# Patient Record
Sex: Male | Born: 1950 | Race: Black or African American | Hispanic: No | Smoking: Former smoker
Health system: Southern US, Community
[De-identification: ages and names within clinical notes are randomized; demographics above are authoritative.]

## PROBLEM LIST (undated history)

## (undated) DIAGNOSIS — N400 Enlarged prostate without lower urinary tract symptoms: Secondary | ICD-10-CM

## (undated) DIAGNOSIS — F32A Depression, unspecified: Secondary | ICD-10-CM

## (undated) DIAGNOSIS — I10 Essential (primary) hypertension: Secondary | ICD-10-CM

## (undated) DIAGNOSIS — R0602 Shortness of breath: Secondary | ICD-10-CM

## (undated) DIAGNOSIS — F99 Mental disorder, not otherwise specified: Secondary | ICD-10-CM

## (undated) DIAGNOSIS — F1410 Cocaine abuse, uncomplicated: Secondary | ICD-10-CM

## (undated) DIAGNOSIS — R32 Unspecified urinary incontinence: Secondary | ICD-10-CM

## (undated) DIAGNOSIS — Z5189 Encounter for other specified aftercare: Secondary | ICD-10-CM

## (undated) DIAGNOSIS — Z87448 Personal history of other diseases of urinary system: Secondary | ICD-10-CM

## (undated) DIAGNOSIS — F319 Bipolar disorder, unspecified: Secondary | ICD-10-CM

## (undated) DIAGNOSIS — F329 Major depressive disorder, single episode, unspecified: Secondary | ICD-10-CM

## (undated) DIAGNOSIS — I639 Cerebral infarction, unspecified: Secondary | ICD-10-CM

## (undated) DIAGNOSIS — F419 Anxiety disorder, unspecified: Secondary | ICD-10-CM

## (undated) HISTORY — PX: INCISION AND DRAINAGE OF WOUND: SHX1803

## (undated) HISTORY — PX: INGUINAL HERNIA REPAIR: SUR1180

## (undated) HISTORY — PX: TONSILLECTOMY: SUR1361

---

## 1969-03-30 DIAGNOSIS — Z5189 Encounter for other specified aftercare: Secondary | ICD-10-CM

## 1969-03-30 DIAGNOSIS — IMO0001 Reserved for inherently not codable concepts without codable children: Secondary | ICD-10-CM

## 1969-03-30 HISTORY — DX: Reserved for inherently not codable concepts without codable children: IMO0001

## 1969-03-30 HISTORY — DX: Encounter for other specified aftercare: Z51.89

## 1998-02-01 ENCOUNTER — Emergency Department (HOSPITAL_COMMUNITY): Admission: EM | Admit: 1998-02-01 | Discharge: 1998-02-01 | Payer: Self-pay | Admitting: Emergency Medicine

## 1998-03-30 ENCOUNTER — Emergency Department (HOSPITAL_COMMUNITY): Admission: EM | Admit: 1998-03-30 | Discharge: 1998-03-31 | Payer: Self-pay

## 2001-02-09 ENCOUNTER — Emergency Department (HOSPITAL_COMMUNITY): Admission: EM | Admit: 2001-02-09 | Discharge: 2001-02-09 | Payer: Self-pay

## 2004-04-01 ENCOUNTER — Inpatient Hospital Stay (HOSPITAL_COMMUNITY): Admission: AD | Admit: 2004-04-01 | Discharge: 2004-04-05 | Payer: Self-pay

## 2004-04-01 ENCOUNTER — Encounter: Payer: Self-pay | Admitting: Emergency Medicine

## 2004-04-05 ENCOUNTER — Ambulatory Visit: Payer: Self-pay | Admitting: Physical Medicine & Rehabilitation

## 2004-04-05 ENCOUNTER — Inpatient Hospital Stay (HOSPITAL_COMMUNITY)
Admission: RE | Admit: 2004-04-05 | Discharge: 2004-04-12 | Payer: Self-pay | Admitting: Physical Medicine & Rehabilitation

## 2006-02-09 ENCOUNTER — Emergency Department (HOSPITAL_COMMUNITY): Admission: EM | Admit: 2006-02-09 | Discharge: 2006-02-09 | Payer: Self-pay | Admitting: Emergency Medicine

## 2006-02-15 ENCOUNTER — Emergency Department (HOSPITAL_COMMUNITY): Admission: EM | Admit: 2006-02-15 | Discharge: 2006-02-15 | Payer: Self-pay | Admitting: Family Medicine

## 2007-05-02 ENCOUNTER — Encounter (INDEPENDENT_AMBULATORY_CARE_PROVIDER_SITE_OTHER): Payer: Self-pay | Admitting: *Deleted

## 2007-05-02 ENCOUNTER — Inpatient Hospital Stay (HOSPITAL_COMMUNITY): Admission: EM | Admit: 2007-05-02 | Discharge: 2007-05-06 | Payer: Self-pay | Admitting: Emergency Medicine

## 2007-06-29 ENCOUNTER — Observation Stay (HOSPITAL_COMMUNITY): Admission: EM | Admit: 2007-06-29 | Discharge: 2007-07-01 | Payer: Self-pay | Admitting: *Deleted

## 2009-08-22 ENCOUNTER — Inpatient Hospital Stay (HOSPITAL_COMMUNITY): Admission: EM | Admit: 2009-08-22 | Discharge: 2009-08-25 | Payer: Self-pay | Admitting: Emergency Medicine

## 2010-10-15 LAB — CK TOTAL AND CKMB (NOT AT ARMC)
CK, MB: 2.6 ng/mL (ref 0.3–4.0)
CK, MB: 3.1 ng/mL (ref 0.3–4.0)
CK, MB: 3.3 ng/mL (ref 0.3–4.0)
Relative Index: 1.6 (ref 0.0–2.5)
Total CK: 162 U/L (ref 7–232)
Total CK: 166 U/L (ref 7–232)
Total CK: 191 U/L (ref 7–232)
Total CK: 195 U/L (ref 7–232)

## 2010-10-15 LAB — CBC
HCT: 42.6 % (ref 39.0–52.0)
RBC: 4.88 MIL/uL (ref 4.22–5.81)

## 2010-10-15 LAB — TROPONIN I
Troponin I: 0.01 ng/mL (ref 0.00–0.06)
Troponin I: 0.02 ng/mL (ref 0.00–0.06)

## 2010-10-15 LAB — COMPREHENSIVE METABOLIC PANEL
ALT: 14 U/L (ref 0–53)
AST: 17 U/L (ref 0–37)
Alkaline Phosphatase: 73 U/L (ref 39–117)
Alkaline Phosphatase: 75 U/L (ref 39–117)
BUN: 24 mg/dL — ABNORMAL HIGH (ref 6–23)
CO2: 22 mEq/L (ref 19–32)
CO2: 23 mEq/L (ref 19–32)
Calcium: 8.5 mg/dL (ref 8.4–10.5)
Calcium: 8.8 mg/dL (ref 8.4–10.5)
Chloride: 105 mEq/L (ref 96–112)
GFR calc Af Amer: >60 mL/min (ref >60)
GFR calc non Af Amer: >60 mL/min (ref >60)
GFR calc non Af Amer: >60 mL/min (ref >60)
Glucose, Bld: 82 mg/dL (ref 70–99)
Glucose, Bld: 90 mg/dL (ref 70–99)
Potassium: 3.8 mEq/L (ref 3.5–5.1)
Sodium: 137 mEq/L (ref 135–145)
Total Protein: 7.3 g/dL (ref 6.0–8.3)

## 2010-10-15 LAB — DIFFERENTIAL
Basophils Relative: 1 % (ref 0–1)
Eosinophils Absolute: 0.2 10*3/uL (ref 0.0–0.7)
Monocytes Relative: 7 % (ref 3–12)
Neutro Abs: 3.4 10*3/uL (ref 1.7–7.7)
Neutrophils Relative %: 44 % (ref 43–77)

## 2010-10-15 LAB — PROTIME-INR
INR: 1.03 (ref 0.00–1.49)
Prothrombin Time: 13.4 seconds (ref 11.6–15.2)

## 2010-10-15 LAB — URINALYSIS, ROUTINE W REFLEX MICROSCOPIC
Nitrite: NEGATIVE
Specific Gravity, Urine: 1.029 (ref 1.005–1.030)
Urobilinogen, UA: 1 mg/dL (ref 0.0–1.0)
pH: 6 (ref 5.0–8.0)

## 2010-10-15 LAB — BASIC METABOLIC PANEL
BUN: 15 mg/dL (ref 6–23)
CO2: 27 mEq/L (ref 19–32)
Calcium: 8.9 mg/dL (ref 8.4–10.5)
Chloride: 106 mEq/L (ref 96–112)
Creatinine, Ser: 1.08 mg/dL (ref 0.4–1.5)

## 2010-10-15 LAB — RAPID URINE DRUG SCREEN, HOSP PERFORMED
Amphetamines: NOT DETECTED
Opiates: NOT DETECTED
Tetrahydrocannabinol: NOT DETECTED

## 2010-10-15 LAB — LIPID PANEL
Triglycerides: 93 mg/dL (ref <150)
VLDL: 19 mg/dL (ref 0–40)

## 2010-10-15 LAB — APTT: aPTT: 31 seconds (ref 24–37)
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
## 2010-12-12 NOTE — Discharge Summary (Signed)
NAME:  Dominic Pineda, Dominic Pineda               ACCOUNT NO.:  [id number]   MEDICAL RECORD NO.:  [id number]          PATIENT TYPE:  OBV   LOCATION:  5509                         FACILITY:  [facility]   PHYSICIAN:  Beckey Rutter, MD  DATE OF BIRTH:  1951-03-21   DATE OF ADMISSION:  06/29/2007  DATE OF DISCHARGE:  07/01/2007                               DISCHARGE SUMMARY   PRIMARY CARE PHYSICIAN:  The patient is unassigned to [facility].   CHIEF COMPLAINT ON PRESENTATION:  Confusion, altered mental status.   HISTORY OF PRESENT ILLNESS:  Please refer to the H&P dictated on the day  of admission.   DISCHARGE DIAGNOSIS:  Altered mental status/confusion secondary to  cocaine/crack abuse.   SECONDARY DIAGNOSES:  1. 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  2. Hypertension.  3. Dyslipidemia.  4. Positive lupus anticoagulant.  5. Bipolar disease.  6. History of polytrauma.  7. A 5-mm right anterior cerebral artery aneurysm.   HOSPITAL COURSE:  During hospitalization the patient was monitored with  improvement to his mental status.  Today, the patient is alert,  oriented.  He is not suicidal.  He denied any idea of hurting others or  himself.  His condition was discussed with his friend in the room after  his permission, also was discussed with his sister and his mom on the  phone, number [phone], in regards to his complication after using crack  cocaine.  The family is agreeable and aware of the discharge plan.  The  patient will be discharged today to follow up with his psychiatrist in  [facility] system.   DISCHARGE MEDICATIONS:  1. Aspirin 325 mg daily.  2. Benazepril 20 mg daily.  3. Verapamil 240 mg daily.  4. Zocor 40 mg at bedtime.  5. Effexor 75 mg daily.  6. Tramadol 50 mg t.i.d.   HOSPITAL PROCEDURES:  1. The patient had CT chest without contrast, the impression reading      no intracranial hemorrhage, no mass effect or midline shift,      chronic  infarct change noted in the right MCA territory.  2. Chest x-ray done on June 29, 2007, impression reading low      volumes with vascular crowding and atelectasis.  No edema,      infiltrate or effusion.  3. The patient underwent MRI, impression reading is:      a.     Expected interval evaluation of right MCA infarct without       evidence of hemorrhage or mass effect.      b.     No definite acute ischemia.  Stable chronic small-vessel       disease.      c.     Flow in the right MCA maybe improved since the prior exam on       the basis of T2 follow voids.  Dedicated MRI was not performed.   DISCHARGE PLAN:  Again, the patient was discharged to follow up with his  psychiatrist.  He has problems with compliance.  I tried to engage the  family to  further improve his compliance.      Beckey Rutter, MD  Electronically Signed     EME/MEDQ  D:  07/01/2007  T:  07/01/2007  Job:  [id number]
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
## 2010-12-12 NOTE — H&P (Signed)
NAME:  OSMIN, WELZ               ACCOUNT NO.:  [id number]   MEDICAL RECORD NO.:  [id number]          PATIENT TYPE:  INP   LOCATION:  1827                         FACILITY:  [facility]   PHYSICIAN:  Bevelyn Buckles. Champey, M.D.DATE OF BIRTH:  06-18-1951   DATE OF ADMISSION:  05/02/2007  DATE OF DISCHARGE:                              HISTORY & PHYSICAL   NEUROLOGY STROKE ADMIT NOTE   REASON FOR ADMISSION:  Code stroke.   HISTORY OF PRESENT ILLNESS:  Mr. Bearse is a 60 year old African  American male with multiple medical problems initially presented for two  syncopal-like events. The last event was at 3:45 this morning.  He was  also found to be orthostatic.  His neuro examination was normal at that  time per the emergency room doctor.  The patient came less responsive,  went to  CT scan and when he came  there from the CT scan around 4:15  this morning he had right gaze deviation and left hemiparesis.  Code  Stroke was called.  The patient complained of left-sided headache as  well.  Initially hemiparesis was dense on the left and right gaze  deviation.  The patient denies any swallowing problems, chewing  problems, vertigo or dizziness.   PAST MEDICAL HISTORY:  Positive for hypertension, bipolar, high  cholesterol, back pain.   CURRENT MEDICATIONS:  Glyburide, Ultram, Tramadol, potassium chloride,  Zocor and multivitamin.   ALLERGIES:  To CODEINE.   FAMILY HISTORY:  Is positive for heart disease.   SOCIAL HISTORY:  The patient lives with his sister and brother denies  any smoking.  Does use crack cocaine occasionally, last use was  yesterday, does drink 2 alcohol drinks per day.   REVIEW OF SYSTEMS:  Positive as per HPI.  Review of systems negative as  per HPI in greater than eight other organ system.   EXAMINATION:  Vital Signs: Temperature is 96.8, blood pressure 187/99,  pulse 80, respirations 20, O2 sat is 95-99%.  HEENT: Normocephalic, atraumatic.  The patient has a right  gaze  preference however does cross midline.  He has a left visual field cut.  NECK is supple.  No carotid bruits.  HEART:  Regular.  LUNGS: Clear.  ABDOMEN: Soft.  EXTREMITIES:  Show good pulses.  NEUROLOGICAL EXAMINATION:  The patient is awake yet drowsy.  Language is  dysarthric.  The patient is following commands appropriately.  Cranial  Nerves: The patient has a left facial droop, right gaze preference,  however does cross midline.  He has a left visual field cut.  Motor  examination the patient has mild hemiparesis on the left with a mild  drift.  Sensory examination: The patient has neglect and extinction the  left and decreased sensation on the left when compared to the right.  Reflexes are 1+ throughout.  Toes are neutral bilaterally.  Cerebellar  function: No  ataxia is noted.  Gait is not assessed secondary to  safety.  NIH stroke scale was nine.  CT of the head showed no acute  bleed or infarct.   LABORATORY DATA:  WBC is 10.5, hemoglobin  12.9, hematocrit 37.8,  platelets 250.  PT is 14.4, INR is 1.0, PTT is 33.  Sodium is 139,  potassium is 3.3, chloride is 104, CO2 26, BUN 16, creatinine is 1.27,  glucose 154, AST 35, ALT is 17.   IMPRESSION:  This is a 60 year old [place] American male who initially  presented with syncope and normal neurological examination.  He then  developed acute onset of left hemiparesis and symptoms as above,  suggesting a right hemispheric infarct.  The patient did meet criteria  for IV t-PA and full-dose was given.  The patient was admitted to stroke  MD ICU.  We will get an MRI/MRA of the brain, CT scan in 24 hours and 2-  D echocardiogram, carotid Dopplers.  Check fasting homocysteine levels,  will get PT, OT and speech consults.  Will continue his home  medications.  The patient will need to start an antiplatelet 24 hours  after t-PA.  We will monitor his blood pressure and neuro checks  frequently, keep n.p.o. until he clears swallow  evaluation.      Bevelyn Buckles. Nash Shearer, M.D.  Electronically Signed     DRC/MEDQ  D:  05/02/2007  T:  05/02/2007  Job:  [id number]

## 2010-12-12 NOTE — Op Note (Signed)
Dominic Pineda, Dominic Pineda               ACCOUNT NO.:  [id number]   MEDICAL RECORD NO.:  [id number]          PATIENT TYPE:  INP   LOCATION:  3113                         FACILITY:  [facility]   PHYSICIAN:  Dani Gobble, MD       DATE OF BIRTH:  May 10, 1951   DATE OF PROCEDURE:  05/05/2007  DATE OF DISCHARGE:                               OPERATIVE REPORT   PROCEDURE:  Transesophageal echocardiogram.   SURGEON:  Dr. Domingo Sep.   INDICATIONS:  Stroke.   COMPLICATIONS:  Include the possibility of aspiration.  See procedure  readout for details.   PROCEDURE IN DETAIL:  After oral lidocaine (spray and gel), the patient  was sedated without difficulty with 4 mg of IV Versed and 25 mcg of IV  fentanyl with good initial results.  However, on intubation, the patient  awakened and began to cough fairly briskly.  He was suctioned  immediately, and as the oxygen saturation monitor __________ the probe  was removed promptly.  He had a strong cough.  Two additional milligrams  of IV Versed were given; however, I cannot exclude the possibility of  aspiration during this time frame.  Once his __________ cough subsided  and the patient was further sedated, he was intubated with the probe  without further difficulty.  He did have a approximately 75 mL of  secretion suctioned during the procedure.  He was awake and alert at the  end of the procedure.   RESULTS:  1. Left atrium was mild to moderately dilated without clot  or smoke      noted.  2. The left atrial appendage was moderate in size with no clot or      smoke noted.  The __________  were approximately 40 cm per second.  3. The right atrium was mildly dilated without clot or smoke noted.  4. Inner atrial septal appeared to be intact, and contrast study was      negative for PFO or AST.  5. The left ventricle is normal in size and function.  6. The right ventricle was also normal in size and function.  7. The mitral valve was structurally normal  with trace to  mild MR.  8. Tricuspid valve was structurally normal.  9. The aortic valve was trileaflet and pliable without aortic      insufficiency  noted.  10.The pulmonic valve was not well seen.  11.The aorta exhibited mild grade I atheroma  without complex plaque      appreciated.  No obvious masses or clots were noted.           ______________________________  Dani Gobble, MD     AB/MEDQ  D:  05/05/2007  T:  05/05/2007  Job:  [id number]
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
## 2011-05-07 LAB — BASIC METABOLIC PANEL
CO2: 25
Calcium: 8.9
Creatinine, Ser: 1.29
GFR calc Af Amer: >60
GFR calc non Af Amer: 57 — ABNORMAL LOW
Glucose, Bld: 102 — ABNORMAL HIGH

## 2011-05-07 LAB — CBC
MCHC: 34.8
Platelets: 292
RDW: 15.3

## 2011-05-08 LAB — DIFFERENTIAL
Lymphocytes Relative: 37
Lymphs Abs: 3.4
Monocytes Relative: 9
Neutro Abs: 4.8
Neutrophils Relative %: 52

## 2011-05-08 LAB — COMPREHENSIVE METABOLIC PANEL
ALT: 21
BUN: 13
CO2: 19
Calcium: 9.3
Chloride: 108
Creatinine, Ser: 1.29
GFR calc Af Amer: >60
GFR calc non Af Amer: 57 — ABNORMAL LOW

## 2011-05-08 LAB — URINALYSIS, ROUTINE W REFLEX MICROSCOPIC
Bilirubin Urine: NEGATIVE
Glucose, UA: NEGATIVE
Hgb urine dipstick: NEGATIVE
Nitrite: NEGATIVE
Specific Gravity, Urine: 1.02
pH: 6

## 2011-05-08 LAB — ETHANOL: Alcohol, Ethyl (B): <5

## 2011-05-08 LAB — RAPID URINE DRUG SCREEN, HOSP PERFORMED
Cocaine: POSITIVE — AB
Opiates: NOT DETECTED

## 2011-05-08 LAB — CBC
HCT: 41.3
Hemoglobin: 14.1
Platelets: 310
RBC: 4.8
WBC: 9.1

## 2011-05-08 LAB — CK TOTAL AND CKMB (NOT AT ARMC)
CK, MB: 6.7 — ABNORMAL HIGH
Relative Index: 1
Total CK: 652 — ABNORMAL HIGH

## 2011-05-08 LAB — TROPONIN I: Troponin I: 0.03

## 2011-05-10 LAB — RAPID URINE DRUG SCREEN, HOSP PERFORMED
Barbiturates: NOT DETECTED
Benzodiazepines: NOT DETECTED

## 2011-05-10 LAB — CK TOTAL AND CKMB (NOT AT ARMC)
CK, MB: 9.8 — ABNORMAL HIGH
Relative Index: 0.6
Total CK: 1638 — ABNORMAL HIGH

## 2011-05-10 LAB — I-STAT 8, (EC8 V) (CONVERTED LAB)
Acid-Base Excess: 2
BUN: 17
Bicarbonate: 25.3 — ABNORMAL HIGH
Chloride: 105
Glucose, Bld: 139 — ABNORMAL HIGH
HCT: 43
Hemoglobin: 14.6
Operator id: 277751
Potassium: 3.9
Sodium: 137
TCO2: 26
pCO2, Ven: 34.5 — ABNORMAL LOW
pH, Ven: 7.474 — ABNORMAL HIGH

## 2011-05-10 LAB — DIFFERENTIAL
Basophils Absolute: 0
Basophils Absolute: 0
Basophils Relative: 0
Basophils Relative: 0
Eosinophils Absolute: 0
Eosinophils Absolute: 0.1
Eosinophils Relative: 0
Eosinophils Relative: 1
Lymphocytes Relative: 24
Lymphocytes Relative: 25
Lymphs Abs: 1.9
Lymphs Abs: 2.6
Monocytes Absolute: 0.5
Monocytes Absolute: 0.7
Monocytes Relative: 6
Monocytes Relative: 7
Neutro Abs: 5.6
Neutro Abs: 7.1
Neutrophils Relative %: 68
Neutrophils Relative %: 70

## 2011-05-10 LAB — COMPREHENSIVE METABOLIC PANEL
ALT: 11
AST: 35
Albumin: 3.6
BUN: 16
BUN: 16
Calcium: 8.8
Creatinine, Ser: 1.27
GFR calc Af Amer: >60
Glucose, Bld: 149 — ABNORMAL HIGH
Potassium: 3.3 — ABNORMAL LOW
Sodium: 137
Total Protein: 7.1
Total Protein: 7.2

## 2011-05-10 LAB — CBC
HCT: 28 — ABNORMAL LOW
HCT: 37 — ABNORMAL LOW
HCT: 37.8 — ABNORMAL LOW
Hemoglobin: 9.5 — ABNORMAL LOW
MCHC: 33.9
MCV: 83.7
MCV: 85.3
Platelets: 183
Platelets: 250
Platelets: 250
RBC: 3.29 — ABNORMAL LOW
RDW: 14.5 — ABNORMAL HIGH
RDW: 14.7 — ABNORMAL HIGH
WBC: 10.5
WBC: 8
WBC: 9.9

## 2011-05-10 LAB — URINALYSIS, ROUTINE W REFLEX MICROSCOPIC
Glucose, UA: NEGATIVE
Hgb urine dipstick: NEGATIVE
Ketones, ur: 15 — AB
Nitrite: NEGATIVE
Protein, ur: NEGATIVE
Specific Gravity, Urine: 1.023
Urobilinogen, UA: 1
pH: 5

## 2011-05-10 LAB — ANTITHROMBIN III: AntiThromb III Func: 110 (ref 76–126)

## 2011-05-10 LAB — POCT CARDIAC MARKERS
CKMB, poc: 6.4
CKMB, poc: 7.7
Myoglobin, poc: 387
Myoglobin, poc: >500
Operator id: 277751
Operator id: 277751
Troponin i, poc: <0.05
Troponin i, poc: <0.05

## 2011-05-10 LAB — ACETAMINOPHEN LEVEL: Acetaminophen (Tylenol), Serum: <10 — ABNORMAL LOW

## 2011-05-10 LAB — APTT
aPTT: 33
aPTT: 39 — ABNORMAL HIGH

## 2011-05-10 LAB — D-DIMER, QUANTITATIVE: D-Dimer, Quant: 0.24

## 2011-05-10 LAB — LUPUS ANTICOAGULANT PANEL: Lupus Anticoagulant: DETECTED — AB

## 2011-05-10 LAB — LIPID PANEL
Cholesterol: 214 — ABNORMAL HIGH
HDL: 30 — ABNORMAL LOW
LDL Cholesterol: 154 — ABNORMAL HIGH
Triglycerides: 149

## 2011-05-10 LAB — FACTOR 5 LEIDEN

## 2011-05-10 LAB — BETA-2-GLYCOPROTEIN I ABS, IGG/M/A
Beta-2 Glyco I IgG: <4 U/mL (ref <20)
Beta-2-Glycoprotein I IgA: <4 U/mL (ref <10)
Beta-2-Glycoprotein I IgM: <4 U/mL (ref <10)

## 2011-05-10 LAB — TRICYCLICS SCREEN, URINE: TCA Scrn: NOT DETECTED

## 2011-05-10 LAB — POCT I-STAT CREATININE
Creatinine, Ser: 1.3
Operator id: 277751

## 2011-05-10 LAB — PROTIME-INR
INR: 1.1
INR: 1.9 — ABNORMAL HIGH
Prothrombin Time: 14.4
Prothrombin Time: 21.9 — ABNORMAL HIGH

## 2011-05-10 LAB — TROPONIN I: Troponin I: 0.03

## 2011-05-10 LAB — PROTEIN S ACTIVITY: Protein S Activity: 106 % (ref 69–129)

## 2011-05-10 LAB — ETHANOL: Alcohol, Ethyl (B): <5

## 2011-05-10 LAB — PROTEIN S, TOTAL: Protein S Ag, Total: 146 % — ABNORMAL HIGH (ref 70–140)

## 2011-05-10 LAB — SALICYLATE LEVEL: Salicylate Lvl: <4

## 2011-08-31 DIAGNOSIS — Z87448 Personal history of other diseases of urinary system: Secondary | ICD-10-CM

## 2011-08-31 HISTORY — DX: Personal history of other diseases of urinary system: Z87.448

## 2011-09-22 ENCOUNTER — Inpatient Hospital Stay (HOSPITAL_COMMUNITY)
Admission: EM | Admit: 2011-09-22 | Discharge: 2011-09-28 | DRG: 684 | Disposition: A | Discharge status: Skilled Nursing Facility | Payer: Medicare Other | Attending: Internal Medicine | Admitting: Internal Medicine

## 2011-09-22 ENCOUNTER — Other Ambulatory Visit: Payer: Self-pay

## 2011-09-22 ENCOUNTER — Encounter (HOSPITAL_COMMUNITY): Payer: Self-pay | Admitting: Emergency Medicine

## 2011-09-22 DIAGNOSIS — N19 Unspecified kidney failure: Secondary | ICD-10-CM

## 2011-09-22 DIAGNOSIS — R3589 Other polyuria: Secondary | ICD-10-CM | POA: Diagnosis present

## 2011-09-22 DIAGNOSIS — M6281 Muscle weakness (generalized): Secondary | ICD-10-CM | POA: Diagnosis present

## 2011-09-22 DIAGNOSIS — R112 Nausea with vomiting, unspecified: Secondary | ICD-10-CM | POA: Diagnosis present

## 2011-09-22 DIAGNOSIS — F1411 Cocaine abuse, in remission: Secondary | ICD-10-CM | POA: Diagnosis present

## 2011-09-22 DIAGNOSIS — Z886 Allergy status to analgesic agent status: Secondary | ICD-10-CM

## 2011-09-22 DIAGNOSIS — F319 Bipolar disorder, unspecified: Secondary | ICD-10-CM | POA: Diagnosis present

## 2011-09-22 DIAGNOSIS — R358 Other polyuria: Secondary | ICD-10-CM | POA: Diagnosis present

## 2011-09-22 DIAGNOSIS — R319 Hematuria, unspecified: Secondary | ICD-10-CM | POA: Diagnosis present

## 2011-09-22 DIAGNOSIS — I10 Essential (primary) hypertension: Secondary | ICD-10-CM | POA: Diagnosis present

## 2011-09-22 DIAGNOSIS — N179 Acute kidney failure, unspecified: Principal | ICD-10-CM | POA: Diagnosis present

## 2011-09-22 DIAGNOSIS — E875 Hyperkalemia: Secondary | ICD-10-CM | POA: Diagnosis present

## 2011-09-22 DIAGNOSIS — N139 Obstructive and reflux uropathy, unspecified: Secondary | ICD-10-CM | POA: Diagnosis present

## 2011-09-22 DIAGNOSIS — R197 Diarrhea, unspecified: Secondary | ICD-10-CM | POA: Diagnosis present

## 2011-09-22 DIAGNOSIS — R531 Weakness: Secondary | ICD-10-CM | POA: Diagnosis present

## 2011-09-22 DIAGNOSIS — I161 Hypertensive emergency: Secondary | ICD-10-CM | POA: Diagnosis present

## 2011-09-22 DIAGNOSIS — Z79899 Other long term (current) drug therapy: Secondary | ICD-10-CM

## 2011-09-22 DIAGNOSIS — Z8249 Family history of ischemic heart disease and other diseases of the circulatory system: Secondary | ICD-10-CM

## 2011-09-22 DIAGNOSIS — R2689 Other abnormalities of gait and mobility: Secondary | ICD-10-CM | POA: Diagnosis present

## 2011-09-22 HISTORY — DX: Unspecified urinary incontinence: R32

## 2011-09-22 HISTORY — DX: Depression, unspecified: F32.A

## 2011-09-22 HISTORY — DX: Bipolar disorder, unspecified: F31.9

## 2011-09-22 HISTORY — DX: Anxiety disorder, unspecified: F41.9

## 2011-09-22 HISTORY — DX: Shortness of breath: R06.02

## 2011-09-22 HISTORY — DX: Cerebral infarction, unspecified: I63.9

## 2011-09-22 HISTORY — DX: Essential (primary) hypertension: I10

## 2011-09-22 HISTORY — DX: Encounter for other specified aftercare: Z51.89

## 2011-09-22 HISTORY — DX: Major depressive disorder, single episode, unspecified: F32.9

## 2011-09-22 HISTORY — DX: Mental disorder, not otherwise specified: F99

## 2011-09-22 LAB — CBC
HCT: 36.3 % — ABNORMAL LOW (ref 39.0–52.0)
MCV: 85.8 fL (ref 78.0–100.0)
Platelets: 320 10*3/uL (ref 150–400)
RBC: 4.23 MIL/uL (ref 4.22–5.81)
WBC: 11.4 10*3/uL — ABNORMAL HIGH (ref 4.0–10.5)

## 2011-09-22 LAB — DIFFERENTIAL
Basophils Absolute: 0.1 10*3/uL (ref 0.0–0.1)
Eosinophils Relative: 3 % (ref 0–5)
Lymphocytes Relative: 31 % (ref 12–46)
Lymphs Abs: 3.5 10*3/uL (ref 0.7–4.0)
Monocytes Absolute: 0.8 10*3/uL (ref 0.1–1.0)
Neutro Abs: 6.7 10*3/uL (ref 1.7–7.7)

## 2011-09-22 LAB — BASIC METABOLIC PANEL
CO2: 21 mEq/L (ref 19–32)
Calcium: 8.7 mg/dL (ref 8.4–10.5)
Chloride: 99 mEq/L (ref 96–112)
Glucose, Bld: 106 mg/dL — ABNORMAL HIGH (ref 70–99)
Sodium: 135 mEq/L (ref 135–145)

## 2011-09-22 MED ORDER — VERAPAMIL HCL 120 MG PO TABS
120.0000 mg | ORAL_TABLET | Freq: Once | ORAL | Status: AC
  Administered 2011-09-22: 120 mg via ORAL
  Filled 2011-09-22: qty 1

## 2011-09-22 MED ORDER — SODIUM CHLORIDE 0.9 % IV BOLUS (SEPSIS)
1000.0000 mL | Freq: Once | INTRAVENOUS | Status: AC
  Administered 2011-09-22: 1000 mL via INTRAVENOUS

## 2011-09-22 MED ORDER — INSULIN ASPART 100 UNIT/ML ~~LOC~~ SOLN
10.0000 [IU] | Freq: Once | SUBCUTANEOUS | Status: AC
  Administered 2011-09-23: 10 [IU] via SUBCUTANEOUS
  Filled 2011-09-22: qty 1

## 2011-09-22 MED ORDER — DEXTROSE 50 % IV SOLN
1.0000 | Freq: Once | INTRAVENOUS | Status: AC
  Administered 2011-09-23: 50 mL via INTRAVENOUS
  Filled 2011-09-22: qty 50

## 2011-09-22 MED ORDER — HYDRALAZINE HCL 20 MG/ML IJ SOLN
10.0000 mg | Freq: Once | INTRAMUSCULAR | Status: AC
  Administered 2011-09-23: 10 mg via INTRAVENOUS
  Filled 2011-09-22: qty 0.5

## 2011-09-22 NOTE — ED Notes (Signed)
Pt states he has not been able to control his bowel or bladder for 3 days.  States hx of incontinence for 6 months, says the past three days have been different.  But is unable to state why.  Pt is a poor historian.  Does not seem concerned about blood pressure.  When asked how his family knew that his blood pressure was high to call 911, he stated that the paramedics checked it.  States he has not been out of bed for 3 days but then stated that in fact he had been out of bed.

## 2011-09-22 NOTE — ED Notes (Signed)
Per EMS: Pt w/ hx of HTN has not been taking his verapamil since Tuesday.  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  Was seen at [facility] on Tuesday.  Has been having diarrhea for 3 days.  No neuro deficits.  Denies vision changes, no pain.  CBG 104.  188/110 at this time.

## 2011-09-22 NOTE — ED Provider Notes (Signed)
History     CSN: [id number]  Arrival date & time 09/22/11  1949   First MD Initiated Contact with Patient 09/22/11 2008      Chief Complaint  Patient presents with  . Hypertension  . Diarrhea     HPI  History provided by the patient. Patient 61-year-old male with history of hypertension and bipolar disorder who presents with complaints of elevated blood pressure today. Patient reports being out of his normal verapamil medication for blood pressure for the past several days. Patient has not checked his blood pressure since last Tuesday but states it has never been this high. He reports having systolic pressures in the 190s at home. Patient does report having some loose soft "diarrhea"-like stools for the past 3 days. He reports having soft stool he first wakes up and a second stool later in the afternoon. He denies frequent diarrhea. She was on an antibiotic 2 weeks ago for a skin infection. He does not recall the name of the antibiotic that time. He denies any fever, chills, sweats, nausea or vomiting. He denies any chest pain, shortness of breath heart palpitations or near syncope. He denies any dysuria, hematuria or urinary frequency. Patient has no other complaints.    Past Medical History  Diagnosis Date  . Hypertension   . Bipolar 1 disorder   . Incontinence     No past surgical history on file.  No family history on file.  History  Substance Use Topics  . Smoking status: Not on file  . Smokeless tobacco: Not on file  . Alcohol Use:       Review of Systems  Constitutional: Negative for fever, chills and appetite change.  Respiratory: Negative for cough.   Cardiovascular: Negative for chest pain.  Gastrointestinal: Positive for diarrhea. Negative for nausea, vomiting and abdominal pain.  All other systems reviewed and are negative.    Allergies  Codeine  Home Medications   Current Outpatient Rx  Name Route Sig Dispense Refill  . ACETAMINOPHEN 325 MG PO TABS  Oral Take 650 mg by mouth 2 (two) times daily as needed. For pain.    Marland Kitchen MULTI-VITAMIN/MINERALS PO TABS Oral Take 1 tablet by mouth daily.    . EFFEXOR PO Oral Take 1 tablet by mouth daily.    Marland Kitchen VERAPAMIL HCL 120 MG PO TABS Oral Take 120 mg by mouth daily.      BP 188/110  Pulse 89  Temp(Src) 97.4 F (36.3 C) (Oral)  Resp 16  SpO2 100%  Physical Exam  Nursing note and vitals reviewed. Constitutional: He is oriented to person, place, and time. He appears well-developed and well-nourished. No distress.  HENT:  Head: Normocephalic and atraumatic.  Cardiovascular: Normal rate and regular rhythm.   Pulmonary/Chest: Effort normal and breath sounds normal. No respiratory distress. He has no wheezes. He has no rales.  Abdominal: Soft. He exhibits no distension. There is no tenderness. There is no rebound.  Neurological: He is alert and oriented to person, place, and time.  Skin: Skin is warm. No rash noted.  Psychiatric: He has a normal mood and affect. His behavior is normal.    ED Course  Procedures   Results for orders placed during the hospital encounter of 09/22/11  CBC      Component Value Range   WBC 11.4 (*) 4.0 - 10.5 (K/uL)   RBC 4.23  4.22 - 5.81 (MIL/uL)   Hemoglobin 12.2 (*) 13.0 - 17.0 (g/dL)   HCT 14.7 (*) 82.9 -  52.0 (%)   MCV 85.8  78.0 - 100.0 (fL)   MCH 28.8  26.0 - 34.0 (pg)   MCHC 33.6  30.0 - 36.0 (g/dL)   RDW 16.1  09.6 - 04.5 (%)   Platelets 320  150 - 400 (K/uL)  DIFFERENTIAL      Component Value Range   Neutrophils Relative 59  43 - 77 (%)   Neutro Abs 6.7  1.7 - 7.7 (K/uL)   Lymphocytes Relative 31  12 - 46 (%)   Lymphs Abs 3.5  0.7 - 4.0 (K/uL)   Monocytes Relative 7  3 - 12 (%)   Monocytes Absolute 0.8  0.1 - 1.0 (K/uL)   Eosinophils Relative 3  0 - 5 (%)   Eosinophils Absolute 0.3  0.0 - 0.7 (K/uL)   Basophils Relative 1  0 - 1 (%)   Basophils Absolute 0.1  0.0 - 0.1 (K/uL)  BASIC METABOLIC PANEL      Component Value Range   Sodium 135  135  - 145 (mEq/L)   Potassium 5.6 (*) 3.5 - 5.1 (mEq/L)   Chloride 99  96 - 112 (mEq/L)   CO2 21  19 - 32 (mEq/L)   Glucose, Bld 106 (*) 70 - 99 (mg/dL)   BUN 76 (*) 6 - 23 (mg/dL)   Creatinine, Ser 40.98 (*) 0.50 - 1.35 (mg/dL)   Calcium 8.7  8.4 - 11.9 (mg/dL)   GFR calc non Af Amer 4 (*) >90 (mL/min)   GFR calc Af Amer 5 (*) >90 (mL/min)  GLUCOSE, CAPILLARY      Component Value Range   Glucose-Capillary 131 (*) 70 - 99 (mg/dL)   Comment 1 Documented in Chart     Comment 2 Notify RN         1. Renal failure   2. Hypertension   3. Diarrhea       MDM  8:10PM patient seen and evaluated. Patient in no acute distress.  10:00 PM patient seen and discussed with attending physician. Will obtain EKG to evaluate for elevated potassium. At this time we'll wait on any treatments. Patient does have signs for renal failure and we'll plan for admission. Will begin increased fluid boluses.  11:30 PM spoke with [facility]ad hospitalist. They will see patient and admit. They would like treatment for elevated potassium and continue treatment for elevated blood pressure.     Date: 09/22/2011  Rate: 85  Rhythm: normal sinus rhythm  QRS Axis: normal  Intervals: normal  ST/T Wave abnormalities: normal  Conduction Disutrbances:none  Narrative Interpretation: Right atrial abnormality, no T-wave inversions today  Old EKG Reviewed: changes noted from 08/22/2009    Angus Seller, PA 09/23/11 [phone]

## 2011-09-22 NOTE — ED Notes (Signed)
ZOX:[id number]> Expected date:09/22/11<BR> Expected time: 7:39 PM<BR> Means of arrival:Ambulance<BR> Comments:<BR> EMS 50 GC - HTN - ca per norm/no complaints from pt/hx of TIA and CVA/CBG 104/fam concerned about the HTN/ No neuro deficits noted

## 2011-09-23 ENCOUNTER — Inpatient Hospital Stay (HOSPITAL_COMMUNITY): Payer: Medicare Other

## 2011-09-23 ENCOUNTER — Encounter (HOSPITAL_COMMUNITY): Payer: Self-pay | Admitting: Internal Medicine

## 2011-09-23 ENCOUNTER — Other Ambulatory Visit: Payer: Self-pay

## 2011-09-23 DIAGNOSIS — F319 Bipolar disorder, unspecified: Secondary | ICD-10-CM | POA: Diagnosis present

## 2011-09-23 DIAGNOSIS — R531 Weakness: Secondary | ICD-10-CM | POA: Diagnosis present

## 2011-09-23 DIAGNOSIS — F1411 Cocaine abuse, in remission: Secondary | ICD-10-CM | POA: Diagnosis present

## 2011-09-23 DIAGNOSIS — R32 Unspecified urinary incontinence: Secondary | ICD-10-CM | POA: Insufficient documentation

## 2011-09-23 DIAGNOSIS — I10 Essential (primary) hypertension: Secondary | ICD-10-CM | POA: Insufficient documentation

## 2011-09-23 DIAGNOSIS — E875 Hyperkalemia: Secondary | ICD-10-CM | POA: Diagnosis present

## 2011-09-23 DIAGNOSIS — R112 Nausea with vomiting, unspecified: Secondary | ICD-10-CM | POA: Diagnosis present

## 2011-09-23 DIAGNOSIS — R197 Diarrhea, unspecified: Secondary | ICD-10-CM | POA: Diagnosis present

## 2011-09-23 DIAGNOSIS — N179 Acute kidney failure, unspecified: Secondary | ICD-10-CM | POA: Diagnosis present

## 2011-09-23 DIAGNOSIS — R2689 Other abnormalities of gait and mobility: Secondary | ICD-10-CM | POA: Diagnosis present

## 2011-09-23 DIAGNOSIS — R358 Other polyuria: Secondary | ICD-10-CM | POA: Diagnosis present

## 2011-09-23 DIAGNOSIS — I161 Hypertensive emergency: Secondary | ICD-10-CM | POA: Diagnosis present

## 2011-09-23 LAB — GLUCOSE, CAPILLARY

## 2011-09-23 LAB — CBC
HCT: 38.1 % — ABNORMAL LOW (ref 39.0–52.0)
Hemoglobin: 12.1 g/dL — ABNORMAL LOW (ref 13.0–17.0)
RBC: 4.36 MIL/uL (ref 4.22–5.81)
RDW: 14.2 % (ref 11.5–15.5)
WBC: 7.9 10*3/uL (ref 4.0–10.5)

## 2011-09-23 LAB — URINALYSIS, ROUTINE W REFLEX MICROSCOPIC
Glucose, UA: NEGATIVE mg/dL
Protein, ur: NEGATIVE mg/dL

## 2011-09-23 LAB — BASIC METABOLIC PANEL
CO2: 20 mEq/L (ref 19–32)
Calcium: 9.6 mg/dL (ref 8.4–10.5)
Chloride: 108 mEq/L (ref 96–112)
Creatinine, Ser: 3.25 mg/dL — ABNORMAL HIGH (ref 0.50–1.35)
GFR calc Af Amer: 10 mL/min — ABNORMAL LOW (ref >90)
Glucose, Bld: 95 mg/dL (ref 70–99)
Potassium: 4.7 mEq/L (ref 3.5–5.1)
Sodium: 143 mEq/L (ref 135–145)

## 2011-09-23 LAB — URINE MICROSCOPIC-ADD ON

## 2011-09-23 LAB — RAPID URINE DRUG SCREEN, HOSP PERFORMED: Barbiturates: NOT DETECTED

## 2011-09-23 LAB — MRSA PCR SCREENING: MRSA by PCR: POSITIVE — AB

## 2011-09-23 MED ORDER — ONDANSETRON HCL 4 MG/2ML IJ SOLN
4.0000 mg | Freq: Four times a day (QID) | INTRAMUSCULAR | Status: DC | PRN
Start: 2011-09-23 — End: 2011-09-28

## 2011-09-23 MED ORDER — MULTI-VITAMIN/MINERALS PO TABS: 1.0000 | ORAL_TABLET | Freq: Every day | ORAL | Status: DC

## 2011-09-23 MED ORDER — SODIUM CHLORIDE 0.9 % IV SOLN
INTRAVENOUS | Status: DC
  Administered 2011-09-23: 05:00:00 via INTRAVENOUS

## 2011-09-23 MED ORDER — HYDRALAZINE HCL 20 MG/ML IJ SOLN
10.0000 mg | INTRAMUSCULAR | Status: DC | PRN
  Administered 2011-09-23 (×2): 10 mg via INTRAVENOUS
  Filled 2011-09-23 (×2): qty 0.5

## 2011-09-23 MED ORDER — VERAPAMIL HCL 120 MG PO TABS: 120.0000 mg | ORAL_TABLET | Freq: Every day | ORAL | Status: DC

## 2011-09-23 MED ORDER — SODIUM CHLORIDE 0.9 % IV SOLN: INTRAVENOUS | Status: DC

## 2011-09-23 MED ORDER — ACETAMINOPHEN 650 MG RE SUPP: 650.0000 mg | Freq: Four times a day (QID) | RECTAL | Status: DC | PRN

## 2011-09-23 MED ORDER — METOPROLOL TARTRATE 50 MG PO TABS
50.0000 mg | ORAL_TABLET | Freq: Two times a day (BID) | ORAL | Status: DC
  Administered 2011-09-23 – 2011-09-28 (×11): 50 mg via ORAL
  Filled 2011-09-23 (×15): qty 1

## 2011-09-23 MED ORDER — LORAZEPAM 2 MG/ML IJ SOLN
0.5000 mg | Freq: Once | INTRAMUSCULAR | Status: AC
  Administered 2011-09-23: 0.5 mg via INTRAVENOUS
  Filled 2011-09-23: qty 1

## 2011-09-23 MED ORDER — HYDROMORPHONE HCL PF 1 MG/ML IJ SOLN: 0.5000 mg | INTRAMUSCULAR | Status: DC | PRN

## 2011-09-23 MED ORDER — VERAPAMIL HCL ER 120 MG PO TBCR
120.0000 mg | EXTENDED_RELEASE_TABLET | Freq: Every day | ORAL | Status: DC
  Administered 2011-09-23 – 2011-09-28 (×6): 120 mg via ORAL
  Filled 2011-09-23 (×7): qty 1

## 2011-09-23 MED ORDER — ADULT MULTIVITAMIN W/MINERALS CH
1.0000 | ORAL_TABLET | Freq: Every day | ORAL | Status: DC
  Administered 2011-09-23 – 2011-09-28 (×6): 1 via ORAL
  Filled 2011-09-23 (×7): qty 1

## 2011-09-23 MED ORDER — ONDANSETRON HCL 4 MG PO TABS: 4.0000 mg | ORAL_TABLET | Freq: Four times a day (QID) | ORAL | Status: DC | PRN

## 2011-09-23 MED ORDER — CLONIDINE HCL 0.2 MG/24HR TD PTWK
0.2000 mg | MEDICATED_PATCH | TRANSDERMAL | Status: DC
  Administered 2011-09-23: 0.2 mg via TRANSDERMAL
  Filled 2011-09-23: qty 1

## 2011-09-23 MED ORDER — SODIUM BICARBONATE 8.4 % IV SOLN
50.0000 meq | Freq: Once | INTRAVENOUS | Status: AC
  Administered 2011-09-23: 50 meq via INTRAVENOUS
  Filled 2011-09-23: qty 50

## 2011-09-23 MED ORDER — ACETAMINOPHEN 325 MG PO TABS: 650.0000 mg | ORAL_TABLET | Freq: Four times a day (QID) | ORAL | Status: DC | PRN

## 2011-09-23 NOTE — Progress Notes (Signed)
[facility] [place] TEAM 8  Subjective: Dominic Pineda is an 61 y.o. male who just recently was discharged from a drug rehab program for cocaine abuse 4 days ago and was brought to there ED secondary to progressive weakness, profuse nausea, vomiting and diarrhea X 2 days. Patient also has had profuse urination and thirst. Family reports that he was having increased weakness and difficulty getting out of bed. He was also described as having a shuffling gait which was different from before. The patient has a history of Bipolar disorder, but reports not being on lithium in many years.   At the time of my f/u exam the pt is alert and conversant, though somewhat sluggish.  He denies specific complaints at this time.  Objective: Weight change:   Intake/Output Summary (Last 24 hours) at 09/23/11 1330 Last data filed at 09/23/11 1200  Gross per 24 hour  Intake   1830 ml  Output   7925 ml  Net  -6095 ml   Blood pressure 170/85, pulse 85, temperature 97.7 F (36.5 C), temperature source Oral, resp. rate 19, height 5\' 10"  (1.778 m), weight 105.8 kg (233 lb 4 oz), SpO2 98.00%.  Physical Exam: F/u exam completed  Lab Results:  [facility] 09/23/11 0820 09/22/11 2030  NA 143 135  K 4.7 5.6*  CL 108 99  CO2 21 21  GLUCOSE 123* 106*  BUN 53* 76*  CREATININE 6.24* 11.71*  CALCIUM 9.4 8.7  MG -- --  PHOS -- --    Basename 09/23/11 0820 09/22/11 2030  WBC 7.9 11.4*  NEUTROABS -- 6.7  HGB 12.1* 12.2*  HCT 38.1* 36.3*  MCV 87.4 85.8  PLT 300 320   Micro Results: Recent Results (from the past 240 hour(s))  MRSA PCR SCREENING     Status: Abnormal   Collection Time   09/23/11  3:16 AM      Component Value Range Status Comment   MRSA by PCR POSITIVE (*) NEGATIVE  Final     Studies/Results: All recent x-ray/radiology reports have been reviewed in detail.   Medications: I have reviewed the patient's complete medication list.  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

## 2011-09-23 NOTE — ED Provider Notes (Signed)
Medical screening examination/treatment/procedure(s) were conducted as a shared visit with non-physician practitioner(s) and myself.  I personally evaluated the patient during the encounter This gentleman with a history of bipolar disorder and hypertension presents with concerns of diarrhea and hypertension.  On exam the patient is in no distress, though he is hypertensive, with mild tachycardia.  The patient is not a reliable historian.  The patient's labs are notable for new renal failure.  Given this finding, the patient's history of hypertension and the patient was admitted to the step down unit for further evaluation and management. I have seen ECG and agree with the interpretation.    Gerhard Munch, MD 09/23/11 2101

## 2011-09-23 NOTE — H&P (Addendum)
DATE OF ADMISSION:  09/23/2011  PCP:  [facility] in [place]     Chief Complaint:  Weakness, Nausea Vomiting and Diarrhea   HPI: Dominic Pineda is an 61 y.o. male who just recently was discharged from a drug rehab program for cocaine abuse 4 days ago and was brought to there ED secondary to progressive weakness, profuse nausea, vomiting and diarrhea X 2 days.  Patient also has had profuse urination and thirst.  Family reports that he was having increased weakness and difficulty getting out of bed.  He was also described as having a shuffling gait which was different from before.  The patient has a history of Bipolar disorder, but reports not being on lithium in many years.    Past Medical History  Diagnosis Date  . Hypertension   . Bipolar 1 disorder   . Incontinence     No past surgical history on file.  Medications:  HOME MEDS: Prior to Admission medications   Medication Sig Start Date End Date Taking? Authorizing Provider  acetaminophen (TYLENOL) 325 MG tablet Take 650 mg by mouth 2 (two) times daily as needed. For pain.   Yes Historical Provider, MD  Multiple Vitamins-Minerals (MULTIVITAMIN WITH MINERALS) tablet Take 1 tablet by mouth daily.   Yes Historical Provider, MD  Venlafaxine HCl (EFFEXOR PO) Take 1 tablet by mouth daily.   Yes Historical Provider, MD  verapamil (CALAN) 120 MG tablet Take 120 mg by mouth daily.   Yes Historical Provider, MD    Allergies:  Allergies  Allergen Reactions  . Codeine Nausea Only    Social History:   does not have a smoking history on file. He does not have any smokeless tobacco history on file. His alcohol and drug histories not on file.  Family History: Family History  Problem Relation Age of Onset  . Coronary artery disease Father     Review of Systems:  The patient denies anorexia, fever, weight loss, vision loss, decreased hearing, hoarseness, chest pain, syncope, dyspnea on exertion, peripheral edema, balance deficits,  hemoptysis, abdominal pain, melena, hematochezia, severe indigestion/heartburn, hematuria, incontinence, genital sores, muscle weakness, suspicious skin lesions, transient blindness, difficulty walking, depression, unusual weight change, abnormal bleeding, enlarged lymph nodes, angioedema, and breast masses.   Physical Exam:  GEN:  Agitated 62 year old well nourished well developed African American examined  and in no acute distress; cooperative with exam Filed Vitals:   09/23/11 0045 09/23/11 0100 09/23/11 0130 09/23/11 0200  BP: 191/101 166/135 188/95 171/116  Pulse: 102     Temp:      TempSrc:      Resp:      SpO2: 100%      Blood pressure 171/116, pulse 102, temperature 97.4 F (36.3 C), temperature source Oral, resp. rate 29, SpO2 100.00%. PSYCH: SHe is alert and oriented x4; does not appear anxious does not appear depressed; affect is normal HEENT: Normocephalic and Atraumatic, Mucous membranes pink; PERRLA; EOM intact; Fundi:  Benign;  No scleral icterus, Nares: Patent, Oropharynx: Clear, Neck:  FROM, no cervical lymphadenopathy nor thyromegaly or carotid bruit; no JVD; Breasts:: Not examined CHEST WALL: No tenderness CHEST: Normal respiration, clear to auscultation bilaterally HEART: Regular rate and rhythm; no murmurs rubs or gallops BACK: No kyphosis or scoliosis; no CVA tenderness ABDOMEN: Positive Bowel Sounds, Obese, soft non-tender; no masses, no organomegaly. Rectal Exam: Not done EXTREMITIES: No cyanosis, clubbing or edema; no ulcerations. Genitalia: not examined PULSES: 2+ and symmetric SKIN: Normal hydration no rash or  ulceration CNS: Cranial nerves 2-12 grossly intact no focal neurologic deficit   Labs & Imaging Results for orders placed during the hospital encounter of 09/22/11 (from the past 48 hour(s))  CBC     Status: Abnormal   Collection Time   09/22/11  8:30 PM      Component Value Range Comment   WBC 11.4 (*) 4.0 - 10.5 (K/uL)    RBC 4.23  4.22 - 5.81  (MIL/uL)    Hemoglobin 12.2 (*) 13.0 - 17.0 (g/dL)    HCT 14.7 (*) 82.9 - 52.0 (%)    MCV 85.8  78.0 - 100.0 (fL)    MCH 28.8  26.0 - 34.0 (pg)    MCHC 33.6  30.0 - 36.0 (g/dL)    RDW 56.2  13.0 - 86.5 (%)    Platelets 320  150 - 400 (K/uL)   DIFFERENTIAL     Status: Normal   Collection Time   09/22/11  8:30 PM      Component Value Range Comment   Neutrophils Relative 59  43 - 77 (%)    Neutro Abs 6.7  1.7 - 7.7 (K/uL)    Lymphocytes Relative 31  12 - 46 (%)    Lymphs Abs 3.5  0.7 - 4.0 (K/uL)    Monocytes Relative 7  3 - 12 (%)    Monocytes Absolute 0.8  0.1 - 1.0 (K/uL)    Eosinophils Relative 3  0 - 5 (%)    Eosinophils Absolute 0.3  0.0 - 0.7 (K/uL)    Basophils Relative 1  0 - 1 (%)    Basophils Absolute 0.1  0.0 - 0.1 (K/uL)   BASIC METABOLIC PANEL     Status: Abnormal   Collection Time   09/22/11  8:30 PM      Component Value Range Comment   Sodium 135  135 - 145 (mEq/L)    Potassium 5.6 (*) 3.5 - 5.1 (mEq/L)    Chloride 99  96 - 112 (mEq/L)    CO2 21  19 - 32 (mEq/L)    Glucose, Bld 106 (*) 70 - 99 (mg/dL)    BUN 76 (*) 6 - 23 (mg/dL)    Creatinine, Ser 78.46 (*) 0.50 - 1.35 (mg/dL)    Calcium 8.7  8.4 - 10.5 (mg/dL)    GFR calc non Af Amer 4 (*) >90 (mL/min)    GFR calc Af Amer 5 (*) >90 (mL/min)   GLUCOSE, CAPILLARY     Status: Abnormal   Collection Time   09/23/11 12:16 AM      Component Value Range Comment   Glucose-Capillary 131 (*) 70 - 99 (mg/dL)    Comment 1 Documented in Chart      Comment 2 Notify RN     URINALYSIS, ROUTINE W REFLEX MICROSCOPIC     Status: Abnormal   Collection Time   09/23/11 12:22 AM      Component Value Range Comment   Color, Urine YELLOW  YELLOW     APPearance CLEAR  CLEAR     Specific Gravity, Urine 1.010  1.005 - 1.030     pH 6.5  5.0 - 8.0     Glucose, UA NEGATIVE  NEGATIVE (mg/dL)    Hgb urine dipstick SMALL (*) NEGATIVE     Bilirubin Urine NEGATIVE  NEGATIVE     Ketones, ur NEGATIVE  NEGATIVE (mg/dL)    Protein, ur  NEGATIVE  NEGATIVE (mg/dL)    Urobilinogen, UA 0.2  0.0 -  1.0 (mg/dL)    Nitrite NEGATIVE  NEGATIVE     Leukocytes, UA TRACE (*) NEGATIVE    URINE MICROSCOPIC-ADD ON     Status: Normal   Collection Time   09/23/11 12:22 AM      Component Value Range Comment   WBC, UA 0-2  <3 (WBC/hpf)    RBC / HPF 0-2  <3 (RBC/hpf)   LITHIUM LEVEL     Status: Abnormal   Collection Time   09/23/11  1:19 AM      Component Value Range Comment   Lithium Lvl <0.25 (*) 0.80 - 1.40 (mEq/L) REPEATED TO VERIFY   No results found.    Assessment: Present on Admission:  .ARF (acute renal failure) .Diarrhea .Nausea & vomiting .Hyperkalemia .Polyuria .Hypertensive emergency .Bipolar 1 disorder .Shuffling gait .Weakness generalized .H/O cocaine abuse   Plan:    Transfer to [facility] Stepdown Bed due to ARF, and questionable Diabetes Insipidus Lithium level,TSH level and UDS sent IVFs  Isolation, Stool for C+S and C.diff. Antiemetics Catapres Patch and PRN IV Hydralazine Consult Renal May need MRI of brain SCDs for DVT prophylaxis Other plans as per orders.      CODE STATUS:      FULL CODE       Critical care time: 60 minutes.   Thelma Lorenzetti C 09/23/2011, 2:21 AM

## 2011-09-24 LAB — CBC
MCH: 28.9 pg (ref 26.0–34.0)
MCV: 87.1 fL (ref 78.0–100.0)
Platelets: 339 10*3/uL (ref 150–400)
RDW: 14.3 % (ref 11.5–15.5)

## 2011-09-24 LAB — RENAL FUNCTION PANEL
CO2: 23 mEq/L (ref 19–32)
Calcium: 9.8 mg/dL (ref 8.4–10.5)
Creatinine, Ser: 2.34 mg/dL — ABNORMAL HIGH (ref 0.50–1.35)
GFR calc Af Amer: 33 mL/min — ABNORMAL LOW (ref >90)
Glucose, Bld: 104 mg/dL — ABNORMAL HIGH (ref 70–99)

## 2011-09-24 LAB — URINE CULTURE: Colony Count: NO GROWTH

## 2011-09-24 MED ORDER — MUPIROCIN 2 % EX OINT
1.0000 "application " | TOPICAL_OINTMENT | Freq: Two times a day (BID) | CUTANEOUS | Status: DC
  Administered 2011-09-24 – 2011-09-28 (×7): 1 via NASAL
  Filled 2011-09-24: qty 22

## 2011-09-24 MED ORDER — CHLORHEXIDINE GLUCONATE CLOTH 2 % EX PADS
6.0000 | MEDICATED_PAD | Freq: Every day | CUTANEOUS | Status: AC
  Administered 2011-09-24 – 2011-09-28 (×5): 6 via TOPICAL

## 2011-09-24 NOTE — Progress Notes (Signed)
   CARE MANAGEMENT NOTE 09/24/2011  Patient:  Dominic Pineda, Dominic Pineda   Account Number:  [id number]  Date Initiated:  09/24/2011  Documentation initiated by:  Onnie Boer  Subjective/Objective Assessment:   PT WAS ADMITTED WITH ARF     Action/Plan:   PROGRESSION OF CARE AND DISCHARGE PLANNING   Anticipated DC Date:  09/27/2011   Anticipated DC Plan:  HOME/SELF CARE  In-house referral  Clinical Social Worker      DC Planning Services  CM consult      Choice offered to / List presented to:             Status of service:  In process, will continue to follow Medicare Important Message given?   (If response is "NO", the following Medicare IM given date fields will be blank) Date Medicare IM given:   Date Additional Medicare IM given:    Discharge Disposition:    Per UR Regulation:  Reviewed for med. necessity/level of care/duration of stay  Comments:  UR COMPLETED.  Onnie Boer, RN,BSN 09/14/11 1613 PT WAS ADMITTED WITH ARF FROM HOME WITH SELF CARE.  PT WAS RECENTLY DC'D FROM DRUG REHAB AND WAS POSITIVE THIS ADMISSION, WILL NOTIFY CSW.  WILL F/U ON DC NEEDS

## 2011-09-24 NOTE — Progress Notes (Addendum)
[facility] [place] TEAM 8  Interval history: Dominic Pineda is an 61 y.o. male who just recently was discharged from a drug rehab program for cocaine abuse 4 days ago and was brought to there ED secondary to progressive weakness, profuse nausea, vomiting and diarrhea X 2 days. Patient also has had profuse urination and thirst. Family reports that he was having increased weakness and difficulty getting out of bed. He was also described as having a shuffling gait which was different from before. The patient has a history of Bipolar disorder, but reports not being on lithium in many years.   Subjective: Today patient is much more alert and able to provide improved history. Currently denies chest pain or shortness of breath. States has a history of recurrent urinary incontinence but was only having difficulty with not being able to void for about 48 hours prior to admission.  Objective: Weight change: -2.8 kg (-6 lb 2.8 oz)  Intake/Output Summary (Last 24 hours) at 09/24/11 1324 Last data filed at 09/24/11 1100  Gross per 24 hour  Intake   1310 ml  Output   2805 ml  Net  -1495 ml   Blood pressure 136/64, pulse 69, temperature 98 F (36.7 C), temperature source Oral, resp. rate 18, height 5\' 10"  (1.778 m), weight 103 kg (227 lb 1.2 oz), SpO2 93.00%.  Physical Exam: General appearance: alert, cooperative, appears older than stated age and no distress Resp: clear to auscultation bilaterally, on room and maintaining saturations of 93% Cardio: regular rate and sinus rhythm, S1, S2 normal, no murmur, click, rub or gallop GI: soft, non-tender; bowel sounds normal; no masses,  no organomegaly GU: Foley catheter in place draining cloudy blood-tinged urine to bedside bag Extremities: extremities normal, atraumatic, no cyanosis or edema Neurologic: Grossly normal - has a very flat affect  Lab Results:  Basename 09/24/11 0530 09/23/11 1753 09/23/11 0820  NA 139 138 143  K 5.0 4.7 4.7    CL 105 104 108  CO2 23 20 21   GLUCOSE 104* 95 123*  BUN 31* 38* 53*  CREATININE 2.34* 3.25* 6.24*  CALCIUM 9.8 9.6 9.4  MG -- -- --  PHOS 4.3 -- --    Basename 09/24/11 0530 09/23/11 0820 09/22/11 2030  WBC 9.1 7.9 11.4*  NEUTROABS -- -- 6.7  HGB 13.2 12.1* 12.2*  HCT 39.7 38.1* 36.3*  MCV 87.1 87.4 85.8  PLT 339 300 320   Micro Results: Recent Results (from the past 240 hour(s))  STOOL CULTURE     Status: Normal (Preliminary result)   Collection Time   09/22/11  8:51 PM      Component Value Range Status Comment   Specimen Description STOOL   Final    Special Requests NONE   Final    Culture     Final    Value: NO SUSPICIOUS COLONIES, CONTINUING TO HOLD     Note: REDUCED NORMAL FLORA PRESENT   Report Status PENDING   Incomplete   URINE CULTURE     Status: Normal   Collection Time   09/23/11 12:22 AM      Component Value Range Status Comment   Specimen Description URINE, CATHETERIZED   Final    Special Requests NONE   Final    Culture  Setup Time [id number]   Final    Colony Count NO GROWTH   Final    Culture NO GROWTH   Final    Report Status 09/24/2011 FINAL   Final  MRSA PCR SCREENING     Status: Abnormal   Collection Time   09/23/11  3:16 AM      Component Value Range Status Comment   MRSA by PCR POSITIVE (*) NEGATIVE  Final     Studies/Results: All recent x-ray/radiology reports have been reviewed in detail.   Medications: I have reviewed the patient's current medications.  Assessment/Plan:  Severe acute renal failure/hematuria *Baseline crt from March 2012 1.05 which is consistent with a creatinine clearance of 72.5 *Presented with a creatinine of 12 which is now decreased to 2.34 *Only significant treatment has been placement of Foley catheter and decompressing the bladder with significant urinary output so concern for possible obstructive uropathy etiology *will obtain a renal US *PSA is pending *May need urology consultation especially if unable  to discontinue Foley catheter prior to discharge; otherwise can be accomplished as an outpatient-patient follows at the [facility]  Hyperkalemia *Over the past 24 hours has varied from 4.7 to 5.0 likely secondary to acute renal failure *Continue to follow electrolyte panel  N/V/D  *No more diarrhea since admit  Polyuria *CBGs have been consistently less than 180 since admission so increased urinary output likely related to high output acute renal failure *Has had 7590 cc of urine output since admission  HTN *Moderately controlled *Continue metoprolol and verapamil which are the patient's home medications  Bipolar D/O *Home clonazepam placed on hold due to altered mentation from azotemia and uremia *Consider resuming clonazepam as well as venlafaxine  History of Cocaine abuse *Urine drug screen was NEGATIVE for cocaine this admission  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 *Transfer to non-telemetry floor  Junious Silk, [place] [facility] Office  [phone] Pager [phone]  On-Call/Text Page:      [email or web address]      password [email or web address]  I have personally examined this patient and reviewed the entire database. I have reviewed the above note, made any necessary editorial changes, and agree with its content.  Lonia Blood, MD [facility]

## 2011-09-24 NOTE — Progress Notes (Signed)
Transferred patient to [id number] via wheelchair, alert and oriented.  Denies of pain or discomfort.

## 2011-09-25 ENCOUNTER — Inpatient Hospital Stay (HOSPITAL_COMMUNITY): Payer: Medicare Other

## 2011-09-25 LAB — RENAL FUNCTION PANEL
BUN: 30 mg/dL — ABNORMAL HIGH (ref 6–23)
CO2: 25 mEq/L (ref 19–32)
Glucose, Bld: 114 mg/dL — ABNORMAL HIGH (ref 70–99)
Phosphorus: 3.6 mg/dL (ref 2.3–4.6)
Potassium: 4.1 mEq/L (ref 3.5–5.1)

## 2011-09-25 LAB — CBC
HCT: 37.7 % — ABNORMAL LOW (ref 39.0–52.0)
Hemoglobin: 12.5 g/dL — ABNORMAL LOW (ref 13.0–17.0)
MCH: 28.7 pg (ref 26.0–34.0)
MCHC: 33.2 g/dL (ref 30.0–36.0)

## 2011-09-25 MED ORDER — VENLAFAXINE HCL 37.5 MG PO TABS
37.5000 mg | ORAL_TABLET | Freq: Two times a day (BID) | ORAL | Status: DC
  Administered 2011-09-26 – 2011-09-28 (×5): 37.5 mg via ORAL
  Filled 2011-09-25 (×11): qty 1

## 2011-09-25 MED ORDER — CLONAZEPAM 0.5 MG PO TABS
0.2500 mg | ORAL_TABLET | Freq: Every day | ORAL | Status: DC
  Administered 2011-09-25 – 2011-09-27 (×3): 0.25 mg via ORAL
  Filled 2011-09-25 (×3): qty 1

## 2011-09-25 NOTE — Progress Notes (Signed)
Rn contacted Dr Elisabeth Pigeon to ask about plan for pt. Daughter of pt is Gordy Councilman [phone]. Md will speak with her tomorrow.

## 2011-09-25 NOTE — Progress Notes (Signed)
Patient ID: MANDELA BELLO, male   DOB: 1950/10/09, 61 y.o.   MRN: [id number]  Assessment and Plan:  Principal Problem:  *Severe acute renal failure/hematuria  *Baseline crt from March 2012 1.05  *Presented with a creatinine of 12 which is now decreased to 1.7 *Only significant treatment has been placement of Foley catheter and decompressing the bladder with significant urinary output so concern for possible obstructive uropathy  * follow up renal ultrasound - pending *PSA is pending  *May need urology consultation especially if unable to discontinue Foley catheter prior to discharge; otherwise can be accomplished as an outpatient-patient follows at the [facility]   Active Problems:  Hyperkalemia  * likely secondary to acute renal failure; resolved  HTN  *Moderately controlled  *Continue metoprolol and verapamil which are the patient's home medications   Bipolar D/O  *Home clonazepam placed on hold due to altered mentation from azotemia and uremia  *we will resume clonazepam as well as venlafaxine   History of Cocaine abuse  *Urine drug screen was NEGATIVE for cocaine this admission   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 Patient denies chest pain, shortness of breath, abdominal pain.   Objective:  Vital signs in last 24 hours:  Filed Vitals:   09/25/11 1020 09/25/11 1400 09/25/11 1800 09/25/11 1856  BP: 153/78 140/75 169/73 147/78  Pulse: 86 89 85 89  Temp: 97.9 F (36.6 C)  97.9 F (36.6 C)   TempSrc: Oral  Oral   Resp: 16 20 20    Height:      Weight:      SpO2: 94% 94% 99% 95%    Intake/Output from previous day:    Gross per 24 hour  Intake    760 ml  Output   1500 ml  Net   -740 ml    Physical Exam: General: Alert, awake, oriented x3, in no acute distress. HEENT: No bruits, no goiter. Moist mucous membranes, no scleral icterus, no conjunctival pallor. Heart: Regular rate and rhythm, S1/S2 +, no  murmurs, rubs, gallops. Lungs: Clear to auscultation bilaterally. No wheezing, no rhonchi, no rales.  Abdomen: Soft, nontender, nondistended, positive bowel sounds. Extremities: No clubbing or cyanosis, no pitting edema,  positive pedal pulses. Neuro: Grossly nonfocal.  Lab Results:   Lab 09/25/11 0620 09/24/11 0530 09/23/11 0820 09/22/11 2030  WBC 11.2* 9.1 7.9 11.4*  HGB 12.5* 13.2 12.1* 12.2*  HCT 37.7* 39.7 38.1* 36.3*  PLT 356 339 300 320  MCV 86.5 87.1 87.4 85.8    Lab 09/25/11 0620 09/24/11 0530 09/23/11 1753 09/23/11 0820 09/22/11 2030  NA 137 139 138 143 135  K 4.1 5.0 4.7 4.7 5.6*  CL 103 105 104 108 99  CO2 25 23 20 21 21   GLUCOSE 114* 104* 95 123* 106*  BUN 30* 31* 38* 53* 76*  CREATININE 1.74* 2.34* 3.25* 6.24* 11.71*  CALCIUM 9.5 9.8 9.6 9.4 8.7    Lab 09/23/11 1753  INR 1.10  PROTIME --    Recent Results (from the past 240 hour(s))  STOOL CULTURE     Status: Normal (Preliminary result)   Collection Time   09/22/11  8:51 PM      Component Value Range Status Comment   Specimen Description STOOL   Final    Special Requests NONE   Final    Culture     Final    Value: NO SUSPICIOUS COLONIES, CONTINUING TO HOLD     Note: REDUCED  NORMAL FLORA PRESENT   Report Status PENDING   Incomplete   URINE CULTURE     Status: Normal   Collection Time   09/23/11 12:22 AM      Component Value Range Status Comment   Specimen Description URINE, CATHETERIZED   Final    Special Requests NONE   Final    Culture  Setup Time [id number]   Final    Colony Count NO GROWTH   Final    Culture NO GROWTH   Final    Report Status 09/24/2011 FINAL   Final   MRSA PCR SCREENING     Status: Abnormal   Collection Time   09/23/11  3:16 AM      Component Value Range Status Comment   MRSA by PCR POSITIVE (*) NEGATIVE  Final     Studies/Results: US Renal 09/25/2011 - pending    Medications: Scheduled Meds:   . metoprolol tartrate  50 mg Oral BID  . mulitivitamin with minerals  1  tablet Oral Daily  . verapamil  120 mg Oral Daily     LOS: 3 days   Emmajo Bennette 09/25/2011, 9:12 PM  [facility] Pager: [phone]

## 2011-09-25 NOTE — Clinical Documentation Improvement (Signed)
Hypertension Documentation Clarification Query  THIS DOCUMENT IS NOT A PERMANENT PART OF THE MEDICAL RECORD  TO RESPOND TO THE THIS QUERY, FOLLOW THE INSTRUCTIONS BELOW:  1. If needed, update documentation for the patient's encounter via the notes activity.  2. Access this query again and click edit on the In [employer].  3. After updating, or not, click F2 to complete all highlighted (required) fields concerning your review. Select "additional documentation in the medical record" OR "no additional documentation provided".  4. Click Sign note button.  5. The deficiency will fall out of your In Basket *Please let us know if you are not able to complete this workflow by phone or e-mail (listed below).         09/25/11  Dear Dr. [facility]  In an effort to better capture your patient's severity of illness, reflect appropriate length of stay and utilization of resources, a review of the patient medical record has revealed the following indicators.    Based on your clinical judgment, please clarify and document in a progress note and/or discharge summary the clinical condition associated with the following supporting information:  In responding to this query please exercise your independent judgment.  The fact that a query is asked, does not imply that any particular answer is desired or expected.  May also state that the condition is resolving, resolved,etc    Possible Clinical Conditions   __present on admission______Malignant Hypertension - responded well to treatment  ________Or Other Condition   ________Cannot Clinically Determine   Supporting Information: BP 191/101 on admission likely secondary to non compliance  Risk Factors: Hx HTN Acute Renal Failure Cocaine abuse  Without B/P medicine for several days  Signs and Symptoms: SBP range:188-240 day of adm DBP range:92-135   "" "Generalized Weakness" per notes  Treatment: Lopressor 50mg  po twice a  day Verapamil 120mg  po daily Hydralazine 10mg  IVP q4prn given x 2 Clonidine 0.2mg  patch applied but later d/c'd   You may use possible, probable, or suspect with inpatient documentation. Possible, probable, suspected diagnoses MUST be documented at the time of discharge.  Reviewed: additional information provided above  Thank You,    Rossie Muskrat RN, BSN Clinical Documentation Specialist Pager:  [phone] [email or web address]  Health Information Management [place]

## 2011-09-26 LAB — CBC
HCT: 37.2 % — ABNORMAL LOW (ref 39.0–52.0)
Hemoglobin: 12.6 g/dL — ABNORMAL LOW (ref 13.0–17.0)
MCV: 86.5 fL (ref 78.0–100.0)
RDW: 13.6 % (ref 11.5–15.5)
WBC: 10.4 10*3/uL (ref 4.0–10.5)

## 2011-09-26 LAB — BASIC METABOLIC PANEL
BUN: 30 mg/dL — ABNORMAL HIGH (ref 6–23)
CO2: 26 mEq/L (ref 19–32)
Chloride: 104 mEq/L (ref 96–112)
Creatinine, Ser: 1.56 mg/dL — ABNORMAL HIGH (ref 0.50–1.35)
GFR calc Af Amer: 53 mL/min — ABNORMAL LOW (ref >90)
Glucose, Bld: 112 mg/dL — ABNORMAL HIGH (ref 70–99)

## 2011-09-26 LAB — STOOL CULTURE

## 2011-09-26 MED ORDER — TAMSULOSIN HCL 0.4 MG PO CAPS
0.4000 mg | ORAL_CAPSULE | Freq: Every day | ORAL | Status: DC
  Administered 2011-09-26 – 2011-09-27 (×2): 0.4 mg via ORAL
  Filled 2011-09-26 (×4): qty 1

## 2011-09-26 NOTE — Progress Notes (Signed)
Subjective: Patient seen and examined this morning. Denies any symptoms  Objective:  Vital signs in last 24 hours:  Filed Vitals:   09/25/11 1856 09/25/11 2200 09/26/11 0200 09/26/11 0600  BP: 147/78 148/78 130/83 144/80  Pulse: 89 74 73 88  Temp:  98.1 F (36.7 C) 98.1 F (36.7 C) 97.6 F (36.4 C)  TempSrc:  Oral Oral   Resp:  20 20 16   Height:      Weight:      SpO2: 95% 93%  93%    Intake/Output from previous day:   Intake/Output Summary (Last 24 hours) at 09/26/11 1442 Last data filed at 09/26/11 0700  Gross per 24 hour  Intake    400 ml  Output   1550 ml  Net  -1150 ml    Physical Exam:  General: elderly poorly groomed male  in no acute distress. HEENT: no pallor, no icterus, moist oral mucosa, no JVD, no lymphadenopathy Heart: Normal  s1 &s2  Regular rate and rhythm, without murmurs, rubs, gallops. Lungs: Clear to auscultation bilaterally. Abdomen: Soft, nontender, nondistended, positive bowel sounds.foley in place draining clear urine Extremities: No clubbing cyanosis or edema with positive pedal pulses. Neuro: Alert, awake, oriented x3, nonfocal.   Lab Results:  Basic Metabolic Panel:    Component Value Date/Time   NA 138 09/26/2011 0705   K 3.6 09/26/2011 0705   CL 104 09/26/2011 0705   CO2 26 09/26/2011 0705   BUN 30* 09/26/2011 0705   CREATININE 1.56* 09/26/2011 0705   GLUCOSE 112* 09/26/2011 0705   CALCIUM 9.3 09/26/2011 0705   CBC:    Component Value Date/Time   WBC 10.4 09/26/2011 0705   HGB 12.6* 09/26/2011 0705   HCT 37.2* 09/26/2011 0705   PLT 312 09/26/2011 0705   MCV 86.5 09/26/2011 0705   NEUTROABS 6.7 09/22/2011 2030   LYMPHSABS 3.5 09/22/2011 2030   MONOABS 0.8 09/22/2011 2030   EOSABS 0.3 09/22/2011 2030   BASOSABS 0.1 09/22/2011 2030    Recent Results (from the past 240 hour(s))  STOOL CULTURE     Status: Normal   Collection Time   09/22/11  8:51 PM      Component Value Range Status Comment   Specimen Description STOOL   Final    Special Requests NONE   Final    Culture     Final    Value: NO SALMONELLA, SHIGELLA, CAMPYLOBACTER, OR YERSINIA ISOLATED     Note: REDUCED NORMAL FLORA PRESENT   Report Status 09/26/2011 FINAL   Final   URINE CULTURE     Status: Normal   Collection Time   09/23/11 12:22 AM      Component Value Range Status Comment   Specimen Description URINE, CATHETERIZED   Final    Special Requests NONE   Final    Culture  Setup Time [id number]   Final    Colony Count NO GROWTH   Final    Culture NO GROWTH   Final    Report Status 09/24/2011 FINAL   Final   MRSA PCR SCREENING     Status: Abnormal   Collection Time   09/23/11  3:16 AM      Component Value Range Status Comment   MRSA by PCR POSITIVE (*) NEGATIVE  Final     Studies/Results: US Renal  09/25/2011  *RADIOLOGY REPORT*  Clinical Data: Acute renal failure  RENAL/URINARY TRACT ULTRASOUND COMPLETE  Comparison:  CT scan 05/02/2007  Findings:  Right Kidney:  Measures 11.6  cm in length.  No hydronephrosis or diagnostic renal calculi  Left Kidney:  measures 11.6 cm in length.  No hydronephrosis or diagnostic renal calculus.  Bladder:  There is thickening of urinary bladder wall.  The urinary bladder is decompressed with a Foley catheter.  Echogenic layering material within anterior aspect of the bladder suspicious for bladder calculi.  Clinical correlation is necessary.  IMPRESSION:  1.  No hydronephrosis or diagnostic renal calculus. 2. There is thickening of urinary bladder wall.  The urinary bladder is decompressed with a Foley catheter.  Echogenic layering material within anterior aspect of the bladder suspicious for bladder calculi.  Clinical correlation is necessary.  Original Report Authenticated By: Natasha Mead, M.D.    Medications: Scheduled Meds:   . Chlorhexidine Gluconate Cloth  6 each Topical Q0600  . clonazePAM  0.25 mg Oral QHS  . metoprolol tartrate  50 mg Oral BID  . mulitivitamin with minerals  1 tablet Oral Daily  . mupirocin  ointment  1 application Nasal BID  . venlafaxine  37.5 mg Oral BID WC  . verapamil  120 mg Oral Daily   Continuous Infusions:  PRN Meds:.acetaminophen, acetaminophen, hydrALAZINE, HYDROmorphone, ondansetron (ZOFRAN) IV, ondansetron  Assessment 61 y.o. male recently  discharged from a drug rehab program for cocaine abuse 4 days ago and was brought to there ED secondary to progressive weakness, profuse nausea, vomiting and diarrhea X 2 days. Patient also has had profuse urination and thirst. Family reports that he was having increased weakness and difficulty getting out of bed. Patient noted for AKI likely due to obstructive uropathy and improved after foley placed in.    Plan: Severe acute renal failure/hematuria  -normal baseline creatinine  -Presented with a creatinine of 12 which significantly improved after foley placed in has now improved to 1.34 today  Renal US negative for hydronephrosis. PSA wnl. Patient gives hx of symptoms BPH including urgency, hesitancy and poor stream for past 5-6 months   discussed with urology Dr Sherron Monday  And recommended discharging patient on foley for now and follow up with urology in 1 week Will start on flomax  Hyperkalemia  likely associated with his AKI and now resolved   Polyuria   likely related to high output acute renal failure  fsg wnl Has had 9330 cc of urine output since admission    N/V  resolved, possibly related to uremia  HTN  controlled  *Continue metoprolol and verapamil which are the patient's home medications   Bipolar D/O  *Home clonazepam placed on hold due to altered mentation from azotemia and uremia  *resume clonazepam as well as venlafaxine on d/c  History of Cocaine abuse  *Urine drug screen negative      LOS: 4 days   Dominic Pineda 09/26/2011, 2:42 PM

## 2011-09-26 NOTE — Evaluation (Signed)
Physical Therapy Evaluation Patient Details Name: Dominic Pineda MRN: [id number] DOB: July 11, 1951 Today's Date: 09/26/2011  Problem List:  Patient Active Problem List  Diagnoses  . ARF (acute renal failure)  . Diarrhea  . Nausea & vomiting  . Hyperkalemia  . Polyuria  . Hypertensive emergency  . Bipolar 1 disorder  . Shuffling gait  . Weakness generalized  . H/O cocaine abuse  . Hypertension  . Incontinence    Past Medical History:  Past Medical History  Diagnosis Date  . Hypertension   . Bipolar 1 disorder   . Incontinence   . Stroke   . Mental disorder   . Depression   . Anxiety   . Blood transfusion 1970's  . Shortness of breath     has had a recent increase in episodes of shortness of breath and hyperventilating   Past Surgical History:  Past Surgical History  Procedure Date  . Hernia repair     PT Assessment/Plan/Recommendation PT Assessment Clinical Impression Statement: 61 yo male admitted for severe acute renal failure/hematuria  in combination with multiple comorbidities. Pt reports he just returned from drug rehab at [facility] and is now weak. Pt presents with decreased abiltiy to mobilize independently and safely. Will benefit from acute PT to address deficits below. Also uncertain if level of assist will be able to be provided at home. Pt reporting he wants to go home with HHPT however he may benefit most form ST-SNF PT Recommendation/Assessment: Patient will need skilled PT in the acute care venue PT Problem List: Decreased strength;Decreased activity tolerance;Decreased balance;Decreased mobility;Decreased knowledge of use of DME Barriers to Discharge: Decreased caregiver support PT Therapy Diagnosis : Difficulty walking;Abnormality of gait;Generalized weakness PT Plan PT Frequency: Min 3X/week PT Treatment/Interventions: DME instruction;Gait training;Stair training;Functional mobility training;Therapeutic activities;Therapeutic exercise;Balance  training;Patient/family education PT Recommendation Follow Up Recommendations: Home health PT;Skilled nursing facility;Supervision for mobility/OOB Equipment Recommended: Rolling walker with 5" wheels;3 in 1 bedside comode (if pt returns home) PT Goals  Acute Rehab PT Goals PT Goal Formulation: With patient Time For Goal Achievement: 2 weeks Pt will go Sit to Supine/Side: with modified independence PT Goal: Sit to Supine/Side - Progress: Goal set today Pt will go Sit to Stand: with modified independence PT Goal: Sit to Stand - Progress: Goal set today Pt will go Stand to Sit: with modified independence PT Goal: Stand to Sit - Progress: Goal set today Pt will Ambulate: 51 - 150 feet;with least restrictive assistive device;with supervision PT Goal: Ambulate - Progress: Goal set today Pt will Go Up / Down Stairs: 3-5 stairs;with supervision;with least restrictive assistive device PT Goal: Up/Down Stairs - Progress: Goal set today Pt will Perform Home Exercise Program: Independently (LE exercise program) PT Goal: Perform Home Exercise Program - Progress: Goal set today  PT Evaluation Precautions/Restrictions   Fall Prior Functioning  Home Living Lives With: Family (mother, niece, nephew, sister) Type of Home: House Home Layout: One level Home Access: Stairs to enter Entrance Stairs-Rails: Right Entrance Stairs-Number of Steps: 3 Bathroom Shower/Tub: Engineer, manufacturing systems: Standard Bathroom Accessibility: Yes How Accessible: Accessible via walker Home Adaptive Equipment: None Prior Function Level of Independence: Independent with basic ADLs;Independent with gait;Independent with transfers Driving: No (hasn't driven in a month) Vocation: Retired (and on disability) Comments: Recently from [place] for drug rehab Cognition Cognition Arousal/Alertness: Awake/alert Overall Cognitive Status: Appears within functional limits for tasks assessed Orientation Level:  Oriented X4 Sensation/Coordination Sensation Light Touch: Appears Intact (Per pt report, bil. LEs) Coordination  Gross Motor Movements are Fluid and Coordinated: Yes Extremity Assessment RLE AROM (degrees) Overall AROM Right Lower Extremity: Within functional limits for tasks assessed RLE Strength Right Hip Flexion: 3/5 Right Knee Flexion: 3+/5 Right Knee Extension: 3+/5 Right Ankle Dorsiflexion: 3+/5 LLE Assessment LLE Assessment: Exceptions to WFL LLE AROM (degrees) Overall AROM Left Lower Extremity: Within functional limits for tasks assessed LLE Strength Left Hip Flexion: 3/5 Left Knee Flexion: 3+/5 Left Knee Extension: 3+/5 Left Ankle Dorsiflexion: 3+/5 Mobility (including Balance) Bed Mobility Bed Mobility: Yes Rolling Right: 6: Modified independent (Device/Increase time) Right Sidelying to Sit: 6: Modified independent (Device/Increase time) Transfers Transfers: Yes Sit to Stand: 3: Mod assist Sit to Stand Details (indicate cue type and reason): moderate assist to intiate movement, min assist to complete movement. Verbal cues for UE placement for safety.  Stand to Sit: 4: Min assist;To chair/3-in-1 Stand to Sit Details: Assist secondary to pt's LE weakness, tendency to "plop." Verbal cues for UE placement and control of descent.  Ambulation/Gait Ambulation/Gait: Yes Ambulation/Gait Assistance: 4: Min assist Ambulation/Gait Assistance Details (indicate cue type and reason): Assist secondary weakness and sway. Pt relient on RW at this time. Pt easily fatigued with short distance ambulation. VCs for safe positioning of RW with functional activity.  Ambulation Distance (Feet): 30 Feet Assistive device: Rolling walker Gait Pattern: Step-through pattern;Trunk flexed;Decreased stride length (Decreased foot clearance bilaterally.) Stairs: No  Posture/Postural Control Posture/Postural Control: No significant limitations Balance Balance Assessed: Yes Static Sitting  Balance Static Sitting - Balance Support: No upper extremity supported Static Sitting - Level of Assistance: 5: Stand by assistance Static Standing Balance Static Standing - Balance Support: No upper extremity supported;During functional activity Static Standing - Level of Assistance: 4: Min assist Static Standing - Comment/# of Minutes: Pt with decreased static stability - requires extra point of contact for stability (RW or sink) for improved stability.  Exercise  General Exercises - Lower Extremity Ankle Circles/Pumps: AROM;Both;15 reps;Seated Quad Sets: AROM;Both;10 reps;Seated (5 sec holds) End of Session PT - End of Session Equipment Utilized During Treatment: Gait belt Activity Tolerance: Patient limited by fatigue Patient left: in chair;with call bell in reach Nurse Communication: Mobility status for transfers;Mobility status for ambulation General Behavior During Session: [facility] for tasks performed Cognition: [facility] for tasks performed Cognitive Impairment: Slightly slow to process however appropriate and WFL.   Sherie Don 09/26/2011, 1:54 PM  Sherie Don) Carleene Mains PT, DPT Acute Rehabilitation [phone]

## 2011-09-27 ENCOUNTER — Telehealth: Payer: Self-pay | Admitting: Internal Medicine

## 2011-09-27 LAB — CBC
HCT: 35.8 % — ABNORMAL LOW (ref 39.0–52.0)
Hemoglobin: 11.8 g/dL — ABNORMAL LOW (ref 13.0–17.0)
MCH: 28.6 pg (ref 26.0–34.0)
MCHC: 33 g/dL (ref 30.0–36.0)
MCV: 86.7 fL (ref 78.0–100.0)
RDW: 13.5 % (ref 11.5–15.5)

## 2011-09-27 LAB — BASIC METABOLIC PANEL
BUN: 27 mg/dL — ABNORMAL HIGH (ref 6–23)
Creatinine, Ser: 1.47 mg/dL — ABNORMAL HIGH (ref 0.50–1.35)
GFR calc Af Amer: 57 mL/min — ABNORMAL LOW (ref >90)
GFR calc non Af Amer: 49 mL/min — ABNORMAL LOW (ref >90)
Glucose, Bld: 142 mg/dL — ABNORMAL HIGH (ref 70–99)

## 2011-09-27 NOTE — Progress Notes (Signed)
Subjective: Patient seen and examined this am. Denies any symptoms. offered to go to rehab and he agrees  Objective:  Vital signs in last 24 hours:  Filed Vitals:   09/27/11 0200 09/27/11 0600 09/27/11 0934 09/27/11 1353  BP: 132/75 136/72 143/82 120/56  Pulse: 69 64 76 70  Temp: 98.1 F (36.7 C) 97.9 F (36.6 C)  98.1 F (36.7 C)  TempSrc: Oral Oral  Oral  Resp: 19 20  18   Height:      Weight:      SpO2: 95% 96%  93%    Intake/Output from previous day:   Intake/Output Summary (Last 24 hours) at 09/27/11 1620 Last data filed at 09/27/11 1356  Gross per 24 hour  Intake    480 ml  Output   1850 ml  Net  -1370 ml    Physical Exam: General: elderly poorly groomed male in no acute distress.  HEENT: no pallor, no icterus, moist oral mucosa, no JVD, no lymphadenopathy  Heart: Normal s1 &s2 Regular rate and rhythm, without murmurs, rubs, gallops.  Lungs: Clear to auscultation bilaterally.  Abdomen: Soft, nontender, nondistended, positive bowel sounds.foley in place draining clear urine  Extremities: No clubbing cyanosis or edema with positive pedal pulses.  Neuro: Alert, awake, oriented x3, nonfocal.    Lab Results:  Basic Metabolic Panel:    Component Value Date/Time   NA 137 09/27/2011 0650   K 3.9 09/27/2011 0650   CL 102 09/27/2011 0650   CO2 25 09/27/2011 0650   BUN 27* 09/27/2011 0650   CREATININE 1.47* 09/27/2011 0650   GLUCOSE 142* 09/27/2011 0650   CALCIUM 9.3 09/27/2011 0650   CBC:    Component Value Date/Time   WBC 10.1 09/27/2011 0650   HGB 11.8* 09/27/2011 0650   HCT 35.8* 09/27/2011 0650   PLT 300 09/27/2011 0650   MCV 86.7 09/27/2011 0650   NEUTROABS 6.7 09/22/2011 2030   LYMPHSABS 3.5 09/22/2011 2030   MONOABS 0.8 09/22/2011 2030   EOSABS 0.3 09/22/2011 2030   BASOSABS 0.1 09/22/2011 2030    Recent Results (from the past 240 hour(s))  STOOL CULTURE     Status: Normal   Collection Time   09/22/11  8:51 PM      Component Value Range Status Comment   Specimen Description STOOL   Final    Special Requests NONE   Final    Culture     Final    Value: NO SALMONELLA, SHIGELLA, CAMPYLOBACTER, OR YERSINIA ISOLATED     Note: REDUCED NORMAL FLORA PRESENT   Report Status 09/26/2011 FINAL   Final   URINE CULTURE     Status: Normal   Collection Time   09/23/11 12:22 AM      Component Value Range Status Comment   Specimen Description URINE, CATHETERIZED   Final    Special Requests NONE   Final    Culture  Setup Time [id number]   Final    Colony Count NO GROWTH   Final    Culture NO GROWTH   Final    Report Status 09/24/2011 FINAL   Final   MRSA PCR SCREENING     Status: Abnormal   Collection Time   09/23/11  3:16 AM      Component Value Range Status Comment   MRSA by PCR POSITIVE (*) NEGATIVE  Final     Studies/Results: No results found.  Medications: Scheduled Meds:   . Chlorhexidine Gluconate Cloth  6 each Topical Q0600  . clonazePAM  0.25 mg Oral QHS  . metoprolol tartrate  50 mg Oral BID  . mulitivitamin with minerals  1 tablet Oral Daily  . mupirocin ointment  1 application Nasal BID  . Tamsulosin HCl  0.4 mg Oral QPC supper  . venlafaxine  37.5 mg Oral BID WC  . verapamil  120 mg Oral Daily   Continuous Infusions:  PRN Meds:.acetaminophen, acetaminophen, hydrALAZINE, HYDROmorphone, ondansetron (ZOFRAN) IV, ondansetron  Assessment  61 y.o. male recently discharged from a drug rehab program for cocaine abuse 4 days ago and was brought to there ED secondary to progressive weakness, profuse nausea, vomiting and diarrhea X 2 days. Patient also has had profuse urination and thirst. Family reports that he was having increased weakness and difficulty getting out of bed. Patient noted for AKI likely due to obstructive uropathy and improved after foley placed in.   Plan:  Severe acute renal failure/hematuria  -normal baseline creatinine  -Presented with a creatinine of 12 which significantly improved after foley placed in  has now  improved to 1.47 today Renal US negative for hydronephrosis. PSA wnl. Patient gives hx of symptoms BPH including urgency, hesitancy and poor stream for past 5-6 months  discussed with urology Dr Sherron Monday And recommended discharging patient on foley for now and follow up with urology in 1 week  Will start on flomax   Hyperkalemia  likely associated with his AKI and now resolved   Polyuria  likely related to high output acute renal failure  fsg wnl  Has had over 10 L of urine output since admission   N/V  resolved, possibly related to uremia   HTN  controlled  *Continue metoprolol and verapamil which are the patient's home medications   Bipolar D/O  *Home clonazepam placed on hold due to altered mentation from azotemia and uremia  *resumeD clonazepam as well as venlafaxine   History of Cocaine abuse  *Urine drug screen negative   DISPO:  discussed with daughter who feels patient's mother will not be able to take care of him at this time at home and he has issues with medication non compliance. Agree that he would benefit from going to SNF    LOS: 5 days   Dominic Pineda 09/27/2011, 4:20 PM

## 2011-09-27 NOTE — Progress Notes (Signed)
Utilization review completed.  

## 2011-09-27 NOTE — Progress Notes (Signed)
Clinical Social Worker completed the psychosocial assessment which can be found in the shadow chart.  Patient was residing at home with his sister, niece, and mother prior to hospital admission.  Per PT, patient could benefit from short term skilled nursing facility.  Patient discharge plan was to return home with home health and family assistance and has now changed his mind and is agreeable to SNF search in [place].  CSW to complete FL2 and initiate SNF search in [facility].  Clinical Social Worker will continue to follow up to provide bed offers and facilitate patient discharge plans.  [address] [place], [place] [phone]

## 2011-09-28 DIAGNOSIS — N139 Obstructive and reflux uropathy, unspecified: Secondary | ICD-10-CM | POA: Diagnosis present

## 2011-09-28 LAB — BASIC METABOLIC PANEL
Chloride: 101 mEq/L (ref 96–112)
GFR calc Af Amer: 62 mL/min — ABNORMAL LOW (ref >90)
GFR calc non Af Amer: 54 mL/min — ABNORMAL LOW (ref >90)
Potassium: 3.9 mEq/L (ref 3.5–5.1)
Sodium: 136 mEq/L (ref 135–145)

## 2011-09-28 LAB — CBC
Hemoglobin: 11.9 g/dL — ABNORMAL LOW (ref 13.0–17.0)
MCHC: 33.2 g/dL (ref 30.0–36.0)
RDW: 13.4 % (ref 11.5–15.5)
WBC: 9.5 10*3/uL (ref 4.0–10.5)

## 2011-09-28 MED ORDER — TAMSULOSIN HCL 0.4 MG PO CAPS: 0.4000 mg | ORAL_CAPSULE | Freq: Every day | ORAL | Status: DC

## 2011-09-28 NOTE — Progress Notes (Signed)
Physical Therapy Treatment Patient Details Name: Dominic Pineda MRN: [id number] DOB: 09/18/1950 Today's Date: 09/28/2011  PT Assessment/Plan  PT - Assessment/Plan Comments on Treatment Session: Pt making good progress in therapy, but continues to require safety cues with mobility and decreased activity tolerance.  PT Plan: Discharge plan remains appropriate PT Frequency: Min 3X/week Follow Up Recommendations: Skilled nursing facility;Supervision/Assistance - 24 hour Equipment Recommended: Defer to next venue PT Goals  Acute Rehab PT Goals PT Goal: Sit to Stand - Progress: Progressing toward goal PT Goal: Stand to Sit - Progress: Progressing toward goal PT Goal: Ambulate - Progress: Progressing toward goal PT Goal: Perform Home Exercise Program - Progress: Progressing toward goal  PT Treatment Precautions/Restrictions  Precautions Precautions: Fall Restrictions Weight Bearing Restrictions: No Mobility (including Balance) Bed Mobility Bed Mobility: No Transfers Sit to Stand: 4: Min assist;From bed;With upper extremity assist Sit to Stand Details (indicate cue type and reason): Min A for steadying only. Pt attempted to pull up on therapist, and was redirected to push up from bed.  Stand to Sit: 4: Min assist;With upper extremity assist;With armrests;To chair/3-in-1 Stand to Sit Details:  Min A for steadying only, cues for hand placement and to use RW to complete turn Ambulation/Gait Ambulation/Gait Assistance: 4: Min assist Ambulation/Gait Assistance Details (indicate cue type and reason): Cues to keep RW on floor and decrease pace for safety Ambulation Distance (Feet): 50 Feet Assistive device: Rolling walker Gait Pattern: Step-through pattern;Decreased stride length;Shuffle;Trunk flexed Stairs: No    Exercise  General Exercises - Lower Extremity Ankle Circles/Pumps: Seated;20 reps;Both;Strengthening;AROM Long Arc Quad: Seated;20 reps;Both;Strengthening;AROM Hip  Flexion/Marching: Seated;10 reps;Both;Strengthening;AROM (x2) Toe Raises: Standing;5 reps;Both;Strengthening;AROM (Feet hurt, so discontinued) Mini-Sqauts: Standing;5 reps;Both;Strengthening;AROM (Pt sat after 5 due to fatigue) End of Session PT - End of Session Equipment Utilized During Treatment: Gait belt Activity Tolerance: Patient limited by fatigue Patient left: in chair;with call bell in reach Nurse Communication: Mobility status for transfers;Mobility status for ambulation General Behavior During Session: [facility] for tasks performed Cognition: Impaired Cognitive Impairment: Slightly slow to process and decreased safety awareness   Iona Coach, PT [phone]  09/28/2011, 11:51 AM

## 2011-09-28 NOTE — Discharge Summary (Addendum)
Patient ID: Dominic Pineda MRN: [id number] DOB/AGE: Jan 23, 1951 61 y.o.  Admit date: 09/22/2011 Discharge date: 09/28/2011  Primary Care Physician: follow with PCP in [place] [facility]  Discharge Diagnoses:     Principal Problem:  *Obstructive uropathy  Active Problems:  ARF (acute renal failure)  Diarrhea  Nausea & vomiting  Hyperkalemia  Polyuria  Hypertensive emergency  Bipolar 1 disorder  Shuffling gait  Weakness generalized  H/O cocaine abuse   Medication List  As of 09/28/2011 11:01 AM   TAKE these medications         acetaminophen 325 MG tablet   Commonly known as: TYLENOL   Take 650 mg by mouth 2 (two) times daily as needed. For pain.      amantadine 100 MG capsule   Commonly known as: SYMMETREL   Take 100 mg by mouth 2 (two) times daily. To prevent side effects of Abilify      ARIPiprazole 30 MG tablet   Commonly known as: ABILIFY   Take 30 mg by mouth at bedtime.      atenolol 25 MG tablet   Commonly known as: TENORMIN   Take 25 mg by mouth 2 (two) times daily.      multivitamin with minerals tablet   Take 1 tablet by mouth daily.      oxybutynin 10 MG 24 hr tablet   Commonly known as: DITROPAN-XL   Take 20 mg by mouth at bedtime.      Tamsulosin HCl 0.4 MG Caps   Commonly known as: FLOMAX   Take 1 capsule (0.4 mg total) by mouth daily after supper.      venlafaxine 150 MG 24 hr capsule   Commonly known as: EFFEXOR-XR   Take 150 mg by mouth daily.      verapamil 240 MG CR tablet   Commonly known as: CALAN-SR   Take 240 mg by mouth every morning.      Vitamin D (Ergocalciferol) 50000 UNITS Caps   Commonly known as: DRISDOL   Take 50,000 Units by mouth every 7 (seven) days. On Friday            Disposition and Follow-up:  D/C to SNF  he follows up with PCP in [place]  [facility] and should follow up there in 1-2 weeks Follow up appt scheduled with Dr Sherron Monday ( urology) on 3/11 at 9:15 am  Consults:  none  Significant Diagnostic  Studies:  Mr Brain Wo Contrast  09/23/2011  *RADIOLOGY REPORT*  Clinical Data: Recent discharge from drug rehab program for cocaine abuse.  Progressive weakness with profuse nausea and vomiting. Profuse urination and thirst.  Bipolar disorder.  MRI HEAD WITHOUT CONTRAST  Technique:  Multiplanar, multiecho pulse sequences of the brain and surrounding structures were obtained according to standard protocol without intravenous contrast.  Comparison: MRI brain 08/23/2009. CT head 08/22/2009.  Findings: No evidence for acute stroke, acute hemorrhage, mass lesion, or extra-axial fluid.  Marked ventricular enlargement felt secondary to cerebral atrophy.  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  Chronic microvascular ischemic change affects the periventricular and subcortical white matter. Major intracranial vessels structures appear patent.  There is no acute sinus or mastoid disease.  Mild cervical spondylosis.  No posterior pituitary lesions are identified.  There is no focal lesion in the hypothalamus, although an enlarged third ventricle can be associated with hypothalamic atrophy.  Posterior pituitary bright spot was not well seen on prior MR with no clear neurohypophysis related T1 shortening  today. Pituitary stalk appears midline.  Although a dedicated pituitary study was not performed, there is a 4 mm right paramedian hyperdense structure identified in the anterior gland, unchanged from 2011.  This could represent an incidental microadenoma or pituitary cyst, but does not clearly affect the posterior pituitary.  IMPRESSION: Atrophy and small vessel disease.  No acute intracranial findings. Large remote right MCA territory infarct.  No posterior pituitary, parasellar, or hypothalamic focal abnormality is observed.  Original Report Authenticated By: Elsie Stain, M.D.    Brief H and P: For complete details please refer to admission H and P, but in brief 61 y.o. male who just recently  was discharged from a drug rehab program for cocaine abuse 4 days ago and was brought to there ED secondary to progressive weakness, profuse nausea, vomiting and diarrhea X 2 days. Patient also has had profuse urination and thirst. Family reports that he was having increased weakness and difficulty getting out of bed. He was also described as having a shuffling gait which was different from before. The patient has a history of Bipolar disorder, but reports not being on lithium in many years.    Physical Exam on Discharge:  Filed Vitals:   09/27/11 0934 09/27/11 1353 09/27/11 2200 09/28/11 0600  BP: 143/82 120/56 139/75 151/76  Pulse: 76 70 75 72  Temp:  98.1 F (36.7 C) 98.6 F (37 C) 97.6 F (36.4 C)  TempSrc:  Oral Oral Oral  Resp:  18 19 19   Height:      Weight:      SpO2:  93% 95% 96%     Intake/Output Summary (Last 24 hours) at 09/28/11 1101 Last data filed at 09/28/11 0600  Gross per 24 hour  Intake    720 ml  Output   2275 ml  Net  -1555 ml    General: elderly poorly groomed male in no acute distress.  HEENT: no pallor, no icterus, moist oral mucosa, no JVD, no lymphadenopathy  Heart: Normal s1 &s2 Regular rate and rhythm, without murmurs, rubs, gallops.  Lungs: Clear to auscultation bilaterally.  Abdomen: Soft, nontender, nondistended, positive bowel sounds.foley in place draining clear urine  Extremities: No clubbing cyanosis or edema with positive pedal pulses.  Neuro: Alert, awake, oriented x3, nonfocal.  CBC:    Component Value Date/Time   WBC 9.5 09/28/2011 0530   HGB 11.9* 09/28/2011 0530   HCT 35.8* 09/28/2011 0530   PLT 298 09/28/2011 0530   MCV 86.7 09/28/2011 0530   NEUTROABS 6.7 09/22/2011 2030   LYMPHSABS 3.5 09/22/2011 2030   MONOABS 0.8 09/22/2011 2030   EOSABS 0.3 09/22/2011 2030   BASOSABS 0.1 09/22/2011 2030    Basic Metabolic Panel:    Component Value Date/Time   NA 136 09/28/2011 0530   K 3.9 09/28/2011 0530   CL 101 09/28/2011 0530   CO2 25 09/28/2011  0530   BUN 24* 09/28/2011 0530   CREATININE 1.37* 09/28/2011 0530   GLUCOSE 98 09/28/2011 0530   CALCIUM 9.1 09/28/2011 0530    Hospital Course:   obstructive uropathy -he has a normal baseline creatinine  He presented secondary to progressive weakness, profuse nausea, vomiting and diarrhea X 2 days. Patient also has had profuse urination and thirst. Family reports that he was having increased weakness and difficulty getting out of bed. Patient noted for AKI likely due to obstructive uropathy and improved after foley placed in.  -he Presented with a creatinine of 12 which significantly improved after foley  placed in  has now improved to 1.37 today  Renal US negative for hydronephrosis. PSA wnl. Patient gives hx of symptoms BPH including urgency, hesitancy and poor stream for past 5-6 months  discussed with urology Dr Sherron Monday And recommended discharging patient on foley for now and follow up with urology and appt scheduled for 3/11 at 9; 15 am  started on flomax   Hyperkalemia  likely associated with his AKI and now resolved   Polyuria  likely related to high output acute renal failure  fsg wnl  Has had over 11 L of urine output since admission   N/V  resolved, possibly related to uremia   HTN  controlled  *Continue metoprolol and verapamil which are the patient's home medications   Bipolar D/O  *Home clonazepam placed on hold due to altered mentation from azotemia and uremia  *resumed clonazepam as well as venlafaxine   History of Cocaine abuse  *Urine drug screen negative   DISPO:  discussed with daughter who feels patient's mother will not be able to take care of him at this time at home and he has issues with medication non compliance. Agree that he would benefit from going to SNF and will be discharged to [facility] today with outpt follow up. He needs to have his creatinine checked in next few days.  He is being discharged on foely catheter until seen by  urologist     Time spent on Discharge: 45 minutes  Signed: Eddie North 09/28/2011, 11:01 AM

## 2011-09-28 NOTE — Progress Notes (Signed)
   CARE MANAGEMENT NOTE 09/28/2011  Patient:  Dominic Pineda, Dominic Pineda   Account Number:  [id number]  Date Initiated:  09/24/2011  Documentation initiated by:  Onnie Boer  Subjective/Objective Assessment:   PT WAS ADMITTED WITH ARF     Action/Plan:   PROGRESSION OF CARE AND DISCHARGE PLANNING   Anticipated DC Date:  09/27/2011   Anticipated DC Plan:  SKILLED NURSING FACILITY  In-house referral  Clinical Social Worker      DC Planning Services  CM consult      Choice offered to / List presented to:             Status of service:  Completed, signed off Medicare Important Message given?   (If response is "NO", the following Medicare IM given date fields will be blank) Date Medicare IM given:   Date Additional Medicare IM given:    Discharge Disposition:  SKILLED NURSING FACILITY  Per UR Regulation:  Reviewed for med. necessity/level of care/duration of stay  Comments:  09/28/11 17:06 Letha Cape RN, BSN [phone] pt dc to snf today, CSW facilitated.   UR COMPLETED.  Onnie Boer, RN,BSN 09/14/11 1613 PT WAS ADMITTED WITH ARF FROM HOME WITH SELF CARE.  PT WAS RECENTLY DC'D FROM DRUG REHAB AND WAS POSITIVE THIS ADMISSION, WILL NOTIFY CSW.  WILL F/U ON DC NEEDS

## 2011-09-28 NOTE — Progress Notes (Signed)
Clinical Social Worker met with patient to discuss bed offers along with placement at SNF.  Patient still agreeable and received one offer at [facility], confirmed offer and agreeable to accept patient at the end of the day for ST placement.  Passar with 30 day note was submitted along with Fl2.  All documents signed and in shadow chart.  Awaiting passar to send patient.  Met with daughter who was also agreeable to placement at SNF and so was wife.  Patient resting comfortably in room.  Anticipating dc this afternoon once passar has been received.  Will continue to follow.  Ashley Jacobs, MSW LCSW [phone]  Coverage for team 2.

## 2011-09-28 NOTE — Progress Notes (Signed)
Clinical Social Worker following patient.  Patient medically stable for dc and will go to SNF at dc.  passar received and faxed along with dc summary.  Family and patient agreeable to dc and will transport by EMS.  Patient admitted to [facility].  No other needs.  Anticipate dc around 3pm today.  Ashley Jacobs, MSW LCSW [phone]

## 2011-10-17 ENCOUNTER — Telehealth: Payer: Self-pay | Admitting: Internal Medicine

## 2011-10-19 NOTE — Telephone Encounter (Signed)
No telephone call needed 

## 2011-10-30 ENCOUNTER — Emergency Department (HOSPITAL_COMMUNITY): Payer: Medicare Other

## 2011-10-30 ENCOUNTER — Inpatient Hospital Stay (HOSPITAL_COMMUNITY)
Admission: EM | Admit: 2011-10-30 | Discharge: 2011-11-02 | DRG: 690 | Disposition: A | Discharge status: Home / Self Care | Payer: Medicare Other | Attending: Internal Medicine | Admitting: Internal Medicine

## 2011-10-30 ENCOUNTER — Encounter (HOSPITAL_COMMUNITY): Payer: Self-pay

## 2011-10-30 DIAGNOSIS — N139 Obstructive and reflux uropathy, unspecified: Secondary | ICD-10-CM

## 2011-10-30 DIAGNOSIS — R29810 Facial weakness: Secondary | ICD-10-CM | POA: Diagnosis present

## 2011-10-30 DIAGNOSIS — F319 Bipolar disorder, unspecified: Secondary | ICD-10-CM | POA: Diagnosis present

## 2011-10-30 DIAGNOSIS — E785 Hyperlipidemia, unspecified: Secondary | ICD-10-CM | POA: Diagnosis present

## 2011-10-30 DIAGNOSIS — R197 Diarrhea, unspecified: Secondary | ICD-10-CM

## 2011-10-30 DIAGNOSIS — F1411 Cocaine abuse, in remission: Secondary | ICD-10-CM

## 2011-10-30 DIAGNOSIS — R32 Unspecified urinary incontinence: Secondary | ICD-10-CM | POA: Diagnosis present

## 2011-10-30 DIAGNOSIS — I129 Hypertensive chronic kidney disease with stage 1 through stage 4 chronic kidney disease, or unspecified chronic kidney disease: Secondary | ICD-10-CM | POA: Diagnosis present

## 2011-10-30 DIAGNOSIS — I161 Hypertensive emergency: Secondary | ICD-10-CM

## 2011-10-30 DIAGNOSIS — I10 Essential (primary) hypertension: Secondary | ICD-10-CM | POA: Diagnosis present

## 2011-10-30 DIAGNOSIS — N390 Urinary tract infection, site not specified: Principal | ICD-10-CM | POA: Diagnosis present

## 2011-10-30 DIAGNOSIS — N183 Chronic kidney disease, stage 3 unspecified: Secondary | ICD-10-CM | POA: Diagnosis present

## 2011-10-30 DIAGNOSIS — R531 Weakness: Secondary | ICD-10-CM | POA: Diagnosis present

## 2011-10-30 DIAGNOSIS — I498 Other specified cardiac arrhythmias: Secondary | ICD-10-CM | POA: Diagnosis present

## 2011-10-30 DIAGNOSIS — N189 Chronic kidney disease, unspecified: Secondary | ICD-10-CM | POA: Diagnosis present

## 2011-10-30 DIAGNOSIS — R2689 Other abnormalities of gait and mobility: Secondary | ICD-10-CM

## 2011-10-30 DIAGNOSIS — Z8673 Personal history of transient ischemic attack (TIA), and cerebral infarction without residual deficits: Secondary | ICD-10-CM

## 2011-10-30 DIAGNOSIS — N179 Acute kidney failure, unspecified: Secondary | ICD-10-CM

## 2011-10-30 DIAGNOSIS — R5383 Other fatigue: Secondary | ICD-10-CM | POA: Diagnosis present

## 2011-10-30 DIAGNOSIS — R112 Nausea with vomiting, unspecified: Secondary | ICD-10-CM

## 2011-10-30 DIAGNOSIS — R358 Other polyuria: Secondary | ICD-10-CM

## 2011-10-30 DIAGNOSIS — R5381 Other malaise: Secondary | ICD-10-CM | POA: Diagnosis present

## 2011-10-30 DIAGNOSIS — E875 Hyperkalemia: Secondary | ICD-10-CM

## 2011-10-30 HISTORY — DX: Benign prostatic hyperplasia without lower urinary tract symptoms: N40.0

## 2011-10-30 HISTORY — DX: Personal history of other diseases of urinary system: Z87.448

## 2011-10-30 LAB — URINE MICROSCOPIC-ADD ON

## 2011-10-30 LAB — COMPREHENSIVE METABOLIC PANEL
Albumin: 3.9 g/dL (ref 3.5–5.2)
Alkaline Phosphatase: 113 U/L (ref 39–117)
BUN: 18 mg/dL (ref 6–23)
CO2: 24 mEq/L (ref 19–32)
Chloride: 104 mEq/L (ref 96–112)
Glucose, Bld: 121 mg/dL — ABNORMAL HIGH (ref 70–99)
Potassium: 3.7 mEq/L (ref 3.5–5.1)
Total Bilirubin: 0.6 mg/dL (ref 0.3–1.2)

## 2011-10-30 LAB — CBC
Hemoglobin: 14.3 g/dL (ref 13.0–17.0)
RBC: 4.85 MIL/uL (ref 4.22–5.81)

## 2011-10-30 LAB — PROTIME-INR: Prothrombin Time: 14.1 seconds (ref 11.6–15.2)

## 2011-10-30 LAB — DIFFERENTIAL
Lymphocytes Relative: 42 % (ref 12–46)
Lymphs Abs: 3.2 10*3/uL (ref 0.7–4.0)
Monocytes Relative: 7 % (ref 3–12)
Neutro Abs: 3.6 10*3/uL (ref 1.7–7.7)
Neutrophils Relative %: 48 % (ref 43–77)

## 2011-10-30 LAB — URINALYSIS, ROUTINE W REFLEX MICROSCOPIC
Glucose, UA: NEGATIVE mg/dL
Hgb urine dipstick: NEGATIVE
Protein, ur: NEGATIVE mg/dL

## 2011-10-30 MED ORDER — ARIPIPRAZOLE 15 MG PO TABS
30.0000 mg | ORAL_TABLET | Freq: Every day | ORAL | Status: DC
  Administered 2011-10-30 – 2011-11-01 (×3): 30 mg via ORAL
  Filled 2011-10-30 (×5): qty 2

## 2011-10-30 MED ORDER — HYDRALAZINE HCL 20 MG/ML IJ SOLN
10.0000 mg | Freq: Four times a day (QID) | INTRAMUSCULAR | Status: DC | PRN
  Administered 2011-10-30 – 2011-11-01 (×2): 10 mg via INTRAVENOUS
  Filled 2011-10-30 (×2): qty 0.5

## 2011-10-30 MED ORDER — OXYBUTYNIN CHLORIDE ER 10 MG PO TB24
20.0000 mg | ORAL_TABLET | Freq: Every day | ORAL | Status: DC
  Administered 2011-10-30 – 2011-11-01 (×3): 20 mg via ORAL
  Filled 2011-10-30 (×4): qty 2

## 2011-10-30 MED ORDER — ACETAMINOPHEN 650 MG RE SUPP: 650.0000 mg | RECTAL | Status: DC | PRN

## 2011-10-30 MED ORDER — SODIUM CHLORIDE 0.9 % IV SOLN
Freq: Once | INTRAVENOUS | Status: AC
  Administered 2011-10-30: 14:00:00 via INTRAVENOUS

## 2011-10-30 MED ORDER — AMANTADINE HCL 100 MG PO CAPS
100.0000 mg | ORAL_CAPSULE | Freq: Two times a day (BID) | ORAL | Status: DC
  Administered 2011-10-30 – 2011-11-02 (×6): 100 mg via ORAL
  Filled 2011-10-30 (×7): qty 1

## 2011-10-30 MED ORDER — ACETAMINOPHEN 325 MG PO TABS: 650.0000 mg | ORAL_TABLET | ORAL | Status: DC | PRN

## 2011-10-30 MED ORDER — ASPIRIN 300 MG RE SUPP
300.0000 mg | Freq: Every day | RECTAL | Status: DC
  Filled 2011-10-30 (×4): qty 1

## 2011-10-30 MED ORDER — ATENOLOL 25 MG PO TABS
25.0000 mg | ORAL_TABLET | Freq: Two times a day (BID) | ORAL | Status: DC
  Administered 2011-10-30 – 2011-11-02 (×6): 25 mg via ORAL
  Filled 2011-10-30 (×7): qty 1

## 2011-10-30 MED ORDER — DEXTROSE 5 % IV SOLN
1.0000 g | INTRAVENOUS | Status: DC
  Administered 2011-10-30 – 2011-10-31 (×2): 1 g via INTRAVENOUS
  Filled 2011-10-30 (×3): qty 10

## 2011-10-30 MED ORDER — SODIUM CHLORIDE 0.9 % IV SOLN
INTRAVENOUS | Status: DC
  Administered 2011-10-30: via INTRAVENOUS

## 2011-10-30 MED ORDER — VITAMIN D (ERGOCALCIFEROL) 1.25 MG (50000 UNIT) PO CAPS
50000.0000 [IU] | ORAL_CAPSULE | ORAL | Status: DC
  Administered 2011-11-02: 50000 [IU] via ORAL
  Filled 2011-10-30: qty 1

## 2011-10-30 MED ORDER — ONDANSETRON HCL 4 MG/2ML IJ SOLN: 4.0000 mg | Freq: Four times a day (QID) | INTRAMUSCULAR | Status: DC | PRN

## 2011-10-30 MED ORDER — SENNOSIDES-DOCUSATE SODIUM 8.6-50 MG PO TABS
1.0000 | ORAL_TABLET | Freq: Every evening | ORAL | Status: DC | PRN
  Administered 2011-11-01: 1 via ORAL
  Filled 2011-10-30: qty 1

## 2011-10-30 MED ORDER — VERAPAMIL HCL ER 240 MG PO TBCR
240.0000 mg | EXTENDED_RELEASE_TABLET | Freq: Every morning | ORAL | Status: DC
  Administered 2011-10-31 – 2011-11-02 (×3): 240 mg via ORAL
  Filled 2011-10-30 (×3): qty 1

## 2011-10-30 MED ORDER — VENLAFAXINE HCL ER 150 MG PO CP24
150.0000 mg | ORAL_CAPSULE | Freq: Every day | ORAL | Status: DC
  Administered 2011-10-31 – 2011-11-02 (×3): 150 mg via ORAL
  Filled 2011-10-30 (×3): qty 1

## 2011-10-30 MED ORDER — TAMSULOSIN HCL 0.4 MG PO CAPS
0.4000 mg | ORAL_CAPSULE | Freq: Every day | ORAL | Status: DC
  Administered 2011-10-30 – 2011-11-01 (×2): 0.4 mg via ORAL
  Filled 2011-10-30 (×4): qty 1

## 2011-10-30 MED ORDER — ASPIRIN 325 MG PO TABS
325.0000 mg | ORAL_TABLET | Freq: Every day | ORAL | Status: DC
  Administered 2011-10-30 – 2011-11-02 (×4): 325 mg via ORAL
  Filled 2011-10-30 (×4): qty 1

## 2011-10-30 MED ORDER — ADULT MULTIVITAMIN W/MINERALS CH
1.0000 | ORAL_TABLET | Freq: Every day | ORAL | Status: DC
  Administered 2011-10-31 – 2011-11-02 (×3): 1 via ORAL
  Filled 2011-10-30 (×3): qty 1

## 2011-10-30 MED ORDER — DEXTROSE 5 % IV SOLN
1.0000 g | INTRAVENOUS | Status: DC
  Administered 2011-10-30: 1 g via INTRAVENOUS
  Filled 2011-10-30: qty 10

## 2011-10-30 NOTE — H&P (Signed)
PATIENT DETAILS Name: Dominic Pineda Age: 61 y.o. Sex: male Date of Birth: 1951/04/24 Admit Date: 10/30/2011 ONG:[id number] Not In System   CHIEF COMPLAINT:  Facial droop X 3days   HPI: Patient is a 61 year old African American male with a past medical history of CVA claims to have no residual deficits from this, hypertension, bipolar disorder, BPH who presents to the hospital with the above-noted complaints. This patient was discharged from this hospital last month and was sent to a skilled nursing facility for rehabilitation, he claims he was discharged from there around 2 weeks ago. He claims for the past 3 days he has had left facial droop and drooling from the face. He claims that he feels weak all over, however does not have any focal weakness. He denies any headache or back pain. He claims that he now has incontinence of both bowel and urine. Apparently this has happened in the past as well. Review of his medication list sure that the patient is on oxybutynin. During my evaluation, I did not notice any left facial droop, speech is slow with very minimal slurring not sure whether this is his baseline. Patient denies any fever, denies any dysuria, he denies any diarrhea. He does not have any abdominal pain or chest pain or shortness of breath. He is now being admitted to the hospitalist service for further evaluation and treatment    ALLERGIES:   Allergies  Allergen Reactions  . Codeine Nausea Only    PAST MEDICAL HISTORY: Past Medical History  Diagnosis Date  . Hypertension   . Bipolar 1 disorder   . Incontinence   . Stroke   . Mental disorder   . Depression   . Anxiety   . Blood transfusion 1970's  . Shortness of breath     has had a recent increase in episodes of shortness of breath and hyperventilating    PAST SURGICAL HISTORY: Past Surgical History  Procedure Date  . Hernia repair     MEDICATIONS AT HOME: Prior to Admission medications   Medication Sig Start Date  End Date Taking? Authorizing Provider  acetaminophen (TYLENOL) 325 MG tablet Take 650 mg by mouth 2 (two) times daily as needed. For pain.   Yes Historical Provider, MD  amantadine (SYMMETREL) 100 MG capsule Take 100 mg by mouth 2 (two) times daily. To prevent side effects of Abilify   Yes Historical Provider, MD  ARIPiprazole (ABILIFY) 30 MG tablet Take 30 mg by mouth at bedtime.   Yes Historical Provider, MD  atenolol (TENORMIN) 25 MG tablet Take 25 mg by mouth 2 (two) times daily.   Yes Historical Provider, MD  Multiple Vitamins-Minerals (MULTIVITAMIN WITH MINERALS) tablet Take 1 tablet by mouth daily.   Yes Historical Provider, MD  oxybutynin (DITROPAN-XL) 10 MG 24 hr tablet Take 20 mg by mouth at bedtime.   Yes Historical Provider, MD  Tamsulosin HCl (FLOMAX) 0.4 MG CAPS Take 1 capsule (0.4 mg total) by mouth daily after supper. 09/28/11  Yes Nishant Dhungel, MD  venlafaxine (EFFEXOR-XR) 150 MG 24 hr capsule Take 150 mg by mouth daily.   Yes Historical Provider, MD  verapamil (CALAN-SR) 240 MG CR tablet Take 240 mg by mouth every morning.   Yes Historical Provider, MD  Vitamin D, Ergocalciferol, (DRISDOL) 50000 UNITS CAPS Take 50,000 Units by mouth every 7 (seven) days. On Friday    Historical Provider, MD    FAMILY HISTORY: Family History  Problem Relation Age of Onset  . Coronary artery disease Father  SOCIAL HISTORY:  reports that he has quit smoking. His smoking use included Cigarettes. He has never used smokeless tobacco. He reports that he drinks alcohol. He reports that he uses illicit drugs ("Crack" cocaine and Marijuana).  REVIEW OF SYSTEMS:  Constitutional:   No  weight loss, night sweats,  Fevers, chills, fatigue.  HEENT:    No headaches, Difficulty swallowing,Tooth/dental problems,Sore throat,  No sneezing, itching, ear ache, nasal congestion, post nasal drip,   Cardio-vascular: No chest pain,  Orthopnea, PND, swelling in lower extremities, anasarca,  dizziness,  palpitations  GI:  No heartburn, indigestion, abdominal pain, nausea, vomiting, diarrhea, change in bowel habits, loss of appetite  Resp: No shortness of breath with exertion or at rest.  No excess mucus, no productive cough, No non-productive cough,  No coughing up of blood.No change in color of mucus.No wheezing.No chest wall deformity  Skin:  no rash or lesions.  GU:  no dysuria, change in color of urine, no urgency or frequency.  No flank pain.  Musculoskeletal: No joint pain or swelling.  No decreased range of motion.  No back pain.  Psych: No change in mood or affect. No depression or anxiety.  No memory loss.   PHYSICAL EXAM: Blood pressure 174/88, pulse 91, temperature 98.1 F (36.7 C), temperature source Oral, resp. rate 16, height 5\' 10"  (1.778 m), weight 108.41 kg (239 lb), SpO2 98.00%.  General appearance :Awake, alert, not in any distress. Speech is slow with minimal slurring. Not toxic Looking HEENT: Atraumatic and Normocephalic, pupils equally reactive to light and accomodation Neck: supple, no JVD. No cervical lymphadenopathy.  Chest:Good air entry bilaterally, no added sounds  CVS: S1 S2 regular, no murmurs.  Abdomen: Bowel sounds present, Non tender and not distended with no gaurding, rigidity or rebound. Extremities: B/L Lower Ext shows no edema, both legs are warm to touch, with  dorsalis pedis pulses palpable. Neurology: Awake alert, and oriented X 3, CN II-XII intact, Non focal, Skin:No Rash Wounds:N/A  LABS ON ADMISSION:   Basename 10/30/11 1411  NA 140  K 3.7  CL 104  CO2 24  GLUCOSE 121*  BUN 18  CREATININE 1.41*  CALCIUM 9.4  MG --  PHOS --    Basename 10/30/11 1411  AST 18  ALT 14  ALKPHOS 113  BILITOT 0.6  PROT 7.8  ALBUMIN 3.9   No results found for this basename: LIPASE:2,AMYLASE:2 in the last 72 hours  Basename 10/30/11 1411  WBC 7.6  NEUTROABS 3.6  HGB 14.3  HCT 41.4  MCV 85.4  PLT 264   No results found for this  basename: CKTOTAL:3,CKMB:3,CKMBINDEX:3,TROPONINI:3 in the last 72 hours No results found for this basename: DDIMER:2 in the last 72 hours No components found with this basename: POCBNP:3   RADIOLOGIC STUDIES ON ADMISSION: Dg Chest 2 View  10/30/2011  *RADIOLOGY REPORT*  Clinical Data: Altered level of consciousness.  Pain.  CHEST - 2 VIEW  Comparison: Two-view chest 08/22/2009.  Findings: Mild cardiomegaly is stable.  The right hemidiaphragm remains elevated.  Bibasilar atelectasis is noted.  No other focal airspace disease is evident.  The visualized soft tissues and bony thorax are unremarkable.  IMPRESSION:  1.  Stable borderline cardiomegaly without failure. 2.  Stable elevation of the right hemidiaphragm with right basilar atelectasis.  Original Report Authenticated By: Jamesetta Orleans. MATTERN, M.D.   Ct Head Wo Contrast  10/30/2011  *RADIOLOGY REPORT*  Clinical Data: Altered mental status for 3 days.  Diffuse weakness.  CT HEAD WITHOUT  CONTRAST  Technique:  Contiguous axial images were obtained from the base of the skull through the vertex without contrast.  Comparison: MRI brain without contrast 09/23/2011.  Findings: Encephalomalacia of the right frontal and temporal lobe is stable.  There is ex vacuo dilation.  No new cortical infarct, hemorrhage, or mass lesion is present.  The ventricles are stable in size and proportionate to the degree of atrophy.  No significant extra-axial fluid collection is present.  The paranasal sinuses and mastoid air cells are clear. Atherosclerotic calcifications are present within the cavernous carotid arteries bilaterally.  The osseous skull is intact.  IMPRESSION:  1.  Stable encephalomalacia of the right frontal and temporal lobes. 2.  No acute intracranial abnormality or significant interval change. 3.  Moderate diffuse atrophy.  Original Report Authenticated By: Jamesetta Orleans. MATTERN, M.D.    ASSESSMENT AND PLAN: Present on Admission:  .Facial  droop -Apparently this has been going on for 3 days, but not seen during my exam. This is likely possible to UTI or even  From his medications. However patient has had a history of CVA in the past and with his age being slightly slurred I will go at that MRI, M.D. MRI of the brain is positive then we will consider pursuing further stroke workup. In the meantime stop aspirin.   Marland KitchenHypertension -Uncontrolled in the ED  -Restart his atenolol and verapamil  -Use as needed hydralazine   .Incontinence of both urine and stool  -Currently has a Foley catheter in place-a mild traumatic hematuria, this easily from UTI as a result we'll we will place the patient on IV Rocephin.in his last admission he had a Foley catheter in place after 2 weeks, as needed for a voiding trial prior to discharge.   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 Secondary due to UTI -PT/OT   Further plan will depend as patient's clinical course evolves and further radiologic and laboratory data become available. Patient will be monitored closely.  DVT Prophylaxis: -Has mild traumatic hematuria, will place on SCDs for now  Code Status: Full code  Total time spent for admission equals 45 minutes.  Jeoffrey Massed 10/30/2011, 5:15 PM

## 2011-10-30 NOTE — ED Notes (Signed)
3020-01 Ready

## 2011-10-30 NOTE — ED Notes (Signed)
Pt presents drooling and facial droop x 3 days.  Sister reports she saw pt normal 2-3 days ago.  Pt reports urinary incontinence, reports difficulty walking.

## 2011-10-30 NOTE — ED Notes (Signed)
#  14 foley cath inserted. Procedure explained to patient, sterile technique used, patient tolerated well. Dark yellow urine returned.

## 2011-10-30 NOTE — ED Provider Notes (Signed)
History     CSN: [id number]  Arrival date & time 10/30/11  1355   First MD Initiated Contact with Patient 10/30/11 1409      Chief Complaint  Patient presents with  . Altered Mental Status    (Consider location/radiation/quality/duration/timing/severity/associated sxs/prior treatment) Patient is a 61 y.o. male presenting with altered mental status. The history is provided by the patient and a relative.  Altered Mental Status   patient here with altered mental status x3 days. Patient has had urinary and bowel incontinence. He also had diffuse weakness. No recent fevers, vomiting, diarrhea or cough. Possible facial drooping on the left side. Patient was normal 3 days ago. No recent falls. Nothing makes her symptoms better or worse. Patient has a history of CVA  Past Medical History  Diagnosis Date  . Hypertension   . Bipolar 1 disorder   . Incontinence   . Stroke   . Mental disorder   . Depression   . Anxiety   . Blood transfusion 1970's  . Shortness of breath     has had a recent increase in episodes of shortness of breath and hyperventilating    Past Surgical History  Procedure Date  . Hernia repair     Family History  Problem Relation Age of Onset  . Coronary artery disease Father     History  Substance Use Topics  . Smoking status: Former Smoker    Types: Cigarettes  . Smokeless tobacco: Never Used  . Alcohol Use: Yes      Review of Systems  Psychiatric/Behavioral: Positive for altered mental status.  All other systems reviewed and are negative.    Allergies  Codeine  Home Medications   Current Outpatient Rx  Name Route Sig Dispense Refill  . ACETAMINOPHEN 325 MG PO TABS Oral Take 650 mg by mouth 2 (two) times daily as needed. For pain.    . AMANTADINE HCL 100 MG PO CAPS Oral Take 100 mg by mouth 2 (two) times daily. To prevent side effects of Abilify    . ARIPIPRAZOLE 30 MG PO TABS Oral Take 30 mg by mouth at bedtime.    . ATENOLOL 25 MG PO TABS  Oral Take 25 mg by mouth 2 (two) times daily.    . MULTI-VITAMIN/MINERALS PO TABS Oral Take 1 tablet by mouth daily.    . OXYBUTYNIN CHLORIDE ER 10 MG PO TB24 Oral Take 20 mg by mouth at bedtime.    . TAMSULOSIN HCL 0.4 MG PO CAPS Oral Take 1 capsule (0.4 mg total) by mouth daily after supper. 30 capsule 2  . VENLAFAXINE HCL ER 150 MG PO CP24 Oral Take 150 mg by mouth daily.    Marland Kitchen VERAPAMIL HCL ER 240 MG PO TBCR Oral Take 240 mg by mouth every morning.    Marland Kitchen VITAMIN D (ERGOCALCIFEROL) 50000 UNITS PO CAPS Oral Take 50,000 Units by mouth every 7 (seven) days. On Friday      BP 172/82  Pulse 82  Temp(Src) 98.1 F (36.7 C) (Oral)  Resp 17  Ht 5\' 10"  (1.778 m)  Wt 239 lb (108.41 kg)  BMI 34.29 kg/m2  SpO2 95%  Physical Exam  Nursing note and vitals reviewed. Constitutional: He is oriented to person, place, and time. He appears well-developed and well-nourished.  Non-toxic appearance. No distress.  HENT:  Head: Normocephalic and atraumatic.  Eyes: Conjunctivae, EOM and lids are normal. Pupils are equal, round, and reactive to light.  Neck: Normal range of motion. Neck supple. No  tracheal deviation present. No mass present.  Cardiovascular: Normal rate, regular rhythm and normal heart sounds.  Exam reveals no gallop.   No murmur heard. Pulmonary/Chest: Effort normal and breath sounds normal. No stridor. No respiratory distress. He has no decreased breath sounds. He has no wheezes. He has no rhonchi. He has no rales.  Abdominal: Soft. Normal appearance and bowel sounds are normal. He exhibits no distension. There is no tenderness. There is no rebound and no CVA tenderness.  Musculoskeletal: Normal range of motion. He exhibits no edema and no tenderness.  Neurological: He is alert and oriented to person, place, and time. He has normal strength. He displays no tremor. No cranial nerve deficit or sensory deficit. He exhibits normal muscle tone. He displays no seizure activity. GCS eye subscore is  4. GCS verbal subscore is 5. GCS motor subscore is 6.  Skin: Skin is warm and dry. No abrasion and no rash noted.  Psychiatric: His speech is normal. His affect is blunt. He is slowed.    ED Course  Procedures (including critical care time)   Labs Reviewed  CBC  DIFFERENTIAL  COMPREHENSIVE METABOLIC PANEL  PROTIME-INR  APTT  URINALYSIS, ROUTINE W REFLEX MICROSCOPIC  URINE CULTURE   No results found.   No diagnosis found.    MDM  Patient started on Rocephin and will be admitted by triad   Toy Baker, MD 10/30/11 1557

## 2011-10-30 NOTE — ED Notes (Signed)
Talbert Forest, patient's sister.  [phone], [place], mother  [phone]

## 2011-10-30 NOTE — ED Notes (Signed)
Patient pulling on foley cath resulting in bloody urine x 50 ml.  Urine now clearing of blood tinged color. MD aware.

## 2011-10-31 ENCOUNTER — Inpatient Hospital Stay (HOSPITAL_COMMUNITY): Payer: Medicare Other

## 2011-10-31 DIAGNOSIS — I369 Nonrheumatic tricuspid valve disorder, unspecified: Secondary | ICD-10-CM

## 2011-10-31 LAB — GLUCOSE, CAPILLARY: Glucose-Capillary: 171 mg/dL — ABNORMAL HIGH (ref 70–99)

## 2011-10-31 LAB — LIPID PANEL: VLDL: 16 mg/dL (ref 0–40)

## 2011-10-31 LAB — MRSA PCR SCREENING: MRSA by PCR: NEGATIVE

## 2011-10-31 MED ORDER — SODIUM CHLORIDE 0.9 % IV SOLN: INTRAVENOUS | Status: AC

## 2011-10-31 MED ORDER — SIMVASTATIN 10 MG PO TABS
10.0000 mg | ORAL_TABLET | Freq: Every day | ORAL | Status: DC
  Administered 2011-10-31 – 2011-11-01 (×2): 10 mg via ORAL
  Filled 2011-10-31 (×3): qty 1

## 2011-10-31 NOTE — Progress Notes (Signed)
Utilization Review Completed.  Dominic Pineda   10/31/2011  

## 2011-10-31 NOTE — Progress Notes (Signed)
*  PRELIMINARY RESULTS* Vascular Ultrasound Carotid Duplex (Doppler) has been completed. No evidence of internal carotid artery stenosis bilaterally. Antegrade bilateral vertebral arteries.  Malachy Moan, RDMS, RDCS 10/31/2011, 11:59 AM

## 2011-10-31 NOTE — Discharge Instructions (Signed)
[facility] [phone] rn and phy ther

## 2011-10-31 NOTE — Progress Notes (Signed)
   CARE MANAGEMENT NOTE 10/31/2011  Patient:  Dominic Pineda, Dominic Pineda   Account Number:  [id number]  Date Initiated:  10/31/2011  Documentation initiated by:  Junius Creamer  Subjective/Objective Assessment:   adm w facial droop     Action/Plan:   lives w fam9 mother and sister)   Anticipated DC Date:  11/02/2011   Anticipated DC Plan:  HOME W HOME HEALTH SERVICES      DC Planning Services  CM consult      [facility] Choice  Resumption Of Svcs/PTA Provider   Choice offered to / List presented to:          [facility] arranged  HH-1 RN  HH-2 PT      HH agency  [facility]   Status of service:   Medicare Important Message given?   (If response is "NO", the following Medicare IM given date fields will be blank) Date Medicare IM given:   Date Additional Medicare IM given:    Discharge Disposition:  HOME W HOME HEALTH SERVICES  Per UR Regulation:    If discussed at Long Length of Stay Meetings, dates discussed:    Comments:  4/3  just got out of [facility]. hopes to return home at disch.states fam is w him 24hr/day, act w [facility]-have alerted [facility] of adm. debbie Kellen Hover rn,bsn [id number]

## 2011-10-31 NOTE — Progress Notes (Signed)
Occupational Therapy Evaluation Patient Details Name: Dominic Pineda MRN: [id number] DOB: October 28, 1950 Today's Date: 10/31/2011  Problem List:  Patient Active Problem List  Diagnoses  . ARF (acute renal failure)  . Diarrhea  . Nausea & vomiting  . Hyperkalemia  . Polyuria  . Hypertensive emergency  . Bipolar 1 disorder  . Shuffling gait  . Weakness generalized  . H/O cocaine abuse  . Hypertension  . Incontinence  . Obstructive uropathy  . Facial droop  . CKD (chronic kidney disease)  . UTI (lower urinary tract infection)    Past Medical History:  Past Medical History  Diagnosis Date  . Hypertension   . Bipolar 1 disorder   . Incontinence   . Mental disorder   . Depression   . Anxiety   . Blood transfusion 1970's  . Shortness of breath     has had a recent increase in episodes of shortness of breath and hyperventilating  . Stroke 2009-2010    "have had 3 strokes"; denies residual  . BPH (benign prostatic hyperplasia)   . H/O acute renal failure 08/2011    severe w/hematuria   Past Surgical History:  Past Surgical History  Procedure Date  . Incision and drainage of wound     right knee "got piece of wire in it while mowing"  . Inguinal hernia repair     left  . Tonsillectomy     "as a child"    OT Assessment/Plan/Recommendation OT Assessment Clinical Impression Statement: Patient admitted for facial droop and slurred speech, possibly due to UTI. MRI results negative for acute infarct. Patient was discharged from this hospital last month and was sent to a skilled nursing facility for rehabilitation, he claims he was discharged from there around 2 weeks ago. Pt reports that he has been independent with ADLs and basic functional mobility at home over past 2 weeks.  Will benefit from acute OT services to address below problem list in prep for d/c home with family and 24/7 supervision. OT Recommendation/Assessment: Patient will need skilled OT in the acute care  venue OT Problem List: Decreased safety awareness;Impaired balance (sitting and/or standing);Decreased strength OT Therapy Diagnosis : Generalized weakness;Cognitive deficits OT Plan OT Frequency: Min 2X/week OT Treatment/Interventions: Self-care/ADL training;Therapeutic activities;Cognitive remediation/compensation;Patient/family education;Balance training OT Recommendation Follow Up Recommendations: Supervision/Assistance - 24 hour Equipment Recommended: Other (comment) (to be determined) Individuals Consulted Consulted and Agree with Results and Recommendations: Patient OT Goals Acute Rehab OT Goals OT Goal Formulation: With patient Time For Goal Achievement: 7 days ADL Goals Pt Will Perform Grooming: with modified independence;Standing at sink ADL Goal: Grooming - Progress: Goal set today Pt Will Transfer to Toilet: with modified independence;Ambulation;Regular height toilet ADL Goal: Toilet Transfer - Progress: Goal set today Pt Will Perform Tub/Shower Transfer: Tub transfer;Ambulation ADL Goal: Tub/Shower Transfer - Progress: Goal set today  OT Evaluation Precautions/Restrictions  Precautions Precautions: Fall Required Braces or Orthoses: No Restrictions Weight Bearing Restrictions: No Prior Functioning Home Living Lives With: Family (mother, niece, nephew, sister) Type of Home: House Home Layout: One level Home Access: Stairs to enter Entrance Stairs-Rails: Right Entrance Stairs-Number of Steps: 3 Bathroom Shower/Tub: Engineer, manufacturing systems: Standard Bathroom Accessibility: Yes How Accessible: Accessible via walker Home Adaptive Equipment: None Prior Function Level of Independence: Independent with basic ADLs;Independent with gait;Independent with transfers Able to Take Stairs?: Yes Driving: No (hasn't driven in a month) Vocation: Retired (and on disability) ADL ADL Grooming: Simulated;Set up Where Assessed - Grooming: Sitting, bed Lower Body  Dressing: Simulated;Supervision/safety Lower Body Dressing Details (indicate cue type and reason): Supervision for safety with standing. Pt able to access bil. feet. Where Assessed - Lower Body Dressing: Sit to stand from bed Toilet Transfer: Simulated;Supervision/safety Toilet Transfer Method: Stand pivot Toilet Transfer Equipment: Other (comment) (bed) Ambulation Related to ADLs: Ambulation not performed as pt insisted upon returning to bed. Vision/Perception  Vision - History Baseline Vision: No visual deficits Patient Visual Report: No change from baseline Vision - Assessment Eye Alignment: Within Functional Limits Vision Assessment: Vision tested Tracking/Visual Pursuits: Able to track stimulus in all quads without difficulty Visual Fields: No apparent deficits Cognition Cognition Arousal/Alertness: Awake/alert Overall Cognitive Status: Impaired Orientation Level: Oriented to person;Oriented to place;Oriented to time;Oriented to situation Safety/Judgement: Decreased awareness of safety precautions;Decreased safety judgement for tasks assessed Decreased Safety/Judgement: Impulsive;Decreased awareness of need for assistance Safety/Judgement - Other Comments: Pt continuosly attempting to stand from bed despite commands to remain seated EOB while OT moved catheter to other side of bed to go with pt. Cognition - Other Comments: Pt very impulsive with movements in bed and attempted several times to lay back down. Sensation/Coordination Sensation Light Touch: Appears Intact Proprioception: Appears Intact Coordination Gross Motor Movements are Fluid and Coordinated: Yes (bil. UE) Fine Motor Movements are Fluid and Coordinated: Yes (bil. UE) Extremity Assessment RUE Assessment RUE Assessment: Within Functional Limits LUE Assessment LUE Assessment: Within Functional Limits Mobility  Bed Mobility Bed Mobility: Yes Supine to Sit: 5: Supervision;HOB flat Supine to Sit Details  (indicate cue type and reason): Pt required min assist to initiate transition from supine to sit. Sitting - Scoot to Edge of Bed: 5: Supervision Sitting - Scoot to [place] of Bed Details (indicate cue type and reason): Supervision for safety due to impulsive movements. Sit to Supine: 6: Modified independent (Device/Increase time) Scooting to [facility]: 6: Modified independent (Device/Increase time) Transfers Sit to Stand: 5: Supervision;From bed;With upper extremity assist Sit to Stand Details (indicate cue type and reason): Close supervision for safety due to impulsive behavior. Stand to Sit: 5: Supervision;To bed;With upper extremity assist Stand to Sit Details: Close supervision for safety Exercises   End of Session OT - End of Session Equipment Utilized During Treatment: Gait belt Activity Tolerance: Patient tolerated treatment well Patient left: in bed;with call bell in reach;with bed alarm set Nurse Communication: Mobility status for transfers;Mobility status for ambulation General Behavior During Session: [facility] for tasks performed Cognition: Impaired Cognitive Impairment: Slightly slow to process and decreased safety awareness   3:49 PM  10/31/2011 Cipriano Mile OTR/L Pager [phone] Office [phone]

## 2011-10-31 NOTE — Progress Notes (Addendum)
Subjective: Patient seen and examined, he denies any complaints.  Objective: Vital signs in last 24 hours: Temp:  [97.5 F (36.4 C)-98.7 F (37.1 C)] 97.5 F (36.4 C) (04/03 0607) Pulse Rate:  [61-116] 79  (04/03 0607) Resp:  [16-21] 17  (04/03 0607) BP: (97-217)/(63-109) 162/96 mmHg (04/03 0607) SpO2:  [91 %-100 %] 91 % (04/03 0607) Weight:  [104.5 kg (230 lb 6.1 oz)-108.41 kg (239 lb)] 104.5 kg (230 lb 6.1 oz) (04/03 0607) Weight change:  Last BM Date: 10/30/11  Intake/Output from previous day: 04/02 0701 - 04/03 0700 In: -  Out: 600 [Urine:600]     Physical Exam: General: Alert, awake, in no acute distress. Slow speech but no slurring or dysarthria noted. Heart: Regular rate and rhythm, without murmurs, rubs, gallops. Lungs: Clear to auscultation bilaterally. Abdomen: Soft, nontender, nondistended, positive bowel sounds. Extremities: No clubbing cyanosis or edema with positive pedal pulses. Neuro: Grossly intact, nonfocal.    Lab Results: Results for orders placed during the hospital encounter of 10/30/11 (from the past 24 hour(s))  CBC     Status: Normal   Collection Time   10/30/11  2:11 PM      Component Value Range   WBC 7.6  4.0 - 10.5 (K/uL)   RBC 4.85  4.22 - 5.81 (MIL/uL)   Hemoglobin 14.3  13.0 - 17.0 (g/dL)   HCT 96.0  45.4 - 09.8 (%)   MCV 85.4  78.0 - 100.0 (fL)   MCH 29.5  26.0 - 34.0 (pg)   MCHC 34.5  30.0 - 36.0 (g/dL)   RDW 11.9  14.7 - 82.9 (%)   Platelets 264  150 - 400 (K/uL)  DIFFERENTIAL     Status: Normal   Collection Time   10/30/11  2:11 PM      Component Value Range   Neutrophils Relative 48  43 - 77 (%)   Neutro Abs 3.6  1.7 - 7.7 (K/uL)   Lymphocytes Relative 42  12 - 46 (%)   Lymphs Abs 3.2  0.7 - 4.0 (K/uL)   Monocytes Relative 7  3 - 12 (%)   Monocytes Absolute 0.5  0.1 - 1.0 (K/uL)   Eosinophils Relative 4  0 - 5 (%)   Eosinophils Absolute 0.3  0.0 - 0.7 (K/uL)   Basophils Relative 1  0 - 1 (%)   Basophils Absolute 0.1  0.0 -  0.1 (K/uL)  COMPREHENSIVE METABOLIC PANEL     Status: Abnormal   Collection Time   10/30/11  2:11 PM      Component Value Range   Sodium 140  135 - 145 (mEq/L)   Potassium 3.7  3.5 - 5.1 (mEq/L)   Chloride 104  96 - 112 (mEq/L)   CO2 24  19 - 32 (mEq/L)   Glucose, Bld 121 (*) 70 - 99 (mg/dL)   BUN 18  6 - 23 (mg/dL)   Creatinine, Ser 5.62 (*) 0.50 - 1.35 (mg/dL)   Calcium 9.4  8.4 - 13.0 (mg/dL)   Total Protein 7.8  6.0 - 8.3 (g/dL)   Albumin 3.9  3.5 - 5.2 (g/dL)   AST 18  0 - 37 (U/L)   ALT 14  0 - 53 (U/L)   Alkaline Phosphatase 113  39 - 117 (U/L)   Total Bilirubin 0.6  0.3 - 1.2 (mg/dL)   GFR calc non Af Amer 52 (*) >90 (mL/min)   GFR calc Af Amer 60 (*) >90 (mL/min)  PROTIME-INR  Status: Normal   Collection Time   10/30/11  2:11 PM      Component Value Range   Prothrombin Time 14.1  11.6 - 15.2 (seconds)   INR 1.07  0.00 - 1.49   APTT     Status: Normal   Collection Time   10/30/11  2:11 PM      Component Value Range   aPTT 33  24 - 37 (seconds)  URINALYSIS, ROUTINE W REFLEX MICROSCOPIC     Status: Abnormal   Collection Time   10/30/11  2:31 PM      Component Value Range   Color, Urine YELLOW  YELLOW    APPearance HAZY (*) CLEAR    Specific Gravity, Urine 1.019  1.005 - 1.030    pH 6.0  5.0 - 8.0    Glucose, UA NEGATIVE  NEGATIVE (mg/dL)   Hgb urine dipstick NEGATIVE  NEGATIVE    Bilirubin Urine NEGATIVE  NEGATIVE    Ketones, ur NEGATIVE  NEGATIVE (mg/dL)   Protein, ur NEGATIVE  NEGATIVE (mg/dL)   Urobilinogen, UA 0.2  0.0 - 1.0 (mg/dL)   Nitrite POSITIVE (*) NEGATIVE    Leukocytes, UA SMALL (*) NEGATIVE   URINE MICROSCOPIC-ADD ON     Status: Abnormal   Collection Time   10/30/11  2:31 PM      Component Value Range   WBC, UA 3-6  <3 (WBC/hpf)   Bacteria, UA MANY (*) RARE   LIPID PANEL     Status: Abnormal   Collection Time   10/31/11  6:35 AM      Component Value Range   Cholesterol 236 (*) 0 - 200 (mg/dL)   Triglycerides 79  <295 (mg/dL)   HDL 44  >62 (mg/dL)    Total CHOL/HDL Ratio 5.4     VLDL 16  0 - 40 (mg/dL)   LDL Cholesterol 130 (*) 0 - 99 (mg/dL)    Studies/Results: Dg Chest 2 View  10/30/2011  *RADIOLOGY REPORT*  Clinical Data: Altered level of consciousness.  Pain.  CHEST - 2 VIEW  Comparison: Two-view chest 08/22/2009.  Findings: Mild cardiomegaly is stable.  The right hemidiaphragm remains elevated.  Bibasilar atelectasis is noted.  No other focal airspace disease is evident.  The visualized soft tissues and bony thorax are unremarkable.  IMPRESSION:  1.  Stable borderline cardiomegaly without failure. 2.  Stable elevation of the right hemidiaphragm with right basilar atelectasis.  Original Report Authenticated By: Jamesetta Orleans. MATTERN, M.D.   Ct Head Wo Contrast  10/30/2011  *RADIOLOGY REPORT*  Clinical Data: Altered mental status for 3 days.  Diffuse weakness.  CT HEAD WITHOUT CONTRAST  Technique:  Contiguous axial images were obtained from the base of the skull through the vertex without contrast.  Comparison: MRI brain without contrast 09/23/2011.  Findings: Encephalomalacia of the right frontal and temporal lobe is stable.  There is ex vacuo dilation.  No new cortical infarct, hemorrhage, or mass lesion is present.  The ventricles are stable in size and proportionate to the degree of atrophy.  No significant extra-axial fluid collection is present.  The paranasal sinuses and mastoid air cells are clear. Atherosclerotic calcifications are present within the cavernous carotid arteries bilaterally.  The osseous skull is intact.  IMPRESSION:  1.  Stable encephalomalacia of the right frontal and temporal lobes. 2.  No acute intracranial abnormality or significant interval change. 3.  Moderate diffuse atrophy.  Original Report Authenticated By: Jamesetta Orleans. MATTERN, M.D.    Medications:    .  sodium chloride   Intravenous Once  . amantadine  100 mg Oral BID  . ARIPiprazole  30 mg Oral QHS  . aspirin  300 mg Rectal Daily   Or  . aspirin   325 mg Oral Daily  . atenolol  25 mg Oral BID  . cefTRIAXone (ROCEPHIN)  IV  1 g Intravenous Q24H  . mulitivitamin with minerals  1 tablet Oral Daily  . oxybutynin  20 mg Oral QHS  . Tamsulosin HCl  0.4 mg Oral QPC supper  . venlafaxine  150 mg Oral Daily  . verapamil  240 mg Oral q morning - 10a  . Vitamin D (Ergocalciferol)  50,000 Units Oral Q Fri  . DISCONTD: cefTRIAXone (ROCEPHIN)  IV  1 g Intravenous Q24H    acetaminophen, acetaminophen, hydrALAZINE, ondansetron (ZOFRAN) IV, senna-docusate     . sodium chloride 50 mL/hr at 10/30/11 2357    Assessment/Plan:  . Reported Facial droop :?TIA  Currently with normal neurologic exam.patient  had a history of CVA in the past with residual a slight slurring of speech as per discharge summary in  Jan 2011 . Patient was not on any antiplatelet  agents as an outpatient. MRI brain showed no acute stroke. Carotid doppler showed no evidence of ICA stenosis bilaterally. 2-D echocardiogram pending. Continue aspirin 325 mg daily and consider neurology consul. Marland KitchenHypertension  -Uncontrolled  -Continue atenolol and verapamil .Use as needed hydralazine and adjust medication when necessary .Incontinence of both urine and stool : Apparently a chronic issue. Patient with  normal neurological exam. -Currently has a Foley catheter in place-a mild traumatic hematuria, he will need voiding trial prior to discharge. Urology followup. UTI  -Continue Rocephin and monitor urine cultures  .Bipolar 1 disorder  -Continue meds .CKD (chronic kidney disease) stage III  -Renal function at baseline, gently hydrate  .Weakness generalized  -? Secondary due to UTI  -PT/OT   Hyperlipidemia: Start statins. DVT Prophylaxis:   SCDs  Code Status:  Full code   LOS: 1 day   Armonie Staten 10/31/2011, 8:48 AM

## 2011-10-31 NOTE — Evaluation (Signed)
Agree with student PT evaluation.  Danicia Terhaar, PT DPT [phone]  

## 2011-10-31 NOTE — Evaluation (Signed)
Physical Therapy Evaluation Patient Details Name: Dominic Pineda MRN: [id number] DOB: 11-14-1950 Today's Date: 10/31/2011  Problem List:  Patient Active Problem List  Diagnoses  . ARF (acute renal failure)  . Diarrhea  . Nausea & vomiting  . Hyperkalemia  . Polyuria  . Hypertensive emergency  . Bipolar 1 disorder  . Shuffling gait  . Weakness generalized  . H/O cocaine abuse  . Hypertension  . Incontinence  . Obstructive uropathy  . Facial droop  . CKD (chronic kidney disease)  . UTI (lower urinary tract infection)    Past Medical History:  Past Medical History  Diagnosis Date  . Hypertension   . Bipolar 1 disorder   . Incontinence   . Mental disorder   . Depression   . Anxiety   . Blood transfusion 1970's  . Shortness of breath     has had a recent increase in episodes of shortness of breath and hyperventilating  . Stroke 2009-2010    "have had 3 strokes"; denies residual  . BPH (benign prostatic hyperplasia)   . H/O acute renal failure 08/2011    severe w/hematuria   Past Surgical History:  Past Surgical History  Procedure Date  . Incision and drainage of wound     right knee "got piece of wire in it while mowing"  . Inguinal hernia repair     left  . Tonsillectomy     "as a child"    PT Assessment/Plan/Recommendation PT Assessment Clinical Impression Statement: Pt is a 61 y.o. male admitted for facial droop and slurred speech, possibly due to UTI.  Patient has resulting decreased mobility and impaired balance. PT Recommendation/Assessment: Patient will need skilled PT in the acute care venue PT Problem List: Decreased activity tolerance;Decreased balance;Decreased mobility Barriers to Discharge: Decreased caregiver support PT Therapy Diagnosis : Difficulty walking;Abnormality of gait PT Plan PT Frequency: Min 3X/week PT Treatment/Interventions: DME instruction;Gait training;Stair training;Functional mobility training;Therapeutic activities;Therapeutic  exercise;Balance training;Neuromuscular re-education;Patient/family education PT Recommendation Follow Up Recommendations: No PT follow up Equipment Recommended: None recommended by PT PT Goals  Acute Rehab PT Goals PT Goal Formulation: With patient Time For Goal Achievement: 7 days Pt will go Supine/Side to Sit: Independently PT Goal: Supine/Side to Sit - Progress: Goal set today Pt will go Sit to Supine/Side: Independently PT Goal: Sit to Supine/Side - Progress: Goal set today Pt will go Sit to Stand: with modified independence PT Goal: Sit to Stand - Progress: Goal set today Pt will go Stand to Sit: with modified independence PT Goal: Stand to Sit - Progress: Goal set today Pt will Ambulate: >150 feet;with modified independence PT Goal: Ambulate - Progress: Goal set today Pt will Go Up / Down Stairs: 3-5 stairs;with supervision PT Goal: Up/Down Stairs - Progress: Goal set today  PT Evaluation Precautions/Restrictions  Precautions Precautions: Fall Required Braces or Orthoses: No Restrictions Weight Bearing Restrictions: No Prior Functioning  Home Living Lives With: Family (mother, niece, nephew, sister) Type of Home: House Home Layout: One level Home Access: Stairs to enter Entrance Stairs-Rails: Right Entrance Stairs-Number of Steps: 3 Bathroom Shower/Tub: Engineer, manufacturing systems: Standard Bathroom Accessibility: Yes How Accessible: Accessible via walker Home Adaptive Equipment: None Prior Function Level of Independence: Independent with basic ADLs;Independent with gait;Independent with transfers Able to Take Stairs?: Yes Driving: No (hasn't driven in a month) Vocation: Retired (and on disability) Cognition Cognition Arousal/Alertness: Awake/alert Overall Cognitive Status: Appears within functional limits for tasks assessed Orientation Level: Oriented to person;Oriented to place;Oriented to time;Oriented to  situation Sensation/Coordination Sensation Light Touch: Appears Intact Coordination Gross Motor Movements are Fluid and Coordinated: Yes Extremity Assessment RLE Assessment RLE Assessment: Within Functional Limits LLE Assessment LLE Assessment: Within Functional Limits Mobility (including Balance) Bed Mobility Bed Mobility: Yes Supine to Sit: 4: Min assist;With rails;HOB flat Supine to Sit Details (indicate cue type and reason): Pt required min assist to initiate transition from supine to sit. Sitting - Scoot to Edge of Bed: 6: Modified independent (Device/Increase time);With rail Sit to Supine: 6: Modified independent (Device/Increase time) Scooting to [facility]: 6: Modified independent (Device/Increase time) Transfers Transfers: Yes Sit to Stand: 5: Supervision Sit to Stand Details (indicate cue type and reason): Pt required supervision for safety. Stand to Sit: 4: Min assist Stand to Sit Details: Patient required min assist to control descent into chair with VC for UE assist. Ambulation/Gait Ambulation/Gait: Yes Ambulation/Gait Assistance: Other (comment) (Min guard) Ambulation/Gait Assistance Details (indicate cue type and reason): Pt required min guard for supervision. Ambulation Distance (Feet): 250 Feet Assistive device: None Gait Pattern: Step-through pattern Stairs: Yes Stairs Assistance: Other (comment) (Min guard) Stairs Assistance Details (indicate cue type and reason): Pt requires min guard for safety. Stair Management Technique: One rail Right;Alternating pattern;Forwards Number of Stairs: 3  Wheelchair Mobility Wheelchair Mobility: No  Posture/Postural Control Posture/Postural Control: No significant limitations Balance Balance Assessed: No Exercise    End of Session General Behavior During Session: [facility] for tasks performed Cognitive Impairment: Slightly slow to process and decreased safety awareness   Ezzard Standing SPT 10/31/2011, 3:09 PM

## 2011-10-31 NOTE — Progress Notes (Signed)
*  PRELIMINARY RESULTS* Echocardiogram 2D Echocardiogram has been performed.  Glean Salen [facility] 10/31/2011, 1:57 PM

## 2011-10-31 NOTE — Evaluation (Addendum)
Speech Language Pathology Evaluation Patient Details Name: Dominic Pineda MRN: [id number] DOB: Apr 06, 1951 Today's Date: 10/31/2011 [id number] Problem List:  Patient Active Problem List  Diagnoses  . ARF (acute renal failure)  . Diarrhea  . Nausea & vomiting  . Hyperkalemia  . Polyuria  . Hypertensive emergency  . Bipolar 1 disorder  . Shuffling gait  . Weakness generalized  . H/O cocaine abuse  . Hypertension  . Incontinence  . Obstructive uropathy  . Facial droop  . CKD (chronic kidney disease)  . UTI (lower urinary tract infection)   Past Medical History:  Past Medical History  Diagnosis Date  . Hypertension   . Bipolar 1 disorder   . Incontinence   . Mental disorder   . Depression   . Anxiety   . Blood transfusion 1970's  . Shortness of breath     has had a recent increase in episodes of shortness of breath and hyperventilating  . Stroke 2009-2010    "have had 3 strokes"; denies residual  . BPH (benign prostatic hyperplasia)   . H/O acute renal failure 08/2011    severe w/hematuria   Past Surgical History:  Past Surgical History  Procedure Date  . Incision and drainage of wound     right knee "got piece of wire in it while mowing"  . Inguinal hernia repair     left  . Tonsillectomy     "as a child"    SLP Assessment/Plan/Recommendation Assessment Clinical Impression Statement: Pt presents with mild dysarthria impairing his speech intelligibility at conversation level.  Pt reports this speech change to be new with this admission, along with left labial drooling (facial nerve involvement), demonstrating good awareness.  Pt was oriented and appeared with functional cogling skills for his environment, however further testing to be conducted.  Note results of MRI, negative for acute CVA, SLP questions if pt's UTI contributing to dysarthria.  SLP to provide pt with compensatory strategies for his dysarthria during functional communication/tx and will further  evaluate cogling abilities.  Thanks for this consult.   Plan Potential to Achieve Goals: Fair Potential Considerations: Ability to learn/carryover information SLP Recommendations Follow up Recommendations: Other (comment) (TBD)  SLP Goals  SLP Goals Potential to Achieve Goals: Fair Potential Considerations: Ability to learn/carryover information Progress/Goals/Alternative treatment plan discussed with pt/caregiver and they: Patient unable to parrticipate in goal setting (pt taken to MRI at end of eval, did not confirm goals ) SLP Goal #1: Pt will utilize compensatory strategies such as slowing rate of speech and overarticulating to increase intelligibility to 90% at conversation level with modified independence assist.    SLP Evaluation Prior Functioning Type of Home: House Lives With: Other (Comment) (mother, niece, nephew, sister) Vocation: Retired (on disability per notes from 09/26/11)  Cognition Arousal/Alertness: Awake/alert Orientation Level: Oriented to person;Oriented to place;Oriented to time;Oriented to situation (calendar updated by SLP, pt then oriented to date) Attention: Selective (pt tuned out the television for evaluation) Selective Attention: Appears intact Memory: Appears intact (pt recalled events prior to admission:  left labial drooling) Awareness: Appears intact (pt expressed change in speech - stated it is now slower) Problem Solving: Appears intact (pt concerned he was having a stroke prior to admit) Behaviors: Other (comment) (? flat affect )  Comprehension Auditory Comprehension Overall Auditory Comprehension: Appears within functional limits for tasks assessed Commands: Within Functional Limits Conversation: Complex (conversation re: health hx and family) Visual Recognition/Discrimination Discrimination: Within Function Limits Reading Comprehension Reading Status:  (pt read calendar)  Expression Expression Primary Mode of Expression:  Verbal Verbal Expression Overall Verbal Expression: Appears within functional limits for tasks assessed Initiation: No impairment Level of Generative/Spontaneous Verbalization: Conversation Naming: Not tested Pragmatics: Impairment Impairments: Abnormal affect;Dysprosody;Monotone (? baseline given h/o CVAs) Other Verbal Expression Comments: Pt able to have a conversation with intact semantics, syntax.  Appropriate turn taking noted but ? flat affect and ? baseline Written Expression Written Expression: Not tested  Oral/Motor Oral Motor/Sensory Function Labial ROM: Within Functional Limits Labial Strength: Reduced Lingual Strength: Reduced Motor Speech Overall Motor Speech: Impaired Phonation: Low vocal intensity Articulation: Impaired Level of Impairment: Sentence Intelligibility: Intelligibility reduced Conversation: 75-100% accurate Motor Planning: Witnin functional limits Effective Techniques: Slow rate (pt slows rate independently)  Donavan Burnet, MS [facility] SLP [phone]

## 2011-11-01 LAB — GLUCOSE, CAPILLARY
Glucose-Capillary: 117 mg/dL — ABNORMAL HIGH (ref 70–99)
Glucose-Capillary: 81 mg/dL (ref 70–99)
Glucose-Capillary: 109 mg/dL — ABNORMAL HIGH (ref 70–99)

## 2011-11-01 LAB — URINE CULTURE: Colony Count: >=100000

## 2011-11-01 MED ORDER — AMOXICILLIN 500 MG PO CAPS
500.0000 mg | ORAL_CAPSULE | Freq: Three times a day (TID) | ORAL | Status: DC
  Administered 2011-11-01 – 2011-11-02 (×3): 500 mg via ORAL
  Filled 2011-11-01 (×6): qty 1

## 2011-11-01 MED ORDER — LEVOFLOXACIN 750 MG PO TABS
750.0000 mg | ORAL_TABLET | Freq: Every day | ORAL | Status: DC
  Administered 2011-11-01: 750 mg via ORAL
  Filled 2011-11-01: qty 1

## 2011-11-01 NOTE — Progress Notes (Signed)
Physical Therapy Treatment Patient Details Name: Dominic Pineda MRN: [id number] DOB: [birth year] Today's Date: 11/01/2011  PT Assessment/Plan  PT - Assessment/Plan Comments on Treatment Session: Pt making good progress, supervision only for safety. Pt still impulsive requiring increased cueing for safety throughout treatment. PT Plan: Discharge plan remains appropriate PT Frequency: Min 3X/week Follow Up Recommendations: No PT follow up Equipment Recommended: None recommended by OT PT Goals  Acute Rehab PT Goals PT Goal Formulation: With patient PT Goal: Sit to Stand - Progress: Met PT Goal: Stand to Sit - Progress: Met PT Goal: Ambulate - Progress: Progressing toward goal PT Goal: Up/Down Stairs - Progress: Met  PT Treatment Precautions/Restrictions  Precautions Precautions: Fall Required Braces or Orthoses: No Restrictions Weight Bearing Restrictions: No Mobility (including Balance) Bed Mobility Bed Mobility: No (Pt up in chair) Transfers Transfers: Yes Sit to Stand: 6: Modified independent (Device/Increase time);From chair/3-in-1;With armrests;With upper extremity assist Stand to Sit: 6: Modified independent (Device/Increase time);To chair/3-in-1;With armrests;With upper extremity assist Ambulation/Gait Ambulation/Gait: Yes Ambulation/Gait Assistance: 5: Supervision Ambulation/Gait Assistance Details (indicate cue type and reason): Supervision for safety secondry to pt with increased lateral sway. Ambulation Distance (Feet): 150 Feet Assistive device: None Gait Pattern: Step-through pattern Gait velocity: decreased gait speed Stairs: Yes Stairs Assistance: 5: Supervision Stairs Assistance Details (indicate cue type and reason): VC for proper sequencing  Stair Management Technique: One rail Right;Alternating pattern;Forwards;Step to pattern Number of Stairs: 5     Exercise    End of Session PT - End of Session Equipment Utilized During Treatment: Gait  belt Activity Tolerance: Patient tolerated treatment well Patient left: in chair;with call bell in reach Nurse Communication: Mobility status for transfers;Mobility status for ambulation General Behavior During Session: [facility] for tasks performed Cognition: Impaired Cognitive Impairment: Continues to demonstrate impulsive behavior. Requested that pt remain seated. After agreeing to remain seated, pt stood up.  Slow to process commands/questions.  Milana Kidney 11/01/2011, 5:52 PM  11/01/2011 Milana Kidney DPT PAGER: [phone] OFFICE: [phone]

## 2011-11-01 NOTE — Progress Notes (Addendum)
Subjective: Patient seen and examined, denies any complaints.  Objective: Vital signs in last 24 hours: Temp:  [97.7 F (36.5 C)-98.5 F (36.9 C)] 97.7 F (36.5 C) (04/04 1000) Pulse Rate:  [55-75] 57  (04/04 1000) Resp:  [18-20] 18  (04/04 1000) BP: (135-198)/(76-91) 135/77 mmHg (04/04 1000) SpO2:  [91 %-96 %] 96 % (04/04 1000) Weight change:  Last BM Date: 10/31/11  Intake/Output from previous day: 04/03 0701 - 04/04 0700 In: 1040 [P.O.:960; IV Piggyback:50] Out: 1177 [Urine:1175; Stool:2] Total I/O In: 480 [P.O.:480] Out: 800 [Urine:800]   Physical Exam: General: Alert, awake, in no acute distress. Slow speech but no slurring or dysarthria noted.  Heart: Regular rate and rhythm, without murmurs, rubs, gallops.  Lungs: Clear to auscultation bilaterally.  Abdomen: Soft, nontender, nondistended, positive bowel sounds. Foley in place, was light red urine Extremities: No clubbing cyanosis or edema with positive pedal pulses.  Neuro: Grossly intact, nonfocal.     Lab Results: Results for orders placed during the hospital encounter of 10/30/11 (from the past 24 hour(s))  GLUCOSE, CAPILLARY     Status: Abnormal   Collection Time   10/31/11  4:26 PM      Component Value Range   Glucose-Capillary 136 (*) 70 - 99 (mg/dL)  GLUCOSE, CAPILLARY     Status: Abnormal   Collection Time   10/31/11 10:22 PM      Component Value Range   Glucose-Capillary 117 (*) 70 - 99 (mg/dL)   Comment 1 Documented in Chart     Comment 2 Notify RN    GLUCOSE, CAPILLARY     Status: Normal   Collection Time   11/01/11  7:09 AM      Component Value Range   Glucose-Capillary 97  70 - 99 (mg/dL)   Comment 1 Documented in Chart     Comment 2 Notify RN    GLUCOSE, CAPILLARY     Status: Normal   Collection Time   11/01/11 11:03 AM      Component Value Range   Glucose-Capillary 88  70 - 99 (mg/dL)   Comment 1 Notify RN     Comment 2 Documented in Chart      Studies/Results:  Medications:    .  sodium chloride   Intravenous STAT  . amantadine  100 mg Oral BID  . amoxicillin  500 mg Oral Q8H  . ARIPiprazole  30 mg Oral QHS  . aspirin  300 mg Rectal Daily   Or  . aspirin  325 mg Oral Daily  . atenolol  25 mg Oral BID  . mulitivitamin with minerals  1 tablet Oral Daily  . oxybutynin  20 mg Oral QHS  . simvastatin  10 mg Oral QHS  . Tamsulosin HCl  0.4 mg Oral QPC supper  . venlafaxine  150 mg Oral Daily  . verapamil  240 mg Oral q morning - 10a  . Vitamin D (Ergocalciferol)  50,000 Units Oral Q Fri  . DISCONTD: cefTRIAXone (ROCEPHIN)  IV  1 g Intravenous Q24H  . DISCONTD: levofloxacin  750 mg Oral Daily    acetaminophen, acetaminophen, hydrALAZINE, ondansetron (ZOFRAN) IV, senna-docusate     . sodium chloride 50 mL/hr at 10/30/11 2357    Assessment/Plan: . Reported Facial droop :?TIA  Currently with normal neurologic exam.patient had a history of CVA in the past with residual a slight slurring of speech as per discharge summary in Jan 2011 . Patient was not on any antiplatelet agents as an outpatient. I  spoke to his PCP Dr.Anyim at [phone] ext [phone] who stated that patient was on aspirin a year ago however he was hospitalized to [facility] in January of this year for psychiatric illness and he was not on aspirin at that time  MRI brain showed no acute stroke. Carotid doppler showed no evidence of ICA stenosis bilaterally. 2-D echocardiogram showed EF of 55-60% and trivial pericardial effusion.  Case discussed with Dr. Roseanne Reno from neuro hospitalist service who recommended to Continue aspirin 325 mg daily. .Hypertension  -Improved -Continue atenolol and verapamil .Use as needed hydralazine and adjust medication when necessary .Incontinence of both urine and stool : Apparently a chronic issue. Patient with normal neurological exam.  -Currently has a Foley catheter in place-a mild traumatic hematuria after reports of pulling foley while in the ED, he will need voiding trial  prior to discharge. Urology followup as outpatient.  UTI : Urine culture going to check a species, will switch to IV antibiotics to amoxicillin .Bipolar 1 disorder  -Continue meds .CKD (chronic kidney disease) stage III  -Renal function at baseline, gently hydrate  .Weakness generalized  -? Secondary due to UTI  -PT/OT recommended home with home health services(PT/RN/SW/SPEECH)/ 24-hour supervision versus SNF Hyperlipidemia: Start statins.  DVT Prophylaxis:  SCDs  Code Status:  Full code      LOS: 2 days   Vela Render 11/01/2011, 2:41 PM

## 2011-11-01 NOTE — Progress Notes (Signed)
Clinical Social Work Department BRIEF PSYCHOSOCIAL ASSESSMENT 11/01/2011  Patient:  Dominic Pineda, Dominic Pineda     Account Number:  [id number]     Admit date:  10/30/2011  Clinical Social Worker:  Dennison Bulla  Date/Time:  11/01/2011 02:15 PM  Referred by:  Physician  Date Referred:  11/01/2011 Referred for  ALF Placement   Other Referral:   Interview type:  Patient Other interview type:   Spoke with dtr also (Mrs. Dan Humphreys)    PSYCHOSOCIAL DATA Living Status:  FAMILY Admitted from facility:   Level of care:   Primary support name:  Alvira Philips Primary support relationship to patient:  PARENT Degree of support available:   Adequate    CURRENT CONCERNS Current Concerns  Post-Acute Placement   Other Concerns:    SOCIAL WORK ASSESSMENT / PLAN CSW received referral to assist with dc planning. CSW met with patient at bedside. Patient reported he stayed 20 days at [facility] and was released a couple of weeks ago. Patient reports he lives with his mom, sister, niece and nephew. Patient reports that [facility] is involved with him and he has PT/OT/RN/CSW. CSW called [facility] and left a message with CSW [phone]) regarding patient's care. Patient reports that CSW has been assisting him with ALF placement. Patient reports he is still not sure if he will go to ALF or not but feels comfortable at home. CSW received permission to contact family. CSW spoke with sister who reported that patient lives at home but feels ALF would be more appropriate in the future. CSW provided patient and dtr with ALF list. CSW discussed patient applying for Medicaid to assist with ALF placement. CSW is signing off but available if needed.   Assessment/plan status:  No Further Intervention Required Other assessment/ plan:   Information/referral to community resources:   ALF list and Medicaid information    PATIENT'S/FAMILY'S RESPONSE TO PLAN OF CARE: Patient was alert and oriented. Patient reports that he  feels comfortable returning home and family can assist if needed. Patient reports that he is still considering ALF but is worried about losing his disability check if he goes to ALF. Patient has used 20 days of Medicare (covered by 100%) at SNF and therefore would responsible for 20% if he returned to SNF so patient prefers to return home.

## 2011-11-01 NOTE — Progress Notes (Signed)
Occupational Therapy Treatment/ Discharge Note Patient Details Name: Dominic Pineda MRN: [id number] DOB: [birth year] Today's Date: 11/01/2011  OT Assessment/Plan OT Assessment/Plan Comments on Treatment Session: Pt able to demonstrate functional tranfers with supervision for safety due to occasional impulsive behavior.  Feel that pt is at baseline level.   OT Plan: Discharge plan remains appropriate OT Frequency: Min 2X/week Follow Up Recommendations: Supervision/Assistance - 24 hour Equipment Recommended: None recommended by OT OT Goals ADL Goals Pt Will Perform Grooming: with modified independence;Standing at sink ADL Goal: Grooming - Progress: Met Pt Will Transfer to Toilet: with supervision;Regular height toilet ADL Goal: Toilet Transfer - Progress: Met Pt Will Perform Tub/Shower Transfer: Tub transfer;Ambulation;with supervision ADL Goal: Tub/Shower Transfer - Progress: Met  OT Treatment Precautions/Restrictions  Precautions Precautions: Fall Required Braces or Orthoses: No Restrictions Weight Bearing Restrictions: No   ADL ADL Grooming: Modified independent;Simulated Where Assessed - Grooming: Standing at sink Toilet Transfer: Simulated;Modified independent Toilet Transfer Method: Stand pivot Acupuncturist: Other (comment) (chair) Tub/Shower Transfer: Engineer, manufacturing Details (indicate cue type and reason): Tub transfer with close supervision for safety. Tub/Shower Transfer Method: Ambulating Ambulation Related to ADLs: Pt ambulated to therapy gym with supervision for safety due to impulsiveness. Mobility  Bed Mobility Bed Mobility: No (Pt up in chair) Transfers Sit to Stand: 6: Modified independent (Device/Increase time);From chair/3-in-1;With armrests;With upper extremity assist Stand to Sit: 6: Modified independent (Device/Increase time);To chair/3-in-1;With armrests;With upper extremity assist Exercises    End of  Session OT - End of Session Equipment Utilized During Treatment: Gait belt Activity Tolerance: Patient tolerated treatment well Patient left: in chair;with call bell in reach Nurse Communication: Mobility status for transfers General Behavior During Session: [facility] for tasks performed Cognition: Impaired Cognitive Impairment: Continues to demonstrate impulsive behavior. Requested that pt remain seated. After agreeing to remain seated, pt stood up.  Slow to process commands/questions.   5:15 PM 11/01/2011 Cipriano Mile OTR/L Pager [phone] Office [phone]

## 2011-11-02 LAB — BASIC METABOLIC PANEL
BUN: 23 mg/dL (ref 6–23)
Creatinine, Ser: 1.5 mg/dL — ABNORMAL HIGH (ref 0.50–1.35)
GFR calc Af Amer: 56 mL/min — ABNORMAL LOW (ref >90)
GFR calc non Af Amer: 48 mL/min — ABNORMAL LOW (ref >90)
Potassium: 4.1 mEq/L (ref 3.5–5.1)

## 2011-11-02 LAB — CBC
HCT: 39.1 % (ref 39.0–52.0)
MCHC: 33.8 g/dL (ref 30.0–36.0)
Platelets: 216 10*3/uL (ref 150–400)
RDW: 14.3 % (ref 11.5–15.5)
WBC: 7.8 10*3/uL (ref 4.0–10.5)

## 2011-11-02 MED ORDER — SIMVASTATIN 10 MG PO TABS: 10.0000 mg | ORAL_TABLET | Freq: Every day | ORAL | Status: DC

## 2011-11-02 MED ORDER — HYDRALAZINE HCL 25 MG PO TABS: 25.0000 mg | ORAL_TABLET | Freq: Two times a day (BID) | ORAL | Status: DC

## 2011-11-02 MED ORDER — ASPIRIN 325 MG PO TABS: 325.0000 mg | ORAL_TABLET | Freq: Every day | ORAL | Status: AC

## 2011-11-02 MED ORDER — AMOXICILLIN 500 MG PO CAPS: 500.0000 mg | ORAL_CAPSULE | Freq: Three times a day (TID) | ORAL | Status: AC

## 2011-11-02 MED ORDER — ATENOLOL 25 MG PO TABS: 25.0000 mg | ORAL_TABLET | Freq: Every day | ORAL | Status: DC

## 2011-11-02 NOTE — Discharge Summary (Addendum)
Patient ID: Dominic Pineda MRN: [id number] DOB/AGE: 1951-02-04 61 y.o.  Admit date: 10/30/2011 Discharge date: 11/02/2011  Primary Care Physician:  Provider Not In System   Discharge Diagnoses:    Present on Admission:  .Facial droop .Hypertension .Incontinence .Bipolar 1 disorder .CKD (chronic kidney disease) .Weakness generalized .UTI (lower urinary tract infection) Sinus bradycardia  Medication List  As of 11/02/2011  1:46 PM   TAKE these medications         acetaminophen 325 MG tablet   Commonly known as: TYLENOL   Take 650 mg by mouth 2 (two) times daily as needed. For pain.      amantadine 100 MG capsule   Commonly known as: SYMMETREL   Take 100 mg by mouth 2 (two) times daily. To prevent side effects of Abilify      amoxicillin 500 MG capsule   Commonly known as: AMOXIL   Take 1 capsule (500 mg total) by mouth every 8 (eight) hours.      ARIPiprazole 30 MG tablet   Commonly known as: ABILIFY   Take 30 mg by mouth at bedtime.      aspirin 325 MG tablet   Take 1 tablet (325 mg total) by mouth daily.      atenolol 25 MG tablet   Commonly known as: TENORMIN   Take 1 tablet (25 mg total) by mouth daily.      hydrALAZINE 25 MG tablet   Commonly known as: APRESOLINE   Take 1 tablet (25 mg total) by mouth 2 (two) times daily.      multivitamin with minerals tablet   Take 1 tablet by mouth daily.      oxybutynin 10 MG 24 hr tablet   Commonly known as: DITROPAN-XL   Take 20 mg by mouth at bedtime.      simvastatin 10 MG tablet   Commonly known as: ZOCOR   Take 1 tablet (10 mg total) by mouth at bedtime.      Tamsulosin HCl 0.4 MG Caps   Commonly known as: FLOMAX   Take 1 capsule (0.4 mg total) by mouth daily after supper.      venlafaxine 150 MG 24 hr capsule   Commonly known as: EFFEXOR-XR   Take 150 mg by mouth daily.      verapamil 240 MG CR tablet   Commonly known as: CALAN-SR   Take 240 mg by mouth every morning.      Vitamin D (Ergocalciferol)  50000 UNITS Caps   Commonly known as: DRISDOL   Take 50,000 Units by mouth every 7 (seven) days. On Friday             Consults: Telephone consultation with Dr. Roseanne Reno (Neurohospitalist)   Significant Diagnostic Studies:  Dg Chest 2 View  10/30/2011  *RADIOLOGY REPORT*  Clinical Data: Altered level of consciousness.  Pain.  CHEST - 2 VIEW  Comparison: Two-view chest 08/22/2009.  Findings: Mild cardiomegaly is stable.  The right hemidiaphragm remains elevated.  Bibasilar atelectasis is noted.  No other focal airspace disease is evident.  The visualized soft tissues and bony thorax are unremarkable.  IMPRESSION:  1.  Stable borderline cardiomegaly without failure. 2.  Stable elevation of the right hemidiaphragm with right basilar atelectasis.  Original Report Authenticated By: Jamesetta Orleans. MATTERN, M.D.   Ct Head Wo Contrast  10/30/2011  *RADIOLOGY REPORT*  Clinical Data: Altered mental status for 3 days.  Diffuse weakness.  CT HEAD WITHOUT CONTRAST  Technique:  Contiguous axial images were  obtained from the base of the skull through the vertex without contrast.  Comparison: MRI brain without contrast 09/23/2011.  Findings: Encephalomalacia of the right frontal and temporal lobe is stable.  There is ex vacuo dilation.  No new cortical infarct, hemorrhage, or mass lesion is present.  The ventricles are stable in size and proportionate to the degree of atrophy.  No significant extra-axial fluid collection is present.  The paranasal sinuses and mastoid air cells are clear. Atherosclerotic calcifications are present within the cavernous carotid arteries bilaterally.  The osseous skull is intact.  IMPRESSION:  1.  Stable encephalomalacia of the right frontal and temporal lobes. 2.  No acute intracranial abnormality or significant interval change. 3.  Moderate diffuse atrophy.  Original Report Authenticated By: Jamesetta Orleans. MATTERN, M.D.   Mr Brain Wo Contrast  10/31/2011  *RADIOLOGY REPORT*  Clinical  Data: Altered mental status for 3 days.  Weakness.  MRI HEAD WITHOUT CONTRAST  Technique:  Multiplanar, multiecho pulse sequences of the brain and surrounding structures were obtained according to standard protocol without intravenous contrast.  Comparison: CT head 10/30/2011.  MR head 09/23/2011.  Findings: There is no evidence for acute infarction, intracranial hemorrhage, mass lesion, hydrocephalus, or extra-axial fluid. Moderate atrophy is present.  Chronic microvascular ischemic changes noted in the periventricular and subcortical white matter.  There is a large remote right MCA territory infarct affecting the frontotemporal region also involving the basal ganglia.  No significant chronic hemorrhage is present.  Right ventricular enlargement is ex vacuo in nature.  The major intracranial vascular structures are widely patent.  There is no evidence for right carotid or MCA thrombosis.  Redemonstrated is a 4 mm structure right paramedian adenohypophyseal  lesion having prolonged T1 and T2 signal intensity unchanged from priors.  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  Stable appearance from prior examinations, extending as far back as 2008 and 2011.  Pituitary and cerebellar tonsils are unremarkable.  No acute orbit, sinus, or mastoid pathology.  Compared with prior studies, there is little change.  IMPRESSION: Moderately advanced atrophy and small vessel disease with the patient's age of 61.  No acute intracranial findings.  Large remote right MCA territory infarct.  Original Report Authenticated By: Elsie Stain, M.D.    Brief H and P: For complete details please refer to admission H and P, but in brief  Patient is a 61 year old African American male with a past medical history of CVA claims to have no residual deficits from this, hypertension, bipolar disorder, BPH who presents to the hospital with the above-noted complaints. This patient was  discharged from this hospital last month and was sent to a skilled nursing facility for rehabilitation, he claims he was discharged from there around 2 weeks ago. He claims for the past 3 days he has had left facial droop and drooling from the face. He claims that he feels weak all over, however does not have any focal weakness. He denies any headache or back pain. He claims that he now has incontinence of both bowel and urine. Apparently this has happened in the past as well. Review of his medication list sure that the patient is on oxybutynin. During my evaluation, I did not notice any left facial droop, speech is slow with very minimal slurring not sure whether this is his baseline. Patient denies any fever, denies any dysuria, he denies any diarrhea. He does not have any abdominal pain or chest pain or shortness  of breath. He is now being admitted to the hospitalist service for further evaluation and treatment      Hospital Course:  Patient was admitted to neurology/telemetry floor, his facial droop had resolved by the time he came to the ER, stroke workup was unremarkable. Patient had prior stroke however on review of his record he was not on antiplatelet agents as an outpatient. Patient was kept on aspirin throughout his hospitalization, case was discussed with Dr. Roseanne Reno and he recommended to discharge him home on aspirin. . Reported Facial droop :?TIA  Currently with normal neurologic exam.patient had a history of CVA in the past with residual a slight slurring of speech as per discharge summary in Jan 2011 . Patient was not on any antiplatelet agents as an outpatient. I spoke to his PCP Dr.Anyim at [phone] ext [phone] who stated that patient was on aspirin a year ago however he was hospitalized to [facility] in January of this year for psychiatric illness and he was not on aspirin at that time  MRI brain showed no acute stroke. Carotid doppler showed no evidence of ICA stenosis bilaterally. 2-D  echocardiogram showed EF of 55-60% and trivial pericardial effusion.  Case discussed with Dr. Roseanne Reno from neuro hospitalist service who recommended to Continue aspirin 325 mg daily. .Hypertension  -Patient was kept on atenolol , verapamil and as needed hydralazine and adjust medication when necessary his blood pressure had improved however was not optimal, his heart rate running between 55-65,  I decreased atenolol dose to 25 mg daily, would keep him on verapamil and add hydralazine by mouth. Patient to follow with his PCP for further adjustment.  .Incontinence of both urine and stool : Apparently a chronic issue. Patient with normal neurological exam. Foley was placed in the ED and apparently patient told Foley and had mild hematuria which had resolved, Foley was discontinued yesterday and patient passed trial of voiding. He was followed previously by urology as an outpatient and will need to followup again for further management. UTI : Urine culture grew Enterococcus , patient was initiated on IV Rocephin and that was switched to by mouth amoxicillin for 5 days.  .Bipolar 1 disorder  -Continue meds, remained stable.  .CKD (chronic kidney disease) stage III  -Renal function at baseline, he was gently hydrated  .Weakness generalized  -PT/OT recommended home with home health services(PT/RN/SW/SPEECH)/ 24-hour supervision , social worker discuss his family. Hyperlipidemia: Started on  statins. Will  need repeat LFTs as an outpatient, follow his PCP   Subjective   Patient seen and examined, denies any complaints.   Filed Vitals:   11/02/11 0620  BP: 145/80  Pulse: 55  Temp: 97.4 F (36.3 C)  Resp: 19    General: Alert, awake, in no acute distress. Slow speech but no slurring or dysarthria noted.  Heart: Regular rate and rhythm, without murmurs, rubs, gallops.  Lungs: Clear to auscultation bilaterally.  Abdomen: Soft, nontender, nondistended, positive bowel sounds. Foley in place, was  light red urine  Extremities: No clubbing cyanosis or edema with positive pedal pulses.  Neuro: Grossly intact, nonfocal.    Disposition and Follow-up:  To home with home health services Follow his PCP , he can also follow  with a neurologist at the [place], will defer to PCP.   Time spent on Discharge: Approximately 45 minutes   Signed: Chestine Belknap 11/02/2011, 1:46 PM

## 2011-11-02 NOTE — Progress Notes (Signed)
Physical Therapy Treatment Patient Details Name: Dominic Pineda MRN: [id number] DOB: [birth year] Today's Date: 11/02/2011  PT Assessment/Plan  PT - Assessment/Plan Comments on Treatment Session: Pt was modified independent or independent with all activities.  Pt scored a 22/24 on the DGI.  Pt has met all goals and does not required further PT in the acute care setting, PT will sign off on this patient. PT Plan: Discharge plan remains appropriate PT Frequency: Min 3X/week Follow Up Recommendations: No PT follow up Equipment Recommended: None recommended by PT PT Goals  Acute Rehab PT Goals PT Goal Formulation: With patient Time For Goal Achievement: 7 days Pt will go Sit to Stand: with modified independence PT Goal: Sit to Stand - Progress: Met Pt will go Stand to Sit: with modified independence PT Goal: Stand to Sit - Progress: Met Pt will Ambulate: >150 feet;with modified independence PT Goal: Ambulate - Progress: Met Pt will Go Up / Down Stairs: 3-5 stairs;with supervision PT Goal: Up/Down Stairs - Progress: Met  PT Treatment Precautions/Restrictions  Precautions Precautions: Fall Required Braces or Orthoses: No Restrictions Weight Bearing Restrictions: No Mobility (including Balance) Bed Mobility Bed Mobility: No (Pt in hall when PT session began.) Transfers Transfers: Yes Stand to Sit: 7: Independent Ambulation/Gait Ambulation/Gait: Yes Ambulation/Gait Assistance: 7: Independent Ambulation Distance (Feet): 350 Feet Assistive device: None Gait Pattern: Step-through pattern Gait velocity: decreased gait speed Stairs: Yes Stairs Assistance: 6: Modified independent (Device/Increase time) Stair Management Technique: One rail Right;Alternating pattern;Forwards Number of Stairs: 3   Posture/Postural Control Posture/Postural Control: No significant limitations Balance Balance Assessed: No Dynamic Gait Index Level Surface: Normal Change in Gait Speed: Normal Gait  with Horizontal Head Turns: Normal Gait with Vertical Head Turns: Normal Gait and Pivot Turn: Mild Impairment Step Over Obstacle: Mild Impairment Step Around Obstacles: Normal Steps: Normal Total Score: 22  Exercise    End of Session PT - End of Session Equipment Utilized During Treatment: Gait belt Activity Tolerance: Patient tolerated treatment well Patient left: in chair;with call bell in reach General Behavior During Session: [facility] for tasks performed Cognition: Impaired Cognitive Impairment: Slightly slow to process however appropriate and WFL.  Was up in the hall w/o assistance, however after that did not demonstrate impulsive behavior.  Ezzard Standing SPT 11/02/2011, 12:16 PM

## 2011-11-02 NOTE — Progress Notes (Signed)
Agree with student PT treatment note. Miette Molenda, PT DPT [phone]  

## 2011-11-03 LAB — URINE CULTURE: Colony Count: >=100000

## 2012-11-30 ENCOUNTER — Encounter (HOSPITAL_COMMUNITY): Payer: Self-pay | Admitting: *Deleted

## 2012-11-30 ENCOUNTER — Encounter (HOSPITAL_COMMUNITY): Payer: Self-pay

## 2012-11-30 ENCOUNTER — Emergency Department (HOSPITAL_COMMUNITY)
Admission: EM | Admit: 2012-11-30 | Discharge: 2012-12-01 | Disposition: A | Discharge status: Home / Self Care | Payer: Medicare Other | Source: Home / Self Care | Attending: Emergency Medicine | Admitting: Emergency Medicine

## 2012-11-30 ENCOUNTER — Emergency Department (HOSPITAL_COMMUNITY)
Admission: EM | Admit: 2012-11-30 | Discharge: 2012-11-30 | Disposition: A | Discharge status: Home / Self Care | Payer: Medicare Other | Attending: Emergency Medicine | Admitting: Emergency Medicine

## 2012-11-30 DIAGNOSIS — I10 Essential (primary) hypertension: Secondary | ICD-10-CM | POA: Insufficient documentation

## 2012-11-30 DIAGNOSIS — Z8673 Personal history of transient ischemic attack (TIA), and cerebral infarction without residual deficits: Secondary | ICD-10-CM | POA: Insufficient documentation

## 2012-11-30 DIAGNOSIS — I129 Hypertensive chronic kidney disease with stage 1 through stage 4 chronic kidney disease, or unspecified chronic kidney disease: Secondary | ICD-10-CM | POA: Insufficient documentation

## 2012-11-30 DIAGNOSIS — F319 Bipolar disorder, unspecified: Secondary | ICD-10-CM | POA: Insufficient documentation

## 2012-11-30 DIAGNOSIS — Z87891 Personal history of nicotine dependence: Secondary | ICD-10-CM | POA: Insufficient documentation

## 2012-11-30 DIAGNOSIS — R531 Weakness: Secondary | ICD-10-CM

## 2012-11-30 DIAGNOSIS — Z87448 Personal history of other diseases of urinary system: Secondary | ICD-10-CM | POA: Insufficient documentation

## 2012-11-30 DIAGNOSIS — N400 Enlarged prostate without lower urinary tract symptoms: Secondary | ICD-10-CM | POA: Insufficient documentation

## 2012-11-30 DIAGNOSIS — Z8659 Personal history of other mental and behavioral disorders: Secondary | ICD-10-CM | POA: Insufficient documentation

## 2012-11-30 DIAGNOSIS — F411 Generalized anxiety disorder: Secondary | ICD-10-CM | POA: Insufficient documentation

## 2012-11-30 DIAGNOSIS — F1410 Cocaine abuse, uncomplicated: Secondary | ICD-10-CM | POA: Insufficient documentation

## 2012-11-30 DIAGNOSIS — R51 Headache: Secondary | ICD-10-CM | POA: Insufficient documentation

## 2012-11-30 DIAGNOSIS — R5383 Other fatigue: Secondary | ICD-10-CM | POA: Insufficient documentation

## 2012-11-30 DIAGNOSIS — R5381 Other malaise: Secondary | ICD-10-CM | POA: Insufficient documentation

## 2012-11-30 DIAGNOSIS — F3289 Other specified depressive episodes: Secondary | ICD-10-CM | POA: Insufficient documentation

## 2012-11-30 DIAGNOSIS — Z79899 Other long term (current) drug therapy: Secondary | ICD-10-CM | POA: Insufficient documentation

## 2012-11-30 DIAGNOSIS — F329 Major depressive disorder, single episode, unspecified: Secondary | ICD-10-CM | POA: Insufficient documentation

## 2012-11-30 DIAGNOSIS — J3489 Other specified disorders of nose and nasal sinuses: Secondary | ICD-10-CM | POA: Insufficient documentation

## 2012-11-30 LAB — BASIC METABOLIC PANEL
Calcium: 8.4 mg/dL (ref 8.4–10.5)
GFR calc Af Amer: 51 mL/min — ABNORMAL LOW (ref >90)
GFR calc non Af Amer: 44 mL/min — ABNORMAL LOW (ref >90)
Glucose, Bld: 103 mg/dL — ABNORMAL HIGH (ref 70–99)
Potassium: 4.4 mEq/L (ref 3.5–5.1)
Sodium: 142 mEq/L (ref 135–145)

## 2012-11-30 LAB — ETHANOL: Alcohol, Ethyl (B): <11 mg/dL (ref 0–11)

## 2012-11-30 LAB — CBC
MCH: 29.4 pg (ref 26.0–34.0)
MCHC: 33.8 g/dL (ref 30.0–36.0)
Platelets: 230 10*3/uL (ref 150–400)
RDW: 14 % (ref 11.5–15.5)

## 2012-11-30 NOTE — ED Notes (Signed)
Pt c/o dizziness x 4 hours; pt seen and discharged earlier today for same; has been sitting in waiting room waiting for ride that hasn't come yet; pt states feels weak

## 2012-11-30 NOTE — ED Notes (Signed)
EMS were phoned by [employer], who found pt. To have lied down on the sidewalk in front of their establishment.  Pt. Is in no distress; having been incontinent of urine (he is wearing adult diaper upon arrival) ?chronic incontinence?  He is in no distress, and is oriented x 4 with slightly slurred speech.  He tells me that as he was simply standing in front of [employer] just felt like I couldn't stand up anymore".  He denies any trauma or l.o.c. Or any pain a this time.

## 2012-11-30 NOTE — ED Notes (Signed)
Patient stated that his abdomen hurt but changed his mind upon palpation that it wasn't hurting. States that he wasn't feeling good today so he layed down on the side walk before he fell down. Nonspecific in his complaint about not feeling well.

## 2012-11-30 NOTE — ED Notes (Signed)
Patient is eating his 2nd sandwich along with juice. Tolerating without difficulty

## 2012-11-30 NOTE — ED Notes (Signed)
Pt requesting something to eat; states symptoms now no different than earlier today when he was evaluated; sandwich and juice given; pt ambulated to waiting room without difficulty

## 2012-11-30 NOTE — ED Provider Notes (Signed)
History     CSN: [id number]  Arrival date & time 11/30/12  1541   First MD Initiated Contact with Patient 11/30/12 1552      Chief Complaint  Patient presents with  . Weakness     HPI Patient reports he feels generally weak today.  He's been more tired today.  He was found lying on the sidewalk in front of [employer] today.  The patient denies chest pain shortness of breath.  He reports he always has urinary incontinence.  He denies drug or alcohol use.  He denies weakness of his arms or legs.  He just stated that today he felt more weak.  He has had mild decreased oral intake.  No chest pain shortness breath.  No abdominal pain.  No nausea vomiting or diarrhea.  No melena or hematochezia.  His history of hypertension, bipolar disorder, depression, anxiety, prior stroke.  He denies alcohol use.  He still occasionally uses cocaine.  He states is mainly in the emergency apartment because he is tired and hungry   Past Medical History  Diagnosis Date  . Hypertension   . Bipolar 1 disorder   . Incontinence   . Mental disorder   . Depression   . Anxiety   . Blood transfusion 1970's  . Shortness of breath     has had a recent increase in episodes of shortness of breath and hyperventilating  . Stroke 2009-2010    "have had 3 strokes"; denies residual  . BPH (benign prostatic hyperplasia)   . H/O acute renal failure 08/2011    severe w/hematuria    Past Surgical History  Procedure Laterality Date  . Incision and drainage of wound      right knee "got piece of wire in it while mowing"  . Inguinal hernia repair      left  . Tonsillectomy      "as a child"    Family History  Problem Relation Age of Onset  . Coronary artery disease Father     History  Substance Use Topics  . Smoking status: Former Smoker -- 1.00 packs/day for 18 years    Types: Cigarettes    Quit date: 07/30/1986  . Smokeless tobacco: Never Used  . Alcohol Use: Yes     Comment: "quit drinking ~ 2007"       Review of Systems  All other systems reviewed and are negative.    Allergies  Codeine  Home Medications   Current Outpatient Rx  Name  Route  Sig  Dispense  Refill  . venlafaxine (EFFEXOR-XR) 150 MG 24 hr capsule   Oral   Take 150 mg by mouth daily.         . verapamil (CALAN-SR) 240 MG CR tablet   Oral   Take 240 mg by mouth every morning.           BP 142/69  SpO2 94%  Physical Exam  Nursing note and vitals reviewed. Constitutional: He is oriented to person, place, and time. He appears well-developed and well-nourished.  HENT:  Head: Normocephalic and atraumatic.  Eyes: EOM are normal. Pupils are equal, round, and reactive to light.  Neck: Normal range of motion.  Cardiovascular: Normal rate, regular rhythm, normal heart sounds and intact distal pulses.   Pulmonary/Chest: Effort normal and breath sounds normal. No respiratory distress.  Abdominal: Soft. He exhibits no distension. There is no tenderness.  Genitourinary: Rectum normal.  Musculoskeletal: Normal range of motion.  Neurological: He is alert  and oriented to person, place, and time.  5/5 strength in major muscle groups of  bilateral upper and lower extremities. Speech normal. No facial asymetry.   Skin: Skin is warm and dry.  Psychiatric: He has a normal mood and affect. Judgment normal.    ED Course  Procedures (including critical care time)  Labs Reviewed  BASIC METABOLIC PANEL - Abnormal; Notable for the following:    Glucose, Bld 103 (*)    Creatinine, Ser 1.60 (*)    GFR calc non Af Amer 44 (*)    GFR calc Af Amer 51 (*)    All other components within normal limits  CBC  ETHANOL   No results found.   1. Weakness       MDM  Patient feels better after IV fluids and food as well as resting in the emergency department.  Medical screening examination completed.  No life-threatening emergency.  Discharge home with instructions to followup with her primary care  physician        Lyanne Co, MD 11/30/12 [phone]

## 2012-12-01 NOTE — ED Provider Notes (Signed)
History     CSN: [id number]  Arrival date & time 11/30/12  2255   First MD Initiated Contact with Patient 12/01/12 0135      Chief Complaint  Patient presents with  . Dizziness    (Consider location/radiation/quality/duration/timing/severity/associated sxs/prior treatment) HPI Patient reports "I'm tired" he states that earlier today he had a headache and felt lightheaded and congestion in his nose he was evaluated by Dr. Patria Mane earlier today presents for reevaluation. Headache has resolved he still feels mildly congested in his nose he no longer feels lightheaded. No treatment prior to coming here. Received IV fluids during ED visit earlier today. Past Medical History  Diagnosis Date  . Hypertension   . Bipolar 1 disorder   . Incontinence   . Mental disorder   . Depression   . Anxiety   . Blood transfusion 1970's  . Shortness of breath     has had a recent increase in episodes of shortness of breath and hyperventilating  . Stroke 2009-2010    "have had 3 strokes"; denies residual  . BPH (benign prostatic hyperplasia)   . H/O acute renal failure 08/2011    severe w/hematuria    Past Surgical History  Procedure Laterality Date  . Incision and drainage of wound      right knee "got piece of wire in it while mowing"  . Inguinal hernia repair      left  . Tonsillectomy      "as a child"    Family History  Problem Relation Age of Onset  . Coronary artery disease Father     History  Substance Use Topics  . Smoking status: Former Smoker -- 1.00 packs/day for 18 years    Types: Cigarettes    Quit date: 07/30/1986  . Smokeless tobacco: Never Used  . Alcohol Use: Yes     Comment: "quit drinking ~ 2007"     smoked crack last time 5 days ago Homeless Review of Systems  Constitutional: Positive for fatigue.  HENT: Negative.   Respiratory: Negative.   Cardiovascular: Negative.   Gastrointestinal: Negative.   Musculoskeletal: Negative.   Skin: Negative.    Neurological: Negative.   Psychiatric/Behavioral: Negative.   All other systems reviewed and are negative.    Allergies  Codeine  Home Medications   Current Outpatient Rx  Name  Route  Sig  Dispense  Refill  . venlafaxine (EFFEXOR-XR) 150 MG 24 hr capsule   Oral   Take 150 mg by mouth daily.         . verapamil (CALAN-SR) 240 MG CR tablet   Oral   Take 240 mg by mouth every morning.           BP 158/76  Pulse 62  Temp(Src) 0 F (-17.8 C)  Resp 20  SpO2 100%  Physical Exam  Nursing note and vitals reviewed. Constitutional: He is oriented to person, place, and time. He appears well-developed and well-nourished.  HENT:  Head: Normocephalic and atraumatic.  Eyes: Conjunctivae are normal. Pupils are equal, round, and reactive to light.  Neck: Neck supple. No tracheal deviation present. No thyromegaly present.  Cardiovascular: Normal rate and regular rhythm.   No murmur heard. Pulmonary/Chest: Effort normal and breath sounds normal.  Abdominal: Soft. Bowel sounds are normal. He exhibits no distension. There is no tenderness.  Musculoskeletal: Normal range of motion. He exhibits no edema and no tenderness.  Neurological: He is alert and oriented to person, place, and time. Coordination normal.  Gait  normal not lightheaded on standing  Skin: Skin is warm and dry. No rash noted.  Psychiatric: He has a normal mood and affect.    ED Course  Procedures (including critical care time)  Labs Reviewed - No data to display No results found.   No diagnosis found.    MDM  Lab data from last ED visit reviewed. Plan followup with PMD at [facility] Diagnosis #1 weakness #2 chronic renal insufficiency         Doug Sou, MD 12/01/12 [id number]

## 2013-03-14 ENCOUNTER — Encounter (HOSPITAL_COMMUNITY): Payer: Self-pay

## 2013-03-14 ENCOUNTER — Emergency Department (HOSPITAL_COMMUNITY)
Admission: EM | Admit: 2013-03-14 | Discharge: 2013-03-14 | Disposition: A | Discharge status: Home / Self Care | Payer: Medicare Other | Attending: Emergency Medicine | Admitting: Emergency Medicine

## 2013-03-14 ENCOUNTER — Emergency Department (HOSPITAL_COMMUNITY): Payer: Medicare Other

## 2013-03-14 DIAGNOSIS — Z87891 Personal history of nicotine dependence: Secondary | ICD-10-CM | POA: Insufficient documentation

## 2013-03-14 DIAGNOSIS — F1410 Cocaine abuse, uncomplicated: Secondary | ICD-10-CM | POA: Insufficient documentation

## 2013-03-14 DIAGNOSIS — N289 Disorder of kidney and ureter, unspecified: Secondary | ICD-10-CM | POA: Insufficient documentation

## 2013-03-14 DIAGNOSIS — R5383 Other fatigue: Secondary | ICD-10-CM | POA: Insufficient documentation

## 2013-03-14 DIAGNOSIS — Z8659 Personal history of other mental and behavioral disorders: Secondary | ICD-10-CM | POA: Insufficient documentation

## 2013-03-14 DIAGNOSIS — I10 Essential (primary) hypertension: Secondary | ICD-10-CM | POA: Insufficient documentation

## 2013-03-14 DIAGNOSIS — Z8709 Personal history of other diseases of the respiratory system: Secondary | ICD-10-CM | POA: Insufficient documentation

## 2013-03-14 DIAGNOSIS — R112 Nausea with vomiting, unspecified: Secondary | ICD-10-CM | POA: Insufficient documentation

## 2013-03-14 DIAGNOSIS — R5381 Other malaise: Secondary | ICD-10-CM | POA: Insufficient documentation

## 2013-03-14 DIAGNOSIS — Z8673 Personal history of transient ischemic attack (TIA), and cerebral infarction without residual deficits: Secondary | ICD-10-CM | POA: Insufficient documentation

## 2013-03-14 DIAGNOSIS — Z87448 Personal history of other diseases of urinary system: Secondary | ICD-10-CM | POA: Insufficient documentation

## 2013-03-14 DIAGNOSIS — M542 Cervicalgia: Secondary | ICD-10-CM | POA: Insufficient documentation

## 2013-03-14 DIAGNOSIS — N390 Urinary tract infection, site not specified: Secondary | ICD-10-CM | POA: Insufficient documentation

## 2013-03-14 DIAGNOSIS — R0602 Shortness of breath: Secondary | ICD-10-CM | POA: Insufficient documentation

## 2013-03-14 LAB — COMPREHENSIVE METABOLIC PANEL
ALT: 21 U/L (ref 0–53)
AST: 17 U/L (ref 0–37)
Alkaline Phosphatase: 98 U/L (ref 39–117)
CO2: 26 mEq/L (ref 19–32)
Calcium: 9.7 mg/dL (ref 8.4–10.5)
Potassium: 4.6 mEq/L (ref 3.5–5.1)
Sodium: 138 mEq/L (ref 135–145)
Total Protein: 7.5 g/dL (ref 6.0–8.3)

## 2013-03-14 LAB — POCT I-STAT TROPONIN I: Troponin i, poc: 0.01 ng/mL (ref 0.00–0.08)

## 2013-03-14 LAB — CBC WITH DIFFERENTIAL/PLATELET
Basophils Absolute: 0 10*3/uL (ref 0.0–0.1)
Eosinophils Absolute: 0 10*3/uL (ref 0.0–0.7)
Eosinophils Relative: 0 % (ref 0–5)
Lymphocytes Relative: 11 % — ABNORMAL LOW (ref 12–46)
MCV: 88.1 fL (ref 78.0–100.0)
Neutrophils Relative %: 85 % — ABNORMAL HIGH (ref 43–77)
Platelets: 237 10*3/uL (ref 150–400)
RBC: 5.3 MIL/uL (ref 4.22–5.81)
RDW: 14 % (ref 11.5–15.5)
WBC: 10 10*3/uL (ref 4.0–10.5)

## 2013-03-14 LAB — URINALYSIS W MICROSCOPIC + REFLEX CULTURE
Ketones, ur: 15 mg/dL — AB
Protein, ur: 30 mg/dL — AB
Urobilinogen, UA: 1 mg/dL (ref 0.0–1.0)

## 2013-03-14 LAB — RAPID URINE DRUG SCREEN, HOSP PERFORMED
Amphetamines: NOT DETECTED
Cocaine: POSITIVE — AB
Opiates: NOT DETECTED
Tetrahydrocannabinol: NOT DETECTED

## 2013-03-14 MED ORDER — SODIUM CHLORIDE 0.9 % IV BOLUS (SEPSIS)
500.0000 mL | Freq: Once | INTRAVENOUS | Status: AC
  Administered 2013-03-14: 500 mL via INTRAVENOUS

## 2013-03-14 MED ORDER — CEPHALEXIN 500 MG PO CAPS: 500.0000 mg | ORAL_CAPSULE | Freq: Four times a day (QID) | ORAL | Status: DC

## 2013-03-14 MED ORDER — ONDANSETRON HCL 4 MG/2ML IJ SOLN: 4.0000 mg | Freq: Once | INTRAMUSCULAR | Status: DC

## 2013-03-14 NOTE — ED Notes (Signed)
N/v with weakness and sob. Duration: 1.5 hrs. Vomited x 2 at home. ems applied o2 and helped relieve. 12 ecg: inverted t waves. Feeling cold and clammy. No syncope. N/v upon arriva. 18 ga.  rt hand.

## 2013-03-14 NOTE — ED Provider Notes (Signed)
CSN: [id number]     Arrival date & time 03/14/13  0600 History     First MD Initiated Contact with Patient 03/14/13 0630     Chief Complaint  Patient presents with  . Nausea  . Emesis  . Neck Pain   (Consider location/radiation/quality/duration/timing/severity/associated sxs/prior Treatment) HPI The patient is a 62 year old male who presents to emergency department with complaint of nausea vomiting and neck pain and shortness of breath onset about 3-4 hours ago. Since taking vomiting twice and contents. Patient admits to doing crack cocaine last night and states that he did not feel well before going to bed. Patient states he is unsure of the source of the echo came back and believes that it may have been laced with something. Patient states on the way to the hospital his symptoms improved he is no longer having any neck pain, nausea, vomiting. Patient denied any chest pain any point, denies any abdominal pain, denies any fever chills. Patient did not take any medications for this prior to coming in. Patient denies any history of cardiac problems however does report history of a stroke, elevated blood pressure, and history of renal failure. Past Medical History  Diagnosis Date  . Hypertension   . Bipolar 1 disorder   . Incontinence   . Mental disorder   . Depression   . Anxiety   . Blood transfusion 1970's  . Shortness of breath     has had a recent increase in episodes of shortness of breath and hyperventilating  . Stroke 2009-2010    "have had 3 strokes"; denies residual  . BPH (benign prostatic hyperplasia)   . H/O acute renal failure 08/2011    severe w/hematuria   Past Surgical History  Procedure Laterality Date  . Incision and drainage of wound      right knee "got piece of wire in it while mowing"  . Inguinal hernia repair      left  . Tonsillectomy      "as a child"   Family History  Problem Relation Age of Onset  . Coronary artery disease Father    History   Substance Use Topics  . Smoking status: Former Smoker -- 1.00 packs/day for 18 years    Types: Cigarettes    Quit date: 07/30/1986  . Smokeless tobacco: Never Used  . Alcohol Use: Yes     Comment: "quit drinking ~ 2007"    Review of Systems  Constitutional: Negative for fever, chills and diaphoresis.  HENT: Positive for neck pain. Negative for sore throat, facial swelling and neck stiffness.   Respiratory: Positive for shortness of breath. Negative for cough and chest tightness.   Cardiovascular: Negative for chest pain, palpitations and leg swelling.  Gastrointestinal: Positive for nausea and vomiting. Negative for abdominal pain, diarrhea and abdominal distention.  Genitourinary: Negative for dysuria, urgency, frequency and hematuria.  Musculoskeletal: Negative for myalgias and arthralgias.  Skin: Negative for rash.  Allergic/Immunologic: Negative for immunocompromised state.  Neurological: Positive for weakness. Negative for dizziness, light-headedness, numbness and headaches.  All other systems reviewed and are negative.    Allergies  Codeine  Home Medications  No current outpatient prescriptions on file. BP 166/78  Pulse 68  Resp 17  SpO2 97% Physical Exam  Nursing note and vitals reviewed. Constitutional: He is oriented to person, place, and time. He appears well-developed and well-nourished. No distress.  HENT:  Head: Normocephalic and atraumatic.  Eyes: Conjunctivae are normal.  Neck: Normal range of motion. Neck supple.  No midline or perivertebral tenderness  Cardiovascular: Normal rate, regular rhythm and normal heart sounds.   Pulmonary/Chest: Effort normal. No respiratory distress. He has no wheezes. He has no rales.  Abdominal: Soft. Bowel sounds are normal. He exhibits no distension. There is no tenderness. There is no rebound.  Genitourinary:  Pt is incontinent of urine  Musculoskeletal: He exhibits no edema.  Neurological: He is alert and oriented to  person, place, and time. No cranial nerve deficit. Coordination normal.  Skin: Skin is warm and dry.    ED Course   Procedures (including critical care time)   Date: 03/14/2013  Rate: 69   Rhythm: normal sinus rhythm  QRS Axis: normal  Intervals: normal  ST/T Wave abnormalities: nonspecific T wave changes T wave inversions in lateral leads  Conduction Disutrbances:none  Narrative Interpretation:   Old EKG Reviewed: unchanged Results for orders placed during the hospital encounter of 03/14/13  CBC WITH DIFFERENTIAL      Result Value Range   WBC 10.0  4.0 - 10.5 K/uL   RBC 5.30  4.22 - 5.81 MIL/uL   Hemoglobin 16.5  13.0 - 17.0 g/dL   HCT 62.1  30.8 - 65.7 %   MCV 88.1  78.0 - 100.0 fL   MCH 31.1  26.0 - 34.0 pg   MCHC 35.3  30.0 - 36.0 g/dL   RDW 84.6  96.2 - 95.2 %   Platelets 237  150 - 400 K/uL   Neutrophils Relative % 85 (*) 43 - 77 %   Neutro Abs 8.5 (*) 1.7 - 7.7 K/uL   Lymphocytes Relative 11 (*) 12 - 46 %   Lymphs Abs 1.1  0.7 - 4.0 K/uL   Monocytes Relative 5  3 - 12 %   Monocytes Absolute 0.5  0.1 - 1.0 K/uL   Eosinophils Relative 0  0 - 5 %   Eosinophils Absolute 0.0  0.0 - 0.7 K/uL   Basophils Relative 0  0 - 1 %   Basophils Absolute 0.0  0.0 - 0.1 K/uL  COMPREHENSIVE METABOLIC PANEL      Result Value Range   Sodium 138  135 - 145 mEq/L   Potassium 4.6  3.5 - 5.1 mEq/L   Chloride 101  96 - 112 mEq/L   CO2 26  19 - 32 mEq/L   Glucose, Bld 159 (*) 70 - 99 mg/dL   BUN 24 (*) 6 - 23 mg/dL   Creatinine, Ser 8.41 (*) 0.50 - 1.35 mg/dL   Calcium 9.7  8.4 - 32.4 mg/dL   Total Protein 7.5  6.0 - 8.3 g/dL   Albumin 4.1  3.5 - 5.2 g/dL   AST 17  0 - 37 U/L   ALT 21  0 - 53 U/L   Alkaline Phosphatase 98  39 - 117 U/L   Total Bilirubin 0.7  0.3 - 1.2 mg/dL   GFR calc non Af Amer 37 (*) >90 mL/min   GFR calc Af Amer 43 (*) >90 mL/min  LIPASE, BLOOD      Result Value Range   Lipase 29  11 - 59 U/L  GLUCOSE, CAPILLARY      Result Value Range    Glucose-Capillary 142 (*) 70 - 99 mg/dL  URINE RAPID DRUG SCREEN (HOSP PERFORMED)      Result Value Range   Opiates NONE DETECTED  NONE DETECTED   Cocaine POSITIVE (*) NONE DETECTED   Benzodiazepines NONE DETECTED  NONE DETECTED   Amphetamines NONE DETECTED  NONE DETECTED   Tetrahydrocannabinol NONE DETECTED  NONE DETECTED   Barbiturates NONE DETECTED  NONE DETECTED  URINALYSIS W MICROSCOPIC + REFLEX CULTURE      Result Value Range   Color, Urine AMBER (*) YELLOW   APPearance TURBID (*) CLEAR   Specific Gravity, Urine 1.026  1.005 - 1.030   pH 5.0  5.0 - 8.0   Glucose, UA 100 (*) NEGATIVE mg/dL   Hgb urine dipstick SMALL (*) NEGATIVE   Bilirubin Urine SMALL (*) NEGATIVE   Ketones, ur 15 (*) NEGATIVE mg/dL   Protein, ur 30 (*) NEGATIVE mg/dL   Urobilinogen, UA 1.0  0.0 - 1.0 mg/dL   Nitrite NEGATIVE  NEGATIVE   Leukocytes, UA MODERATE (*) NEGATIVE   WBC, UA 11-20  <3 WBC/hpf   RBC / HPF 7-10  <3 RBC/hpf   Bacteria, UA FEW (*) RARE   Squamous Epithelial / LPF FEW (*) RARE   Urine-Other MUCOUS PRESENT    POCT I-STAT TROPONIN I      Result Value Range   Troponin i, poc 0.01  0.00 - 0.08 ng/mL   Comment 3            Dg Chest 2 View  03/14/2013   *RADIOLOGY REPORT*  Clinical Data: Nausea and weakness.  CHEST - 2 VIEW  Comparison: 10/30/2011  Findings: Two views of the chest demonstrate subtle densities at both lung bases.  Findings are most compatible with atelectasis. Upper lungs are clear.  Stable appearance of the heart and mediastinum.  The trachea is midline.  No definite pleural effusions.  Stable elevation of the right hemidiaphragm.  IMPRESSION: Bibasilar densities are most compatible with atelectasis.   Original Report Authenticated By: Richarda Overlie, M.D.      No results found.   1. Nausea & vomiting   2. UTI (lower urinary tract infection)   3. Renal insufficiency   4. Cocaine abuse     MDM  Patient emergency department with complaint of nausea vomiting that started  just 3 hours prior to their arrival. patient admits to using crack cocaine yesterday. Patient is currently asymptomatic in the emergency department. Workup is positive for a urinary tract infection, drug screen is positive for cocaine, chronic renal insufficiency with creatinine of 1.85. Patient monitored and 2 troponins obtained which were both negative. Patient has been in ER for 6 hours but no nausea vomiting or any complaints. She has eaten lunch with no problems. Will start him on Keflex for a urinary tract infection. Patient will be discharged home in stable condition with normal vital signs with close followup with his primary care doctor.  Filed Vitals:   03/14/13 0927 03/14/13 1000 03/14/13 1115 03/14/13 1202  BP: 165/81 161/89 145/69 152/73  Pulse: 78 77 90 93  Temp:    98.1 F (36.7 C)  TempSrc:    Oral  Resp: 20 22 18 20   SpO2: 98% 92% 95% 95%       Lottie Mussel, PA-C 03/14/13 1510

## 2013-03-14 NOTE — ED Notes (Signed)
Meal tray requested

## 2013-03-14 NOTE — ED Notes (Signed)
Pt requesting something to eat. Due to vomiting earlier, PO challenge started.

## 2013-03-14 NOTE — ED Notes (Signed)
Pt transported to xray 

## 2013-03-14 NOTE — ED Notes (Signed)
Old and New EKG given to Dr Manus Gunning

## 2013-03-14 NOTE — ED Notes (Signed)
Patient is unable to stand. Sitting and lying vital signs noted for orthostatic VS.

## 2013-03-14 NOTE — ED Notes (Signed)
CBG checked 142 

## 2013-03-14 NOTE — ED Notes (Signed)
ED tech attempted for blood draw and this RN attempted for blood draw.

## 2013-03-15 LAB — URINE CULTURE: Colony Count: >=100000

## 2013-03-15 NOTE — ED Provider Notes (Signed)
Medical screening examination/treatment/procedure(s) were performed by non-physician practitioner and as supervising physician I was immediately available for consultation/collaboration.   Keigan Girten, MD 03/15/13 1225 

## 2013-09-08 ENCOUNTER — Encounter (HOSPITAL_COMMUNITY): Payer: Self-pay | Admitting: Emergency Medicine

## 2013-09-08 ENCOUNTER — Emergency Department (HOSPITAL_COMMUNITY)
Admission: EM | Admit: 2013-09-08 | Discharge: 2013-09-09 | Disposition: A | Discharge status: Home / Self Care | Payer: Medicare Other | Attending: Emergency Medicine | Admitting: Emergency Medicine

## 2013-09-08 DIAGNOSIS — Z9114 Patient's other noncompliance with medication regimen: Secondary | ICD-10-CM

## 2013-09-08 DIAGNOSIS — Z9119 Patient's noncompliance with other medical treatment and regimen: Secondary | ICD-10-CM | POA: Insufficient documentation

## 2013-09-08 DIAGNOSIS — Z8673 Personal history of transient ischemic attack (TIA), and cerebral infarction without residual deficits: Secondary | ICD-10-CM | POA: Insufficient documentation

## 2013-09-08 DIAGNOSIS — Z87891 Personal history of nicotine dependence: Secondary | ICD-10-CM | POA: Insufficient documentation

## 2013-09-08 DIAGNOSIS — I10 Essential (primary) hypertension: Secondary | ICD-10-CM | POA: Insufficient documentation

## 2013-09-08 DIAGNOSIS — Z87448 Personal history of other diseases of urinary system: Secondary | ICD-10-CM | POA: Insufficient documentation

## 2013-09-08 DIAGNOSIS — Z91199 Patient's noncompliance with other medical treatment and regimen due to unspecified reason: Secondary | ICD-10-CM | POA: Insufficient documentation

## 2013-09-08 DIAGNOSIS — F1410 Cocaine abuse, uncomplicated: Secondary | ICD-10-CM | POA: Insufficient documentation

## 2013-09-08 HISTORY — DX: Cocaine abuse, uncomplicated: F14.10

## 2013-09-08 LAB — CBC WITH DIFFERENTIAL/PLATELET
BASOS ABS: 0 10*3/uL (ref 0.0–0.1)
Basophils Relative: 0 % (ref 0–1)
Eosinophils Absolute: 0.1 10*3/uL (ref 0.0–0.7)
Eosinophils Relative: 1 % (ref 0–5)
HEMATOCRIT: 42.9 % (ref 39.0–52.0)
HEMOGLOBIN: 15.5 g/dL (ref 13.0–17.0)
LYMPHS PCT: 37 % (ref 12–46)
Lymphs Abs: 3.4 10*3/uL (ref 0.7–4.0)
MCH: 30.9 pg (ref 26.0–34.0)
MCHC: 36.1 g/dL — ABNORMAL HIGH (ref 30.0–36.0)
MCV: 85.5 fL (ref 78.0–100.0)
MONO ABS: 0.6 10*3/uL (ref 0.1–1.0)
MONOS PCT: 7 % (ref 3–12)
NEUTROS ABS: 4.9 10*3/uL (ref 1.7–7.7)
Neutrophils Relative %: 55 % (ref 43–77)
Platelets: 264 10*3/uL (ref 150–400)
RBC: 5.02 MIL/uL (ref 4.22–5.81)
RDW: 13.7 % (ref 11.5–15.5)
WBC: 9 10*3/uL (ref 4.0–10.5)

## 2013-09-08 LAB — COMPREHENSIVE METABOLIC PANEL
ALK PHOS: 105 U/L (ref 39–117)
ALT: 14 U/L (ref 0–53)
AST: 29 U/L (ref 0–37)
Albumin: 4.2 g/dL (ref 3.5–5.2)
BILIRUBIN TOTAL: 1.1 mg/dL (ref 0.3–1.2)
BUN: 9 mg/dL (ref 6–23)
CHLORIDE: 98 meq/L (ref 96–112)
CO2: 25 meq/L (ref 19–32)
CREATININE: 1.12 mg/dL (ref 0.50–1.35)
Calcium: 9.3 mg/dL (ref 8.4–10.5)
GFR calc Af Amer: 78 mL/min — ABNORMAL LOW (ref >90)
GFR, EST NON AFRICAN AMERICAN: 68 mL/min — AB (ref >90)
Glucose, Bld: 93 mg/dL (ref 70–99)
POTASSIUM: 3.5 meq/L — AB (ref 3.7–5.3)
Sodium: 140 mEq/L (ref 137–147)
Total Protein: 8 g/dL (ref 6.0–8.3)

## 2013-09-08 LAB — ETHANOL: Alcohol, Ethyl (B): <11 mg/dL (ref 0–11)

## 2013-09-08 LAB — RAPID URINE DRUG SCREEN, HOSP PERFORMED
Amphetamines: NOT DETECTED
BARBITURATES: NOT DETECTED
Benzodiazepines: NOT DETECTED
Cocaine: POSITIVE — AB
Opiates: NOT DETECTED
Tetrahydrocannabinol: NOT DETECTED

## 2013-09-08 MED ORDER — ACETAMINOPHEN 325 MG PO TABS: 650.0000 mg | ORAL_TABLET | ORAL | Status: DC | PRN

## 2013-09-08 MED ORDER — ONDANSETRON HCL 4 MG PO TABS
4.0000 mg | ORAL_TABLET | Freq: Three times a day (TID) | ORAL | Status: DC | PRN
Start: 2013-09-08 — End: 2013-09-09

## 2013-09-08 MED ORDER — IBUPROFEN 400 MG PO TABS: 600.0000 mg | ORAL_TABLET | Freq: Three times a day (TID) | ORAL | Status: DC | PRN

## 2013-09-08 MED ORDER — VERAPAMIL HCL 80 MG PO TABS
80.0000 mg | ORAL_TABLET | Freq: Once | ORAL | Status: AC
  Administered 2013-09-09: 80 mg via ORAL
  Filled 2013-09-08: qty 1

## 2013-09-08 MED ORDER — ALUM & MAG HYDROXIDE-SIMETH 200-200-20 MG/5ML PO SUSP: 30.0000 mL | ORAL | Status: DC | PRN

## 2013-09-08 MED ORDER — ACETAMINOPHEN 325 MG PO TABS
650.0000 mg | ORAL_TABLET | Freq: Once | ORAL | Status: AC
  Administered 2013-09-09: 650 mg via ORAL
  Filled 2013-09-08: qty 2

## 2013-09-08 MED ORDER — LORAZEPAM 1 MG PO TABS: 1.0000 mg | ORAL_TABLET | Freq: Three times a day (TID) | ORAL | Status: DC | PRN

## 2013-09-08 MED ORDER — ZOLPIDEM TARTRATE 5 MG PO TABS: 5.0000 mg | ORAL_TABLET | Freq: Every evening | ORAL | Status: DC | PRN

## 2013-09-08 NOTE — ED Notes (Signed)
ED Physician in the room with the patient.

## 2013-09-08 NOTE — ED Provider Notes (Signed)
CSN: [id number]     Arrival date & time 09/08/13  2214 History   First MD Initiated Contact with Patient 09/08/13 2316     Chief Complaint  Patient presents with  . Drug Problem  . Hypertension     (Consider location/radiation/quality/duration/timing/severity/associated sxs/prior Treatment) Patient is a 63 y.o. male presenting with drug problem and hypertension. The history is provided by the patient.  Drug Problem  Hypertension  He has a history of cocaine abuse but states that he had been clean since the beginning of the new year until last night when he relapsed and has smoked crack cocaine until he ran out of money. He wants help getting him back on staying clean. He is also concerned that his blood pressure may be high and that might be leading to another stroke. He states he has had 2 strokes although he states there has been no neurologic deficit from them. He denies ethanol use and denies other drug use. He does have a history of hypertension but states that he has not taken his medication for at least a month. He states that he has developed a bifrontal headache since laying down in the bed in the ED. He states that he has problems with incontinence when he uses cocaine and he did have incontinence of stool and urine last night.  Past Medical History  Diagnosis Date  . Hypertension   . Bipolar 1 disorder   . Incontinence   . Mental disorder   . Depression   . Anxiety   . Blood transfusion 1970's  . Shortness of breath     has had a recent increase in episodes of shortness of breath and hyperventilating  . Stroke 2009-2010    "have had 3 strokes"; denies residual  . BPH (benign prostatic hyperplasia)   . H/O acute renal failure 08/2011    severe w/hematuria  . Cocaine abuse    Past Surgical History  Procedure Laterality Date  . Incision and drainage of wound      right knee "got piece of wire in it while mowing"  . Inguinal hernia repair      left  . Tonsillectomy       "as a child"   Family History  Problem Relation Age of Onset  . Coronary artery disease Father    History  Substance Use Topics  . Smoking status: Former Smoker -- 1.00 packs/day for 18 years    Types: Cigarettes    Quit date: 07/30/1986  . Smokeless tobacco: Never Used  . Alcohol Use: Yes     Comment: "quit drinking ~ 2007"    Review of Systems  All other systems reviewed and are negative.      Allergies  Codeine  Home Medications  No current outpatient prescriptions on file. BP 200/99  Pulse 88  Temp(Src) 98.3 F (36.8 C) (Oral)  Resp 17  Ht 5\' 10"  (1.778 m)  Wt 238 lb (107.956 kg)  BMI 34.15 kg/m2  SpO2 96% Physical Exam  Nursing note and vitals reviewed.  63 year old male, resting comfortably and in no acute distress. Vital signs are significant for tachycardia with heart rate 102, and hypertension with blood pressure 22/80. Oxygen saturation is 94%, which is normal. Head is normocephalic and atraumatic. PERRLA, EOMI. Oropharynx is clear. Neck is nontender and supple without adenopathy or JVD. Back is nontender and there is no CVA tenderness. Lungs are clear without rales, wheezes, or rhonchi. Chest is nontender. Heart has regular rate  and rhythm without murmur. Abdomen is soft, flat, nontender without masses or hepatosplenomegaly and peristalsis is normoactive. Extremities have no cyanosis or edema, full range of motion is present. Skin is warm and dry. There is mild erythema of the periorbital areas and along the bridge of his nose. Mild desquamation is noted in the face and neck areas. Neurologic: Mental status is normal, cranial nerves are intact, there are no motor or sensory deficits. Mild twitching is noted of the left arm and left side of the face which patient states is chronic.  ED Course  Procedures (including critical care time) Labs Review Results for orders placed during the hospital encounter of 09/08/13  ETHANOL      Result Value Ref  Range   Alcohol, Ethyl (B) <11  0 - 11 mg/dL  URINE RAPID DRUG SCREEN (HOSP PERFORMED)      Result Value Ref Range   Opiates NONE DETECTED  NONE DETECTED   Cocaine POSITIVE (*) NONE DETECTED   Benzodiazepines NONE DETECTED  NONE DETECTED   Amphetamines NONE DETECTED  NONE DETECTED   Tetrahydrocannabinol NONE DETECTED  NONE DETECTED   Barbiturates NONE DETECTED  NONE DETECTED  CBC WITH DIFFERENTIAL      Result Value Ref Range   WBC 9.0  4.0 - 10.5 K/uL   RBC 5.02  4.22 - 5.81 MIL/uL   Hemoglobin 15.5  13.0 - 17.0 g/dL   HCT 45.4  09.8 - 11.9 %   MCV 85.5  78.0 - 100.0 fL   MCH 30.9  26.0 - 34.0 pg   MCHC 36.1 (*) 30.0 - 36.0 g/dL   RDW 14.7  82.9 - 56.2 %   Platelets 264  150 - 400 K/uL   Neutrophils Relative % 55  43 - 77 %   Neutro Abs 4.9  1.7 - 7.7 K/uL   Lymphocytes Relative 37  12 - 46 %   Lymphs Abs 3.4  0.7 - 4.0 K/uL   Monocytes Relative 7  3 - 12 %   Monocytes Absolute 0.6  0.1 - 1.0 K/uL   Eosinophils Relative 1  0 - 5 %   Eosinophils Absolute 0.1  0.0 - 0.7 K/uL   Basophils Relative 0  0 - 1 %   Basophils Absolute 0.0  0.0 - 0.1 K/uL  COMPREHENSIVE METABOLIC PANEL      Result Value Ref Range   Sodium 140  137 - 147 mEq/L   Potassium 3.5 (*) 3.7 - 5.3 mEq/L   Chloride 98  96 - 112 mEq/L   CO2 25  19 - 32 mEq/L   Glucose, Bld 93  70 - 99 mg/dL   BUN 9  6 - 23 mg/dL   Creatinine, Ser 1.30  0.50 - 1.35 mg/dL   Calcium 9.3  8.4 - 86.5 mg/dL   Total Protein 8.0  6.0 - 8.3 g/dL   Albumin 4.2  3.5 - 5.2 g/dL   AST 29  0 - 37 U/L   ALT 14  0 - 53 U/L   Alkaline Phosphatase 105  39 - 117 U/L   Total Bilirubin 1.1  0.3 - 1.2 mg/dL   GFR calc non Af Amer 68 (*) >90 mL/min   GFR calc Af Amer 78 (*) >90 mL/min   EKG Interpretation    Date/Time:  Tuesday September 08 2013 22:27:20 EST Ventricular Rate:  94 PR Interval:  160 QRS Duration: 90 QT Interval:  368 QTC Calculation: 460 R Axis:   37 Text  Interpretation:  Normal sinus rhythm Minimal voltage criteria for  LVH, may be normal variant Nonspecific T wave abnormality Prolonged QT Abnormal ECG When compared with ECG of 03/14/2013, T wave abnormality has improved Confirmed by [facility]  MD, Hermon Zea ([id number]) on 09/08/2013 11:31:01 PM            MDM   Final diagnoses:  Cocaine abuse  Hypertension  Noncompliance with medication regimen    Recurrent cocaine abuse. Hypertension exacerbated by medication noncompliance. He'll be given acetaminophen for his headache. Screening labs are significant only for mild hypokalemia need to be given dose of oral potassium and consultation will be obtained with TTS. Old records are reviewed and he has no relevant past visits  Drug screen is positive for cocaine, consistent with his history. Blood pressures come down with dose of verapamil. I have discussed this case with TTS and there is no inpatient program for cocaine. He is not suicidal and has no other indication for inpatient psychiatric care so he will be discharged with outpatient referrals for cocaine abuse. Since he states that he has been out of his verapamil for a long time, I will give him a prescription for a one-month supply and instructed to contact his physician at the [facility] to make sure that all of his prescriptions are filled and up-to-date and that he knows exactly what he is supposed to take and when.  Dione Booze, MD 09/09/13 [phone]

## 2013-09-08 NOTE — ED Notes (Signed)
Pt. requesting detox from Cocaine abuse , last Cocaine this morning , denies suicidal ideation , pt. is hypertensive at triage - pt. is not taking antihypertensive .

## 2013-09-08 NOTE — ED Notes (Signed)
Patient reports he has had strokes in the past. The last one was 2 1/2 years ago.

## 2013-09-09 MED ORDER — VERAPAMIL HCL ER 240 MG PO TBCR: 240.0000 mg | EXTENDED_RELEASE_TABLET | Freq: Every day | ORAL | Status: DC

## 2013-09-09 NOTE — ED Notes (Signed)
[facility] some outpatient resources for this patient.

## 2013-09-09 NOTE — Discharge Instructions (Signed)
There is no inpatient detox for cocaine. If you need assistance, there are a variety of outpatient programs which can help you.  Please contact your doctor at the [facility] and find out what medications you're supposed to be taking any, what doses, and how frequently. Make sure that you have prescriptions for all of your medications and get them filled and do not miss any scheduled medication doses. Please monitor your blood pressure at home.   Arterial Hypertension Arterial hypertension (high blood pressure) is a condition of elevated pressure in your blood vessels. Hypertension over a long period of time is a risk factor for strokes, heart attacks, and heart failure. It is also the leading cause of kidney (renal) failure.  CAUSES   In Adults -- Over 90% of all hypertension has no known cause. This is called essential or primary hypertension. In the other 10% of people with hypertension, the increase in blood pressure is caused by another disorder. This is called secondary hypertension. Important causes of secondary hypertension are:  Heavy alcohol use.  Obstructive sleep apnea.  Hyperaldosterosim (Conn's syndrome).  Steroid use.  Chronic kidney failure.  Hyperparathyroidism.  Medications.  Renal artery stenosis.  Pheochromocytoma.  Cushing's disease.  Coarctation of the aorta.  Scleroderma renal crisis.  Licorice (in excessive amounts).  Drugs (cocaine, methamphetamine). Your caregiver can explain any items above that apply to you.  In Children -- Secondary hypertension is more common and should always be considered.  Pregnancy -- Few women of childbearing age have high blood pressure. However, up to 10% of them develop hypertension of pregnancy. Generally, this will not harm the woman. It may be a sign of 3 complications of pregnancy: preeclampsia, HELLP syndrome, and eclampsia. Follow up and control with medication is necessary. SYMPTOMS   This condition  normally does not produce any noticeable symptoms. It is usually found during a routine exam.  Malignant hypertension is a late problem of high blood pressure. It may have the following symptoms:  Headaches.  Blurred vision.  End-organ damage (this means your kidneys, heart, lungs, and other organs are being damaged).  Stressful situations can increase the blood pressure. If a person with normal blood pressure has their blood pressure go up while being seen by their caregiver, this is often termed "white coat hypertension." Its importance is not known. It may be related with eventually developing hypertension or complications of hypertension.  Hypertension is often confused with mental tension, stress, and anxiety. DIAGNOSIS  The diagnosis is made by 3 separate blood pressure measurements. They are taken at least 1 week apart from each other. If there is organ damage from hypertension, the diagnosis may be made without repeat measurements. Hypertension is usually identified by having blood pressure readings:  Above 140/90 mmHg measured in both arms, at 3 separate times, over a couple weeks.  Over 130/80 mmHg should be considered a risk factor and may require treatment in patients with diabetes. Blood pressure readings over 120/80 mmHg are called "pre-hypertension" even in non-diabetic patients. To get a true blood pressure measurement, use the following guidelines. Be aware of the factors that can alter blood pressure readings.  Take measurements at least 1 hour after caffeine.  Take measurements 30 minutes after smoking and without any stress. This is another reason to quit smoking  it raises your blood pressure.  Use a proper cuff size. Ask your caregiver if you are not sure about your cuff size.  Most home blood pressure cuffs are automatic. They  will measure systolic and diastolic pressures. The systolic pressure is the pressure reading at the start of sounds. Diastolic pressure is  the pressure at which the sounds disappear. If you are elderly, measure pressures in multiple postures. Try sitting, lying or standing.  Sit at rest for a minimum of 5 minutes before taking measurements.  You should not be on any medications like decongestants. These are found in many cold medications.  Record your blood pressure readings and review them with your caregiver. If you have hypertension:  Your caregiver may do tests to be sure you do not have secondary hypertension (see "causes" above).  Your caregiver may also look for signs of metabolic syndrome. This is also called Syndrome X or Insulin Resistance Syndrome. You may have this syndrome if you have type 2 diabetes, abdominal obesity, and abnormal blood lipids in addition to hypertension.  Your caregiver will take your medical and family history and perform a physical exam.  Diagnostic tests may include blood tests (for glucose, cholesterol, potassium, and kidney function), a urinalysis, or an EKG. Other tests may also be necessary depending on your condition. PREVENTION  There are important lifestyle issues that you can adopt to reduce your chance of developing hypertension:  Maintain a normal weight.  Limit the amount of salt (sodium) in your diet.  Exercise often.  Limit alcohol intake.  Get enough potassium in your diet. Discuss specific advice with your caregiver.  Follow a DASH diet (dietary approaches to stop hypertension). This diet is rich in fruits, vegetables, and low-fat dairy products, and avoids certain fats. PROGNOSIS  Essential hypertension cannot be cured. Lifestyle changes and medical treatment can lower blood pressure and reduce complications. The prognosis of secondary hypertension depends on the underlying cause. Many people whose hypertension is controlled with medicine or lifestyle changes can live a normal, healthy life.  RISKS AND COMPLICATIONS  While high blood pressure alone is not an illness,  it often requires treatment due to its short- and long-term effects on many organs. Hypertension increases your risk for:  CVAs or strokes (cerebrovascular accident).  Heart failure due to chronically high blood pressure (hypertensive cardiomyopathy).  Heart attack (myocardial infarction).  Damage to the retina (hypertensive retinopathy).  Kidney failure (hypertensive nephropathy). Your caregiver can explain list items above that apply to you. Treatment of hypertension can significantly reduce the risk of complications. TREATMENT   For overweight patients, weight loss and regular exercise are recommended. Physical fitness lowers blood pressure.  Mild hypertension is usually treated with diet and exercise. A diet rich in fruits and vegetables, fat-free dairy products, and foods low in fat and salt (sodium) can help lower blood pressure. Decreasing salt intake decreases blood pressure in a 1/3 of people.  Stop smoking if you are a smoker. The steps above are highly effective in reducing blood pressure. While these actions are easy to suggest, they are difficult to achieve. Most patients with moderate or severe hypertension end up requiring medications to bring their blood pressure down to a normal level. There are several classes of medications for treatment. Blood pressure pills (antihypertensives) will lower blood pressure by their different actions. Lowering the blood pressure by 10 mmHg may decrease the risk of complications by as much as 25%. The goal of treatment is effective blood pressure control. This will reduce your risk for complications. Your caregiver will help you determine the best treatment for you according to your lifestyle. What is excellent treatment for one person, may not be for you.  HOME CARE INSTRUCTIONS   Do not smoke.  Follow the lifestyle changes outlined in the "Prevention" section.  If you are on medications, follow the directions carefully. Blood pressure  medications must be taken as prescribed. Skipping doses reduces their benefit. It also puts you at risk for problems.  Follow up with your caregiver, as directed.  If you are asked to monitor your blood pressure at home, follow the guidelines in the "Diagnosis" section above. SEEK MEDICAL CARE IF:   You think you are having medication side effects.  You have recurrent headaches or lightheadedness.  You have swelling in your ankles.  You have trouble with your vision. SEEK IMMEDIATE MEDICAL CARE IF:   You have sudden onset of chest pain or pressure, difficulty breathing, or other symptoms of a heart attack.  You have a severe headache.  You have symptoms of a stroke (such as sudden weakness, difficulty speaking, difficulty walking). MAKE SURE YOU:   Understand these instructions.  Will watch your condition.  Will get help right away if you are not doing well or get worse. Document Released: 07/16/2005 Document Revised: 10/08/2011 Document Reviewed: 02/13/2007 [facility] Patient Information 2014 [place], [place].   Chemical Dependency Chemical dependency is an addiction to drugs or alcohol. It is characterized by the repeated behavior of seeking out and using drugs and alcohol despite harmful consequences to the health and safety of ones self and others.  RISK FACTORS There are certain situations or behaviors that increase a person's risk for chemical dependency. These include:  A family history of chemical dependency.  A history of mental health issues, including depression and anxiety.  A home environment where drugs and alcohol are easily available to you.  Drug or alcohol use at a young age. SYMPTOMS  The following symptoms can indicate chemical dependency:  Inability to limit the use of drugs or alcohol.  Nausea, sweating, shakiness, and anxiety that occurs when alcohol or drugs are not being used.  An increase in amount of drugs or alcohol that is necessary to get  drunk or high. People who experience these symptoms can assess their use of drugs and alcohol by asking themselves the following questions:  Have you been told by friends or family that they are worried about your use of alcohol or drugs?  Do friends and family ever tell you about things you did while drinking alcohol or using drugs that you do not remember?  Do you lie about using alcohol or drugs or about the amounts you use?  Do you have difficulty completing daily tasks unless you use alcohol or drugs?  Is the level of your work or school performance lower because of your drug or alcohol use?  Do you get sick from using drugs or alcohol but keep using anyway?  Do you feel uncomfortable in social situations unless you use alcohol or drugs?  Do you use drugs or alcohol to help forget problems? An answer of yes to any of these questions may indicate chemical dependency. Professional evaluation is suggested. Document Released: 07/10/2001 Document Revised: 10/08/2011 Document Reviewed: 09/21/2010 [facility] Patient Information 2014 [place], [place].   Emergency Department Resource Guide  Behavioral Health Resources in the Community: Intensive Outpatient Programs Organization         Address  Phone  Notes  [facility] [address] [place], [place] [phone]   [facility] [address], [place], [place] [phone]   [facility] 119  [address], [place], [place]  [phone]   [facility] [phone],  [place], [place] [phone] or [phone]   Substance Abuse Resources Organization         Address  Phone  Notes  [facility]  [phone]   [facility]  [phone]   [facility]  [phone]   [facility]  [phone]   Residential & Outpatient Substance Abuse Program  [phone]   Psychological Services Organization          Address  Phone  Notes  [facility]  [phone]- [phone]   [facility]  [phone]- [phone]   [facility] [address], [place] [phone] or [phone]    Mobile Crisis Teams Organization         Address  Phone  Notes  [facility], Mobile Crisis Care Unit  [phone]   [facility]  [address] [place], [place] [phone]   Doristine LocksSharon DeEsch [address] [place] [phone]    Self-Help/Support Groups Organization         Address  Phone             Notes  [facility] [place] - variety of support groups  [phone] Call for more information  Narcotics Anonymous (NA), [facility] [address], [place]  2 meetings at this location   [facility] Treatment Programs Organization         Address  Phone  Notes  [facility] [address],    [place]  [phone]   [facility]  [address], [place] [phone], [place], [place] [phone]   [facility] [address], [place] [phone] Admissions: 8am-3pm M-F  [facility] [address]    [place], [place] [phone]   [facility] [address], [place], [place] [phone]   [facility] [address]  [place], [place] [phone]   [facility] - Intensive Outpatient [address], [place], [place] [phone]   [facility] [address]  [place], [place] [phone] or [phone]   [facility] ([facility]) [address] [place], [place] [phone] Accepts Medicaid  [facility] [address]  [place] [phone] Substance Abuse/Addiction Treatment

## 2013-09-09 NOTE — BH Assessment (Signed)
[facility]   Clinician talked to Dr. Preston FleetingGlick.  Patient was wanting detox from cocaine.  Dr. Preston FleetingGlick said that patient had been clean for a year then went on a cocaine binge yesterday.  Dr. Preston FleetingGlick said that a full assessment did not need to be done, just sent over resources for outpatient resources.  Clinician [facility] to Mel[facility] at [facility] and sent the resource list to her to give to patient.

## 2015-11-11 ENCOUNTER — Emergency Department (HOSPITAL_COMMUNITY): Payer: Commercial Managed Care - HMO

## 2015-11-11 ENCOUNTER — Inpatient Hospital Stay (HOSPITAL_COMMUNITY)
Admission: EM | Admit: 2015-11-11 | Discharge: 2015-11-15 | DRG: 065 | Disposition: A | Discharge status: Rehabilitation Facility | Payer: Commercial Managed Care - HMO | Attending: Internal Medicine | Admitting: Internal Medicine

## 2015-11-11 ENCOUNTER — Observation Stay (HOSPITAL_COMMUNITY): Payer: Commercial Managed Care - HMO

## 2015-11-11 ENCOUNTER — Encounter (HOSPITAL_COMMUNITY): Payer: Self-pay

## 2015-11-11 DIAGNOSIS — G459 Transient cerebral ischemic attack, unspecified: Secondary | ICD-10-CM

## 2015-11-11 DIAGNOSIS — Z7982 Long term (current) use of aspirin: Secondary | ICD-10-CM

## 2015-11-11 DIAGNOSIS — I10 Essential (primary) hypertension: Secondary | ICD-10-CM | POA: Diagnosis not present

## 2015-11-11 DIAGNOSIS — I639 Cerebral infarction, unspecified: Principal | ICD-10-CM

## 2015-11-11 DIAGNOSIS — F1421 Cocaine dependence, in remission: Secondary | ICD-10-CM

## 2015-11-11 DIAGNOSIS — I161 Hypertensive emergency: Secondary | ICD-10-CM | POA: Diagnosis not present

## 2015-11-11 DIAGNOSIS — R29810 Facial weakness: Secondary | ICD-10-CM | POA: Diagnosis present

## 2015-11-11 DIAGNOSIS — N390 Urinary tract infection, site not specified: Secondary | ICD-10-CM | POA: Insufficient documentation

## 2015-11-11 DIAGNOSIS — R29700 NIHSS score 0: Secondary | ICD-10-CM | POA: Diagnosis present

## 2015-11-11 DIAGNOSIS — E785 Hyperlipidemia, unspecified: Secondary | ICD-10-CM | POA: Diagnosis not present

## 2015-11-11 DIAGNOSIS — I340 Nonrheumatic mitral (valve) insufficiency: Secondary | ICD-10-CM | POA: Diagnosis present

## 2015-11-11 DIAGNOSIS — R471 Dysarthria and anarthria: Secondary | ICD-10-CM | POA: Diagnosis present

## 2015-11-11 DIAGNOSIS — I159 Secondary hypertension, unspecified: Secondary | ICD-10-CM | POA: Diagnosis not present

## 2015-11-11 DIAGNOSIS — N3000 Acute cystitis without hematuria: Secondary | ICD-10-CM | POA: Diagnosis not present

## 2015-11-11 DIAGNOSIS — F1910 Other psychoactive substance abuse, uncomplicated: Secondary | ICD-10-CM | POA: Insufficient documentation

## 2015-11-11 DIAGNOSIS — B9689 Other specified bacterial agents as the cause of diseases classified elsewhere: Secondary | ICD-10-CM | POA: Diagnosis not present

## 2015-11-11 DIAGNOSIS — Z8673 Personal history of transient ischemic attack (TIA), and cerebral infarction without residual deficits: Secondary | ICD-10-CM | POA: Insufficient documentation

## 2015-11-11 DIAGNOSIS — F3170 Bipolar disorder, currently in remission, most recent episode unspecified: Secondary | ICD-10-CM | POA: Insufficient documentation

## 2015-11-11 DIAGNOSIS — Z87891 Personal history of nicotine dependence: Secondary | ICD-10-CM

## 2015-11-11 DIAGNOSIS — D62 Acute posthemorrhagic anemia: Secondary | ICD-10-CM | POA: Insufficient documentation

## 2015-11-11 DIAGNOSIS — D649 Anemia, unspecified: Secondary | ICD-10-CM | POA: Diagnosis present

## 2015-11-11 DIAGNOSIS — G8191 Hemiplegia, unspecified affecting right dominant side: Secondary | ICD-10-CM | POA: Diagnosis present

## 2015-11-11 DIAGNOSIS — I69354 Hemiplegia and hemiparesis following cerebral infarction affecting left non-dominant side: Secondary | ICD-10-CM | POA: Insufficient documentation

## 2015-11-11 DIAGNOSIS — N400 Enlarged prostate without lower urinary tract symptoms: Secondary | ICD-10-CM | POA: Diagnosis present

## 2015-11-11 DIAGNOSIS — D72829 Elevated white blood cell count, unspecified: Secondary | ICD-10-CM

## 2015-11-11 DIAGNOSIS — N179 Acute kidney failure, unspecified: Secondary | ICD-10-CM | POA: Insufficient documentation

## 2015-11-11 DIAGNOSIS — I638 Other cerebral infarction: Secondary | ICD-10-CM | POA: Diagnosis not present

## 2015-11-11 DIAGNOSIS — R4701 Aphasia: Secondary | ICD-10-CM | POA: Diagnosis present

## 2015-11-11 LAB — URINALYSIS, ROUTINE W REFLEX MICROSCOPIC
Bilirubin Urine: NEGATIVE
Glucose, UA: NEGATIVE mg/dL
Ketones, ur: NEGATIVE mg/dL
NITRITE: POSITIVE — AB
PH: 5.5 (ref 5.0–8.0)
Protein, ur: NEGATIVE mg/dL
SPECIFIC GRAVITY, URINE: 1.021 (ref 1.005–1.030)

## 2015-11-11 LAB — COMPREHENSIVE METABOLIC PANEL
ALK PHOS: 105 U/L (ref 38–126)
ALT: 11 U/L — ABNORMAL LOW (ref 17–63)
ANION GAP: 13 (ref 5–15)
AST: 29 U/L (ref 15–41)
Albumin: 3.5 g/dL (ref 3.5–5.0)
BILIRUBIN TOTAL: 1.7 mg/dL — AB (ref 0.3–1.2)
BUN: 14 mg/dL (ref 6–20)
CALCIUM: 8.7 mg/dL — AB (ref 8.9–10.3)
CO2: 23 mmol/L (ref 22–32)
Chloride: 101 mmol/L (ref 101–111)
Creatinine, Ser: 1.19 mg/dL (ref 0.61–1.24)
GFR calc non Af Amer: >60 mL/min (ref >60)
Glucose, Bld: 190 mg/dL — ABNORMAL HIGH (ref 65–99)
Potassium: 3.7 mmol/L (ref 3.5–5.1)
SODIUM: 137 mmol/L (ref 135–145)
TOTAL PROTEIN: 6.7 g/dL (ref 6.5–8.1)

## 2015-11-11 LAB — DIFFERENTIAL
Basophils Absolute: 0 10*3/uL (ref 0.0–0.1)
Basophils Relative: 0 %
Eosinophils Absolute: 0.3 10*3/uL (ref 0.0–0.7)
Eosinophils Relative: 2 %
LYMPHS ABS: 6.8 10*3/uL — AB (ref 0.7–4.0)
Lymphocytes Relative: 55 %
MONOS PCT: 4 %
Monocytes Absolute: 0.5 10*3/uL (ref 0.1–1.0)
NEUTROS ABS: 4.9 10*3/uL (ref 1.7–7.7)
NEUTROS PCT: 39 %

## 2015-11-11 LAB — I-STAT CHEM 8, ED
BUN: 18 mg/dL (ref 6–20)
CREATININE: 1 mg/dL (ref 0.61–1.24)
Calcium, Ion: 1.03 mmol/L — ABNORMAL LOW (ref 1.13–1.30)
Chloride: 100 mmol/L — ABNORMAL LOW (ref 101–111)
Glucose, Bld: 190 mg/dL — ABNORMAL HIGH (ref 65–99)
HEMATOCRIT: 44 % (ref 39.0–52.0)
HEMOGLOBIN: 15 g/dL (ref 13.0–17.0)
Potassium: 3.6 mmol/L (ref 3.5–5.1)
SODIUM: 140 mmol/L (ref 135–145)
TCO2: 27 mmol/L (ref 0–100)

## 2015-11-11 LAB — RAPID URINE DRUG SCREEN, HOSP PERFORMED
Amphetamines: NOT DETECTED
Barbiturates: NOT DETECTED
Benzodiazepines: NOT DETECTED
Cocaine: NOT DETECTED
OPIATES: NOT DETECTED
Tetrahydrocannabinol: NOT DETECTED

## 2015-11-11 LAB — PROTIME-INR
INR: 1.12 (ref 0.00–1.49)
PROTHROMBIN TIME: 14.6 s (ref 11.6–15.2)

## 2015-11-11 LAB — CBC
HCT: 40.5 % (ref 39.0–52.0)
HEMOGLOBIN: 13.8 g/dL (ref 13.0–17.0)
MCH: 28.6 pg (ref 26.0–34.0)
MCHC: 34.1 g/dL (ref 30.0–36.0)
MCV: 84 fL (ref 78.0–100.0)
Platelets: 251 10*3/uL (ref 150–400)
RBC: 4.82 MIL/uL (ref 4.22–5.81)
RDW: 14.1 % (ref 11.5–15.5)
WBC: 12.5 10*3/uL — ABNORMAL HIGH (ref 4.0–10.5)

## 2015-11-11 LAB — URINE MICROSCOPIC-ADD ON

## 2015-11-11 LAB — CBG MONITORING, ED: Glucose-Capillary: 181 mg/dL — ABNORMAL HIGH (ref 65–99)

## 2015-11-11 LAB — ETHANOL: Alcohol, Ethyl (B): <5 mg/dL (ref <5)

## 2015-11-11 LAB — I-STAT TROPONIN, ED: Troponin i, poc: 0 ng/mL (ref 0.00–0.08)

## 2015-11-11 LAB — APTT: aPTT: 34 seconds (ref 24–37)

## 2015-11-11 MED ORDER — DEXTROSE 5 % IV SOLN
1.0000 g | INTRAVENOUS | Status: DC
  Filled 2015-11-11: qty 10

## 2015-11-11 MED ORDER — ENOXAPARIN SODIUM 40 MG/0.4ML ~~LOC~~ SOLN
40.0000 mg | SUBCUTANEOUS | Status: DC
  Administered 2015-11-12 – 2015-11-14 (×4): 40 mg via SUBCUTANEOUS
  Filled 2015-11-11 (×4): qty 0.4

## 2015-11-11 MED ORDER — ASPIRIN EC 81 MG PO TBEC
81.0000 mg | DELAYED_RELEASE_TABLET | Freq: Every day | ORAL | Status: DC
  Administered 2015-11-12 – 2015-11-15 (×4): 81 mg via ORAL
  Filled 2015-11-11 (×4): qty 1

## 2015-11-11 MED ORDER — ACETAMINOPHEN 650 MG RE SUPP: 650.0000 mg | RECTAL | Status: DC | PRN

## 2015-11-11 MED ORDER — SIMVASTATIN 10 MG PO TABS: 10.0000 mg | ORAL_TABLET | Freq: Every day | ORAL | Status: DC

## 2015-11-11 MED ORDER — SENNOSIDES-DOCUSATE SODIUM 8.6-50 MG PO TABS: 1.0000 | ORAL_TABLET | Freq: Every evening | ORAL | Status: DC | PRN

## 2015-11-11 MED ORDER — LISINOPRIL 20 MG PO TABS: 20.0000 mg | ORAL_TABLET | Freq: Every day | ORAL | Status: DC

## 2015-11-11 MED ORDER — ACETAMINOPHEN 325 MG PO TABS: 650.0000 mg | ORAL_TABLET | ORAL | Status: DC | PRN

## 2015-11-11 MED ORDER — VITAMIN D3 25 MCG (1000 UNIT) PO TABS
2000.0000 [IU] | ORAL_TABLET | Freq: Every day | ORAL | Status: DC
  Administered 2015-11-12 – 2015-11-15 (×4): 2000 [IU] via ORAL
  Filled 2015-11-11 (×8): qty 2

## 2015-11-11 MED ORDER — ATORVASTATIN CALCIUM 40 MG PO TABS
40.0000 mg | ORAL_TABLET | Freq: Every day | ORAL | Status: DC
  Administered 2015-11-12 – 2015-11-14 (×4): 40 mg via ORAL
  Filled 2015-11-11 (×4): qty 1

## 2015-11-11 MED ORDER — CEFTRIAXONE SODIUM 1 G IJ SOLR
1.0000 g | Freq: Once | INTRAMUSCULAR | Status: AC
  Administered 2015-11-11: 1 g via INTRAVENOUS
  Filled 2015-11-11: qty 10

## 2015-11-11 MED ORDER — STROKE: EARLY STAGES OF RECOVERY BOOK
Freq: Once | Status: AC
  Administered 2015-11-12: 1
  Filled 2015-11-11: qty 1

## 2015-11-11 MED ORDER — LABETALOL HCL 5 MG/ML IV SOLN
20.0000 mg | Freq: Once | INTRAVENOUS | Status: AC
  Administered 2015-11-11: 20 mg via INTRAVENOUS
  Filled 2015-11-11: qty 4

## 2015-11-11 NOTE — H&P (Signed)
Date: 11/11/2015               Patient Name:  Dominic Pineda MRN: [id number]  DOB: 11-11-1950 Age / Sex: 65 y.o., male   PCP: Provider Not In System         Medical Service: Internal Medicine Teaching Service         Attending Physician: Dr. Burns SpainElizabeth A Butcher, MD    First Contact: Dr. Su GrandNick Taylor Pager: [phone]  Second Contact: Dr. 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 History is corroborated by his wife and daughter. He was in his normal state of health until around 4:30pm this afternoon, when he started having slurred speech, right sided facial droop with drooling from the right corner of his mouth, and possibly leaning a bit to his right side. He says he felt overall 'weak' but denied focal right-sided weakness when further questioned. He also felt 'groggy', was slightly disoriented, had associated blurred vision, and a slight generalized headache. EMS was called and his BP was noted to be 250/130 at that time. En route to the ED, his symptoms began to improve and currently at bedside, he is asymptomatic. He says this feels different than his previous CVAs. He denies fever, recent illness, malaise, syncope, chest pain, palpitations, shortness of breath, N/V/D, urinary symptoms, focal numbness or weakness, or other symptoms at this time. He is a former 18pyh smoker, denies EtOH use (last drank a few months ago), does have a history of crack cocaine but last used 3 months ago, denies IVDU or other drug use at this time.  Meds: No current facility-administered medications for this encounter.   Current Outpatient Prescriptions  Medication Sig Dispense  Refill  . aspirin EC 81 MG tablet Take 81 mg by mouth daily.    . Cholecalciferol (VITAMIN D) 2000 units CAPS Take 2,000 Units by mouth daily.    Marland Kitchen. lisinopril (PRINIVIL,ZESTRIL) 10 MG tablet Take 10 mg by mouth daily.    . simvastatin (ZOCOR) 10 MG tablet Take 10 mg by mouth daily at 6 PM.    . verapamil (CALAN-SR) 240 MG CR tablet Take 1 tablet (240 mg total) by mouth at bedtime. (Patient taking differently: Take 240 mg by mouth every morning. ) 30 tablet 0    Allergies: Allergies as of 11/11/2015 - Review Complete 11/11/2015  Allergen Reaction Noted  . Codeine Nausea Only 09/22/2011   Past Medical History  Diagnosis Date  . Hypertension   . Bipolar 1 disorder (HCC)   . Incontinence   . Mental disorder   . Depression   . Anxiety   . Blood transfusion 1970's  . Shortness of breath     has had a recent increase in episodes of shortness of breath and hyperventilating  . Stroke [facility](HCC) 2009-2010    "have had 3 strokes"; denies residual  . BPH (benign prostatic hyperplasia)   . H/O acute renal failure 08/2011    severe w/hematuria  . Cocaine abuse    Past Surgical History  Procedure Laterality Date  . Incision and drainage of wound      right knee "got piece of wire in  it while mowing"  . Inguinal hernia repair      left  . Tonsillectomy      "as a child"   Family History  Problem Relation Age of Onset  . Coronary artery disease Father    Social History   Social History  . Marital Status: Divorced    Spouse Name: N/A  . Number of Children: N/A  . Years of Education: N/A   Occupational History  . Not on file.   Social History Main Topics  . Smoking status: Former Smoker -- 1.00 packs/day for 18 years    Types: Cigarettes    Quit date: 07/30/1986  . Smokeless tobacco: Never Used  . Alcohol Use: Yes     Comment: "quit drinking ~ 2007"  . Drug Use: Yes    Special: "Crack" cocaine, Marijuana     Comment: 10/30/11 "last cocaine 07/2011"  . Sexual Activity: Yes    Other Topics Concern  . Not on file   Social History Narrative    Review of Systems: Pertinent items noted in HPI and remainder of comprehensive ROS otherwise negative.  Physical Exam: Blood pressure 111/82, pulse 55, temperature 98.7 F (37.1 C), resp. rate 14, SpO2 98 %.   Gen: Well-appearing, alert and oriented to person, place, and time HEENT: Oropharynx clear without erythema or exudate.  Neck: No cervical LAD, no thyromegaly or nodules, no JVD noted. No carotid bruits heard.  CV: Normal rate, regular rhythm, no murmurs, rubs, or gallops Pulmonary: Normal effort, CTA bilaterally, no crackles or wheezes Abdominal: Soft, non-tender, non-distended, without rebound, guarding, or masses Extremities: Distal pulses 2+ in upper and lower extremities bilaterally, no tenderness, erythema, chronic venous stasis changes in bilateral lower extremities. Neuro: CN II-XII grossly intact, faint flattening of nasolabial fold on right, otherwise no appreciable facial droop, rightward-beating horizontal nystagmus with EOM, 5/5 strength in upper and lower extremities bilaterally. Normal sensation to fine touch bilaterally. Reflexes 2+ symmetrically. No finger-to-nose or heel-to-shin dysmetria, no dysdiadochokinesia. Skin: No atypical appearing moles. No rashes  Lab results: Basic Metabolic Panel:  Recent Labs  40/98/11 1750 11/11/15 1810  NA 137 140  K 3.7 3.6  CL 101 100*  CO2 23  --   GLUCOSE 190* 190*  BUN 14 18  CREATININE 1.19 1.00  CALCIUM 8.7*  --    Liver Function Tests:  Recent Labs  11/11/15 1750  AST 29  ALT 11*  ALKPHOS 105  BILITOT 1.7*  PROT 6.7  ALBUMIN 3.5  CBC:  Recent Labs  11/11/15 1750 11/11/15 1810  WBC 12.5*  --   NEUTROABS 4.9  --   HGB 13.8 15.0  HCT 40.5 44.0  MCV 84.0  --   PLT 251  --    CBG:  Recent Labs  11/11/15 1742  GLUCAP 181*   Coagulation:  Recent Labs  11/11/15 1750  LABPROT 14.6  INR 1.12   Urine Drug  Screen: Drugs of Abuse     Component Value Date/Time   LABOPIA NONE DETECTED 11/11/2015 1825   COCAINSCRNUR NONE DETECTED 11/11/2015 1825   LABBENZ NONE DETECTED 11/11/2015 1825   AMPHETMU NONE DETECTED 11/11/2015 1825   THCU NONE DETECTED 11/11/2015 1825   LABBARB NONE DETECTED 11/11/2015 1825    Alcohol Level:  Recent Labs  11/11/15 1750  ETH <5   Urinalysis:  Recent Labs  11/11/15 1825  COLORURINE AMBER*  LABSPEC 1.021  PHURINE 5.5  GLUCOSEU NEGATIVE  HGBUR SMALL*  BILIRUBINUR NEGATIVE  KETONESUR NEGATIVE  PROTEINUR  NEGATIVE  NITRITE POSITIVE*  LEUKOCYTESUR LARGE*   Imaging results:  Ct Head Wo Contrast  11/11/2015  CLINICAL DATA:  65 year old male with left-sided arm weakness and slurred speech. Altered mental status. EXAM: CT HEAD WITHOUT CONTRAST TECHNIQUE: Contiguous axial images were obtained from the base of the skull through the vertex without intravenous contrast. COMPARISON:  Head CT 10/30/2011.  MRI of the brain 10/31/2011. FINDINGS: As with the prior examinations there is extensive low attenuation throughout the right MCA distribution, compatible with chronic areas of encephalomalacia. This is very similar to prior study from 10/30/2011. Moderate cerebral atrophy. No acute intracranial abnormalities. Specifically, no evidence of acute intracranial hemorrhage, no definite findings of acute/subacute cerebral ischemia, no mass, mass effect, hydrocephalus or abnormal intra or extra-axial fluid collections. Visualized paranasal sinuses and mastoids are well pneumatized. No acute displaced skull fractures are identified. IMPRESSION: 1. No acute intracranial abnormalities. 2. 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 3. Moderate cerebral atrophy. Electronically Signed   By: Trudie Reed M.D.   On: 11/11/2015 17:55   Other results: EKG: normal sinus rhythm, borderline  repolarization abnormality.  Assessment & Plan by Problem: 1. Acute CVA of L corona radiata - pt with transient focal neurologic symptoms in the setting of very high BPs initially, with h/o poorly-controlled HTN and multiple CVAs. UDS here negative, patient admits compliance with his meds. Possible faint residual symptoms, otherwise resolved currently. EKG unremarkable. CT without new findings, with old R MCA encephalomalacia. MRI confirms acute CVA of L corona radiata.  -Consulted neurology; appreciate the thoughtful care of this mutual patient -TTE, carotid dopplers -Permissive HTN in the acute setting -Lipid panel; A1c -Continue ASA 81. Pt on simvastatin  which is low-intensity. Would rec high-intensity for secondary prevention. Will start atorvastatin  here -PT/OT/SLP recs greatly appreciated -Telemetry  2. Acute uncomplicated cystitis - patient with mild leukocytosis and UA suggestive of UTI despite without symptoms.  -Pt did receive ceftriaxone IV x 1 in ED; will switch to oral TMP-SMX x 3 day total starting tomorrow -F/u UCx -F/u CBC  Dispo: Disposition is deferred at this time, awaiting improvement of current medical problems. Anticipated discharge in approximately 1-2 day(s).   The patient does have a current PCP [facility]) and does need an [facility] hospital follow-up appointment after discharge.  The patient does not have transportation limitations that hinder transportation to clinic appointments.  Signed: Darrick Huntsman, MD 11/11/2015, 9:53 PM

## 2015-11-11 NOTE — Consult Note (Signed)
Admission H&P    Chief Complaint: New-onset slurred speech and right-sided weakness.  HPI: Dominic Pineda is an 65 y.o. male history of hypertension, hyperlipidemia, previous stroke, bipolar disorder and cocaine abuse, brought to the emergency room following acute onset of slurred speech and right-sided weakness. Patient was last known well at 4:40 PM today. Deficits improved since arrival. CT scan of his head was unremarkable. MRI showed an acute subcentimeter left corona radiata infarction. Old right MCA stroke was also noted. He has not been on antiplatelet therapy. NIH stroke score at the time of this evaluation was 0. He was noted to have markedly elevated blood pressure on arrival in the ED which is being managed emergently.  LSN: 4:40 PM on 11/11/2015 tPA Given: No: Deficits rapidly resolved mRankin:  Past Medical History  Diagnosis Date  . Hypertension   . Bipolar 1 disorder ([place])   . Incontinence   . Mental disorder   . Depression   . Anxiety   . Blood transfusion 1970's  . Shortness of breath     has had a recent increase in episodes of shortness of breath and hyperventilating  . Stroke [facility]) 2009-2010    "have had 3 strokes"; denies residual  . BPH (benign prostatic hyperplasia)   . H/O acute renal failure 08/2011    severe w/hematuria  . Cocaine abuse     Past Surgical History  Procedure Laterality Date  . Incision and drainage of wound      right knee "got piece of wire in it while mowing"  . Inguinal hernia repair      left  . Tonsillectomy      "as a child"    Family History  Problem Relation Age of Onset  . Coronary artery disease Father    Social History:  reports that he quit smoking about 29 years ago. His smoking use included Cigarettes. He has a 18 pack-year smoking history. He has never used smokeless tobacco. He reports that he drinks alcohol. He reports that he uses illicit drugs ("Crack" cocaine and Marijuana).  Allergies:  Allergies  Allergen  Reactions  . Codeine Nausea Only    Medications Prior to Admission  Medication Sig Dispense Refill  . aspirin EC 81 MG tablet Take 81 mg by mouth daily.    . Cholecalciferol (VITAMIN D) 2000 units CAPS Take 2,000 Units by mouth daily.    Marland Kitchen lisinopril (PRINIVIL,ZESTRIL) 10 MG tablet Take 10 mg by mouth daily.    . simvastatin (ZOCOR) 10 MG tablet Take 10 mg by mouth daily at 6 PM.    . verapamil (CALAN-SR) 240 MG CR tablet Take 1 tablet (240 mg total) by mouth at bedtime. (Patient taking differently: Take 240 mg by mouth every morning. ) 30 tablet 0    ROS: History obtained from the patient  General ROS: negative for - chills, fatigue, fever, night sweats, weight gain or weight loss Psychological ROS: negative for - behavioral disorder, hallucinations, memory difficulties, mood swings or suicidal ideation Ophthalmic ROS: negative for - blurry vision, double vision, eye pain or loss of vision ENT ROS: negative for - epistaxis, nasal discharge, oral lesions, sore throat, tinnitus or vertigo Allergy and Immunology ROS: negative for - hives or itchy/watery eyes Hematological and Lymphatic ROS: negative for - bleeding problems, bruising or swollen lymph nodes Endocrine ROS: negative for - galactorrhea, hair pattern changes, polydipsia/polyuria or temperature intolerance Respiratory ROS: negative for - cough, hemoptysis, shortness of breath or wheezing Cardiovascular ROS: negative for -  chest pain, dyspnea on exertion, edema or irregular heartbeat Gastrointestinal ROS: negative for - abdominal pain, diarrhea, hematemesis, nausea/vomiting or stool incontinence Genito-Urinary ROS: negative for - dysuria, hematuria, incontinence or urinary frequency/urgency Musculoskeletal ROS: negative for - joint swelling or muscular weakness Neurological ROS: as noted in HPI Dermatological ROS: negative for rash and skin lesion changes  Physical Examination: Blood pressure 199/111, pulse 62, temperature 98.4  F (36.9 C), temperature source Axillary, resp. rate 18, height '5\' 10"'$  (1.778 m), weight 127.177 kg (280 lb 6 oz), SpO2 96 %.  HEENT-  Normocephalic, no lesions, without obvious abnormality.  Normal external eye and conjunctiva.  Normal TM's bilaterally.  Normal auditory canals and external ears. Normal external nose, mucus membranes and septum.  Normal pharynx. Neck supple with no masses, nodes, nodules or enlargement. Cardiovascular - regular rate and rhythm, S1, S2 normal, no murmur, click, rub or gallop Lungs - chest clear, no wheezing, rales, normal symmetric air entry Abdomen - soft, non-tender; bowel sounds normal; no masses,  no organomegaly Extremities - no joint deformities, effusion, or inflammation and no edema  Neurologic Examination: Mental Status: Alert, oriented, no acute distress.  Speech fluent without evidence of aphasia. Able to follow commands without difficulty. Cranial Nerves: II-Visual fields were normal. III/IV/VI-Pupils were equal and reacted normally to light. Extraocular movements were full and conjugate.    V/VII-no facial numbness and no facial weakness. VIII-normal. X-normal speech and symmetrical palatal movement. XI: trapezius strength/neck flexion strength normal bilaterally XII-midline tongue extension with normal strength. Motor: 5/5 bilaterally with normal tone and bulk Sensory: Normal throughout. Deep Tendon Reflexes: 2+ and symmetric. Plantars: Mute bilaterally Cerebellar: Normal finger-to-nose testing.  Results for orders placed or performed during the hospital encounter of 11/11/15 (from the past 48 hour(s))  CBG monitoring, ED     Status: Abnormal   Collection Time: 11/11/15  5:42 PM  Result Value Ref Range   Glucose-Capillary 181 (H) 65 - 99 mg/dL  Ethanol     Status: None   Collection Time: 11/11/15  5:50 PM  Result Value Ref Range   Alcohol, Ethyl (B) <5 <5 mg/dL    Comment:        LOWEST DETECTABLE LIMIT FOR SERUM ALCOHOL IS 5  mg/dL FOR MEDICAL PURPOSES ONLY   Protime-INR     Status: None   Collection Time: 11/11/15  5:50 PM  Result Value Ref Range   Prothrombin Time 14.6 11.6 - 15.2 seconds   INR 1.12 0.00 - 1.49  APTT     Status: None   Collection Time: 11/11/15  5:50 PM  Result Value Ref Range   aPTT 34 24 - 37 seconds  CBC     Status: Abnormal   Collection Time: 11/11/15  5:50 PM  Result Value Ref Range   WBC 12.5 (H) 4.0 - 10.5 K/uL   RBC 4.82 4.22 - 5.81 MIL/uL   Hemoglobin 13.8 13.0 - 17.0 g/dL   HCT 40.5 39.0 - 52.0 %   MCV 84.0 78.0 - 100.0 fL   MCH 28.6 26.0 - 34.0 pg   MCHC 34.1 30.0 - 36.0 g/dL   RDW 14.1 11.5 - 15.5 %   Platelets 251 150 - 400 K/uL  Differential     Status: Abnormal   Collection Time: 11/11/15  5:50 PM  Result Value Ref Range   Neutrophils Relative % 39 %   Lymphocytes Relative 55 %   Monocytes Relative 4 %   Eosinophils Relative 2 %   Basophils Relative 0 %  Neutro Abs 4.9 1.7 - 7.7 K/uL   Lymphs Abs 6.8 (H) 0.7 - 4.0 K/uL   Monocytes Absolute 0.5 0.1 - 1.0 K/uL   Eosinophils Absolute 0.3 0.0 - 0.7 K/uL   Basophils Absolute 0.0 0.0 - 0.1 K/uL   WBC Morphology ATYPICAL LYMPHOCYTES   Comprehensive metabolic panel     Status: Abnormal   Collection Time: 11/11/15  5:50 PM  Result Value Ref Range   Sodium 137 135 - 145 mmol/L   Potassium 3.7 3.5 - 5.1 mmol/L    Comment: SPECIMEN HEMOLYZED. HEMOLYSIS MAY AFFECT INTEGRITY OF RESULTS.   Chloride 101 101 - 111 mmol/L   CO2 23 22 - 32 mmol/L   Glucose, Bld 190 (H) 65 - 99 mg/dL   BUN 14 6 - 20 mg/dL   Creatinine, Ser 1.19 0.61 - 1.24 mg/dL   Calcium 8.7 (L) 8.9 - 10.3 mg/dL   Total Protein 6.7 6.5 - 8.1 g/dL   Albumin 3.5 3.5 - 5.0 g/dL   AST 29 15 - 41 U/L   ALT 11 (L) 17 - 63 U/L   Alkaline Phosphatase 105 38 - 126 U/L   Total Bilirubin 1.7 (H) 0.3 - 1.2 mg/dL   GFR calc non Af Amer >60 >60 mL/min   GFR calc Af Amer >60 >60 mL/min    Comment: (NOTE) The eGFR has been calculated using the CKD EPI  equation. This calculation has not been validated in all clinical situations. eGFR's persistently <60 mL/min signify possible Chronic Kidney Disease.    Anion gap 13 5 - 15  I-stat troponin, ED (not at [facility], [facility])     Status: None   Collection Time: 11/11/15  6:09 PM  Result Value Ref Range   Troponin i, poc 0.00 0.00 - 0.08 ng/mL   Comment 3            Comment: Due to the release kinetics of cTnI, a negative result within the first hours of the onset of symptoms does not rule out myocardial infarction with certainty. If myocardial infarction is still suspected, repeat the test at appropriate intervals.   I-Stat Chem 8, ED  (not at [facility], [facility])     Status: Abnormal   Collection Time: 11/11/15  6:10 PM  Result Value Ref Range   Sodium 140 135 - 145 mmol/L   Potassium 3.6 3.5 - 5.1 mmol/L   Chloride 100 (L) 101 - 111 mmol/L   BUN 18 6 - 20 mg/dL   Creatinine, Ser 1.00 0.61 - 1.24 mg/dL   Glucose, Bld 190 (H) 65 - 99 mg/dL   Calcium, Ion 1.03 (L) 1.13 - 1.30 mmol/L   TCO2 27 0 - 100 mmol/L   Hemoglobin 15.0 13.0 - 17.0 g/dL   HCT 44.0 39.0 - 52.0 %  Urine rapid drug screen (hosp performed)not at [facility]     Status: None   Collection Time: 11/11/15  6:25 PM  Result Value Ref Range   Opiates NONE DETECTED NONE DETECTED   Cocaine NONE DETECTED NONE DETECTED   Benzodiazepines NONE DETECTED NONE DETECTED   Amphetamines NONE DETECTED NONE DETECTED   Tetrahydrocannabinol NONE DETECTED NONE DETECTED   Barbiturates NONE DETECTED NONE DETECTED    Comment:        DRUG SCREEN FOR MEDICAL PURPOSES ONLY.  IF CONFIRMATION IS NEEDED FOR ANY PURPOSE, NOTIFY LAB WITHIN 5 DAYS.        LOWEST DETECTABLE LIMITS FOR URINE DRUG SCREEN Drug Class  Cutoff (ng/mL) Amphetamine      1000 Barbiturate      200 Benzodiazepine   008 Tricyclics       676 Opiates          300 Cocaine          300 THC              50   Urinalysis, Routine w reflex microscopic (not at [facility])     Status: Abnormal    Collection Time: 11/11/15  6:25 PM  Result Value Ref Range   Color, Urine AMBER (A) YELLOW    Comment: BIOCHEMICALS MAY BE AFFECTED BY COLOR   APPearance TURBID (A) CLEAR   Specific Gravity, Urine 1.021 1.005 - 1.030   pH 5.5 5.0 - 8.0   Glucose, UA NEGATIVE NEGATIVE mg/dL   Hgb urine dipstick SMALL (A) NEGATIVE   Bilirubin Urine NEGATIVE NEGATIVE   Ketones, ur NEGATIVE NEGATIVE mg/dL   Protein, ur NEGATIVE NEGATIVE mg/dL   Nitrite POSITIVE (A) NEGATIVE   Leukocytes, UA LARGE (A) NEGATIVE  Urine microscopic-add on     Status: Abnormal   Collection Time: 11/11/15  6:25 PM  Result Value Ref Range   Squamous Epithelial / LPF 6-30 (A) NONE SEEN   WBC, UA TOO NUMEROUS TO COUNT 0 - 5 WBC/hpf   RBC / HPF 0-5 0 - 5 RBC/hpf   Bacteria, UA MANY (A) NONE SEEN   Ct Head Wo Contrast  11/11/2015  CLINICAL DATA:  65 year old male with left-sided arm weakness and slurred speech. Altered mental status. EXAM: CT HEAD WITHOUT CONTRAST TECHNIQUE: Contiguous axial images were obtained from the base of the skull through the vertex without intravenous contrast. COMPARISON:  Head CT 10/30/2011.  MRI of the brain 10/31/2011. FINDINGS: As with the prior examinations there is extensive low attenuation throughout the right MCA distribution, compatible with chronic areas of encephalomalacia. This is very similar to prior study from 10/30/2011. Moderate cerebral atrophy. No acute intracranial abnormalities. Specifically, no evidence of acute intracranial hemorrhage, no definite findings of acute/subacute cerebral ischemia, no mass, mass effect, hydrocephalus or abnormal intra or extra-axial fluid collections. Visualized paranasal sinuses and mastoids are well pneumatized. No acute displaced skull fractures are identified. IMPRESSION: 1. No acute intracranial abnormalities. 2. 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 3.  Moderate cerebral atrophy. Electronically Signed   By: Vinnie Langton M.D.   On: 11/11/2015 17:55   Mr Brain Wo Contrast  11/11/2015  CLINICAL DATA:  Slurred speech and RIGHT-sided weakness beginning at 1640 hours today. Symptoms now improving. History of hypertension, stroke, cocaine abuse. EXAM: MRI HEAD WITHOUT CONTRAST TECHNIQUE: Multiplanar, multiecho pulse sequences of the brain and surrounding structures were obtained without intravenous contrast. COMPARISON:  CT head November 11, 2015 at 1731 hours and MRI of the brain October 31, 2011 FINDINGS: Mild motion degraded examination. 7 mm focus of reduced diffusion LEFT corona radiata with low ADC value. No susceptibility artifact to suggest hemorrhage. No susceptibility artifact to suggest hemorrhage. RIGHT frontotemporoparietal encephalomalacia with ex vacuo dilatation RIGHT lateral ventricle. Moderate to severe ventriculomegaly on the basis of global parenchymal brain volume loss. Patchy to confluent supratentorial white matter FLAIR T2 hyperintensities exclusive of the aforementioned abnormality. Small area LEFT occipital lobe encephalomalacia. Small bilateral cerebellar infarcts. No midline shift, mass effect or masses. No abnormal extra-axial fluid collections. No extra-axial masses though, contrast enhanced sequences would be more sensitive. Normal major  intracranial vascular flow voids seen at the skull base. Ocular globes and orbital contents are unremarkable though not tailored for evaluation. No abnormal sellar expansion, no pituitary cyst identified on today's examination. No suspicious calvarial bone marrow signal. Craniocervical junction maintained. Visualized paranasal sinuses and mastoid air cells are well-aerated. Multiple suboccipital sebaceous cysts inferred on the diffusion-weighted sequence examination. IMPRESSION: Mildly motion degraded examination. Acute subcentimeter LEFT corona radiata infarct. Old large RIGHT MCA territory infarct.  Moderate to severe advanced global brain atrophy for age. Moderate to severe chronic small vessel ischemic disease. Old small cerebellar infarcts. Small area LEFT occipital lobe encephalomalacia may be posttraumatic or ischemic. Electronically Signed   By: Elon Alas M.D.   On: 11/11/2015 22:48    Assessment: 65 y.o. male with multiple risk factors for stroke presenting with hypertensive urgency, as well as an acute small left corona radiata infarction.  Stroke Risk Factors - hyperlipidemia, hypertension and cocaine abuse.  Plan: 1. HgbA1c, fasting lipid panel 2. MRA  of the brain without contrast 3. PT consult, OT consult, Speech consult 4. Echocardiogram 5. Carotid dopplers 6. Prophylactic therapy-Antiplatelet med: Aspirin  7. Risk factor modification 8. Telemetry monitoring  C.R. Nicole Kindred, MD [facility] [phone]  11/11/2015, 11:32 PM

## 2015-11-11 NOTE — ED Provider Notes (Signed)
CSN: [id number]     Arrival date & time 11/11/15  1733 History   First MD Initiated Contact with Patient 11/11/15 1759     Chief Complaint  Patient presents with  . Aphasia     (Consider location/radiation/quality/duration/timing/severity/associated sxs/prior Treatment) HPI   Dominic Pineda is a 65 y.o. male who presents for evaluation of slurred speech and right-sided weakness, which was first noticed this morning, around 16:40 p.m, today. Since that time, the symptoms have improved. He was concerned because he felt like his blood pressure was high and when he checked it it was "over 200". He states that he is taking his usual medications, as directed. His family members are here and they state that his symptoms of slurred speech and weakness have improved. He does not use a cane. He has history of a left brain stroke. He has chronic dysarthria. He denies headache, fever, chills, cough or chest pain. There are no other known modifying factors.   Past Medical History  Diagnosis Date  . Hypertension   . Bipolar 1 disorder (HCC)   . Incontinence   . Mental disorder   . Depression   . Anxiety   . Blood transfusion 1970's  . Shortness of breath     has had a recent increase in episodes of shortness of breath and hyperventilating  . Stroke [facility](HCC) 2009-2010    "have had 3 strokes"; denies residual  . BPH (benign prostatic hyperplasia)   . H/O acute renal failure 08/2011    severe w/hematuria  . Cocaine abuse    Past Surgical History  Procedure Laterality Date  . Incision and drainage of wound      right knee "got piece of wire in it while mowing"  . Inguinal hernia repair      left  . Tonsillectomy      "as a child"   Family History  Problem Relation Age of Onset  . Coronary artery disease Father    Social History  Substance Use Topics  . Smoking status: Former Smoker -- 1.00 packs/day for 18 years    Types: Cigarettes    Quit date: 07/30/1986  . Smokeless tobacco: Never  Used  . Alcohol Use: Yes     Comment: "quit drinking ~ 2007"    Review of Systems  All other systems reviewed and are negative.     Allergies  Codeine  Home Medications   Prior to Admission medications   Medication Sig Start Date End Date Taking? Authorizing Provider  aspirin EC 81 MG tablet Take 81 mg by mouth daily.   Yes Historical Provider, MD  Cholecalciferol (VITAMIN D) 2000 units CAPS Take 2,000 Units by mouth daily.   Yes Historical Provider, MD  lisinopril (PRINIVIL,ZESTRIL) 10 MG tablet Take 10 mg by mouth daily.   Yes Historical Provider, MD  simvastatin (ZOCOR) 10 MG tablet Take 10 mg by mouth daily at 6 PM.   Yes Historical Provider, MD  verapamil (CALAN-SR) 240 MG CR tablet Take 1 tablet (240 mg total) by mouth at bedtime. Patient taking differently: Take 240 mg by mouth every morning.  09/09/13  Yes Dione Boozeavid Glick, MD   BP 165/80 mmHg  Pulse 57  Temp(Src) 98.7 F (37.1 C)  Resp 21  SpO2 93% Physical Exam  Constitutional: He is oriented to person, place, and time. He appears well-developed.  Obese, appears older than stated age.  HENT:  Head: Normocephalic and atraumatic.  Right Ear: External ear normal.  Left Ear: External  ear normal.  Eyes: Conjunctivae and EOM are normal. Pupils are equal, round, and reactive to light.  Neck: Normal range of motion and phonation normal. Neck supple.  Cardiovascular: Normal rate, regular rhythm and normal heart sounds.   Blood pressure right arm, 208/88, blood pressure after arm, 185/96, both manual.  Pulmonary/Chest: Effort normal and breath sounds normal. He exhibits no bony tenderness.  Abdominal: Soft. He exhibits no distension. There is no tenderness. There is no guarding.  Musculoskeletal: Normal range of motion. He exhibits edema (3+ lower legs bilaterally).  Neurological: He is alert and oriented to person, place, and time. No cranial nerve deficit or sensory deficit. He exhibits normal muscle tone. Coordination  normal.  Dysarthria is present. No aphasia, or nystagmus.  Skin: Skin is warm, dry and intact.  Psychiatric: He has a normal mood and affect. His behavior is normal. Judgment and thought content normal.  Nursing note and vitals reviewed.   ED Course  Procedures (including critical care time)  Initial clinical impression- resolving central neurologic symptoms, consistent with TIA. Elevated blood pressure, is nonspecific, but could indicate PRES syndrome.    Medications  cefTRIAXone (ROCEPHIN) 1 g in dextrose 5 % 50 mL IVPB (not administered)  labetalol (NORMODYNE,TRANDATE) injection 20 mg (20 mg Intravenous Given 11/11/15 1945)    Patient Vitals for the past 24 hrs:  BP Temp Pulse Resp SpO2  11/11/15 2015 165/80 mmHg - (!) 57 21 93 %  11/11/15 2000 167/61 mmHg - (!) 54 13 96 %  11/11/15 1930 185/96 mmHg - (!) 57 14 97 %  11/11/15 1915 (!) 208/88 mmHg - (!) 59 18 94 %  11/11/15 1908 172/94 mmHg - - - -  11/11/15 1901 (!) 216/88 mmHg - - - -  11/11/15 1900 199/89 mmHg - (!) 57 18 97 %  11/11/15 1845 (!) 168/106 mmHg - (!) 59 19 96 %  11/11/15 1815 181/77 mmHg - 62 15 97 %  11/11/15 1800 177/79 mmHg - (!) 59 20 97 %  11/11/15 1754 - 98.7 F (37.1 C) - - -  11/11/15 1742 192/81 mmHg - 64 23 97 %    8:14 PM Reevaluation with update and discussion. After initial assessment and treatment, an updated evaluation reveals he remains clinically lucid. Rebekah Sprinkle L   8:20 PM-Consult complete with [facility] resident. Patient case explained and discussed. She agrees to admit patient for further evaluation and treatment. Call ended at 2035  CRITICAL CARE Performed by: Flint Melter Total critical care time: 35 minutes Critical care time was exclusive of separately billable procedures and treating other patients. Critical care was necessary to treat or prevent imminent or life-threatening deterioration. Critical care was time spent personally by me on the following activities: development of  treatment plan with patient and/or surrogate as well as nursing, discussions with consultants, evaluation of patient's response to treatment, examination of patient, obtaining history from patient or surrogate, ordering and performing treatments and interventions, ordering and review of laboratory studies, ordering and review of radiographic studies, pulse oximetry and re-evaluation of patient's condition.  Labs Review Labs Reviewed  CBC - Abnormal; Notable for the following:    WBC 12.5 (*)    All other components within normal limits  DIFFERENTIAL - Abnormal; Notable for the following:    Lymphs Abs 6.8 (*)    All other components within normal limits  COMPREHENSIVE METABOLIC PANEL - Abnormal; Notable for the following:    Glucose, Bld 190 (*)    Calcium 8.7 (*)  ALT 11 (*)    Total Bilirubin 1.7 (*)    All other components within normal limits  URINALYSIS, ROUTINE W REFLEX MICROSCOPIC (NOT AT [facility]) - Abnormal; Notable for the following:    Color, Urine AMBER (*)    APPearance TURBID (*)    Hgb urine dipstick SMALL (*)    Nitrite POSITIVE (*)    Leukocytes, UA LARGE (*)    All other components within normal limits  URINE MICROSCOPIC-ADD ON - Abnormal; Notable for the following:    Squamous Epithelial / LPF 6-30 (*)    Bacteria, UA MANY (*)    All other components within normal limits  CBG MONITORING, ED - Abnormal; Notable for the following:    Glucose-Capillary 181 (*)    All other components within normal limits  I-STAT CHEM 8, ED - Abnormal; Notable for the following:    Chloride 100 (*)    Glucose, Bld 190 (*)    Calcium, Ion 1.03 (*)    All other components within normal limits  URINE CULTURE  ETHANOL  PROTIME-INR  APTT  URINE RAPID DRUG SCREEN, HOSP PERFORMED  I-STAT TROPOININ, ED    Imaging Review Ct Head Wo Contrast  11/11/2015  CLINICAL DATA:  65 year old male with left-sided arm weakness and slurred speech. Altered mental status. EXAM: CT HEAD WITHOUT  CONTRAST TECHNIQUE: Contiguous axial images were obtained from the base of the skull through the vertex without intravenous contrast. COMPARISON:  Head CT 10/30/2011.  MRI of the brain 10/31/2011. FINDINGS: As with the prior examinations there is extensive low attenuation throughout the right MCA distribution, compatible with chronic areas of encephalomalacia. This is very similar to prior study from 10/30/2011. Moderate cerebral atrophy. No acute intracranial abnormalities. Specifically, no evidence of acute intracranial hemorrhage, no definite findings of acute/subacute cerebral ischemia, no mass, mass effect, hydrocephalus or abnormal intra or extra-axial fluid collections. Visualized paranasal sinuses and mastoids are well pneumatized. No acute displaced skull fractures are identified. IMPRESSION: 1. No acute intracranial abnormalities. 2. 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 3. Moderate cerebral atrophy. Electronically Signed   By: Trudie Reed M.D.   On: 11/11/2015 17:55   I have personally reviewed and evaluated these images and lab results as part of my medical decision-making.   EKG Interpretation   Date/Time:  Friday November 11 2015 17:42:14 EDT Ventricular Rate:  66 PR Interval:  183 QRS Duration: 100 QT Interval:  414 QTC Calculation: 434 R Axis:   38 Text Interpretation:  Sinus rhythm Borderline repolarization abnormality  Since last tracing Non-specific ST-t changes are new Confirmed by Effie Shy   MD, Mechele Collin ([id number]) on 11/11/2015 8:02:43 PM      MDM   Final diagnoses:  Transient cerebral ischemia, unspecified transient cerebral ischemia type  Secondary hypertension, unspecified  Urinary tract infection without hematuria, site unspecified    Transient central neurologic symptoms, most likely related to TIA. Mild hypertension, without clear evidence for hypertensive urgency.  Require admission, for further evaluation and treatment. Incidental urinary tract infection. Urine drug screen today is negative, he commonly uses cocaine, on prior evaluations. He will require admission for further treatment, management of hypertension, and neurologic consultation. MRI ordered, to begin workup.  Nursing Notes Reviewed/ Care Coordinated, and agree without changes. Applicable Imaging Reviewed.  Interpretation of Laboratory Data incorporated into ED treatment  Plan: Admit    Mancel Bale, MD 11/11/15 2040

## 2015-11-11 NOTE — ED Notes (Signed)
Pt from home, family noticed some slurred speech and R sided weakness. LSN = 1640 today. EMS states, no weakness, some slurred speech. Pt a/o to self and place. Trouble following commands. Hx: Htn and Stroke.

## 2015-11-12 ENCOUNTER — Observation Stay (HOSPITAL_COMMUNITY): Payer: Commercial Managed Care - HMO

## 2015-11-12 DIAGNOSIS — E785 Hyperlipidemia, unspecified: Secondary | ICD-10-CM | POA: Diagnosis present

## 2015-11-12 DIAGNOSIS — F3170 Bipolar disorder, currently in remission, most recent episode unspecified: Secondary | ICD-10-CM | POA: Diagnosis present

## 2015-11-12 DIAGNOSIS — R29700 NIHSS score 0: Secondary | ICD-10-CM | POA: Diagnosis present

## 2015-11-12 DIAGNOSIS — I69354 Hemiplegia and hemiparesis following cerebral infarction affecting left non-dominant side: Secondary | ICD-10-CM | POA: Diagnosis not present

## 2015-11-12 DIAGNOSIS — I639 Cerebral infarction, unspecified: Secondary | ICD-10-CM | POA: Diagnosis present

## 2015-11-12 DIAGNOSIS — I159 Secondary hypertension, unspecified: Secondary | ICD-10-CM | POA: Diagnosis present

## 2015-11-12 DIAGNOSIS — N400 Enlarged prostate without lower urinary tract symptoms: Secondary | ICD-10-CM | POA: Diagnosis present

## 2015-11-12 DIAGNOSIS — Z7982 Long term (current) use of aspirin: Secondary | ICD-10-CM | POA: Diagnosis not present

## 2015-11-12 DIAGNOSIS — I6789 Other cerebrovascular disease: Secondary | ICD-10-CM | POA: Diagnosis not present

## 2015-11-12 DIAGNOSIS — R4701 Aphasia: Secondary | ICD-10-CM | POA: Diagnosis present

## 2015-11-12 DIAGNOSIS — R29810 Facial weakness: Secondary | ICD-10-CM | POA: Diagnosis present

## 2015-11-12 DIAGNOSIS — I10 Essential (primary) hypertension: Secondary | ICD-10-CM | POA: Diagnosis present

## 2015-11-12 DIAGNOSIS — I6329 Cerebral infarction due to unspecified occlusion or stenosis of other precerebral arteries: Secondary | ICD-10-CM | POA: Diagnosis not present

## 2015-11-12 DIAGNOSIS — I6330 Cerebral infarction due to thrombosis of unspecified cerebral artery: Secondary | ICD-10-CM | POA: Diagnosis not present

## 2015-11-12 DIAGNOSIS — R471 Dysarthria and anarthria: Secondary | ICD-10-CM | POA: Diagnosis present

## 2015-11-12 DIAGNOSIS — D649 Anemia, unspecified: Secondary | ICD-10-CM | POA: Diagnosis present

## 2015-11-12 DIAGNOSIS — I69951 Hemiplegia and hemiparesis following unspecified cerebrovascular disease affecting right dominant side: Secondary | ICD-10-CM | POA: Diagnosis not present

## 2015-11-12 DIAGNOSIS — G8191 Hemiplegia, unspecified affecting right dominant side: Secondary | ICD-10-CM | POA: Diagnosis present

## 2015-11-12 DIAGNOSIS — I340 Nonrheumatic mitral (valve) insufficiency: Secondary | ICD-10-CM | POA: Diagnosis present

## 2015-11-12 DIAGNOSIS — B9689 Other specified bacterial agents as the cause of diseases classified elsewhere: Secondary | ICD-10-CM | POA: Diagnosis not present

## 2015-11-12 DIAGNOSIS — I638 Other cerebral infarction: Secondary | ICD-10-CM | POA: Diagnosis not present

## 2015-11-12 DIAGNOSIS — F1910 Other psychoactive substance abuse, uncomplicated: Secondary | ICD-10-CM | POA: Insufficient documentation

## 2015-11-12 DIAGNOSIS — Z6841 Body Mass Index (BMI) 40.0 and over, adult: Secondary | ICD-10-CM | POA: Diagnosis not present

## 2015-11-12 DIAGNOSIS — N3000 Acute cystitis without hematuria: Secondary | ICD-10-CM | POA: Diagnosis present

## 2015-11-12 DIAGNOSIS — Z87891 Personal history of nicotine dependence: Secondary | ICD-10-CM | POA: Diagnosis not present

## 2015-11-12 DIAGNOSIS — F0280 Dementia in other diseases classified elsewhere without behavioral disturbance: Secondary | ICD-10-CM | POA: Diagnosis not present

## 2015-11-12 DIAGNOSIS — D62 Acute posthemorrhagic anemia: Secondary | ICD-10-CM | POA: Diagnosis not present

## 2015-11-12 LAB — ECHOCARDIOGRAM COMPLETE
Height: 70 in
Weight: 4486.41 oz

## 2015-11-12 LAB — CBC
HEMATOCRIT: 37.6 % — AB (ref 39.0–52.0)
HEMOGLOBIN: 12.8 g/dL — AB (ref 13.0–17.0)
MCH: 28.6 pg (ref 26.0–34.0)
MCHC: 34 g/dL (ref 30.0–36.0)
MCV: 84.1 fL (ref 78.0–100.0)
Platelets: 211 10*3/uL (ref 150–400)
RBC: 4.47 MIL/uL (ref 4.22–5.81)
RDW: 14.2 % (ref 11.5–15.5)
WBC: 14.2 10*3/uL — AB (ref 4.0–10.5)

## 2015-11-12 LAB — COMPREHENSIVE METABOLIC PANEL
ALT: 8 U/L — ABNORMAL LOW (ref 17–63)
ANION GAP: 12 (ref 5–15)
AST: 10 U/L — AB (ref 15–41)
Albumin: 3.2 g/dL — ABNORMAL LOW (ref 3.5–5.0)
Alkaline Phosphatase: 96 U/L (ref 38–126)
BILIRUBIN TOTAL: 1 mg/dL (ref 0.3–1.2)
BUN: 12 mg/dL (ref 6–20)
CO2: 23 mmol/L (ref 22–32)
Calcium: 8.7 mg/dL — ABNORMAL LOW (ref 8.9–10.3)
Chloride: 107 mmol/L (ref 101–111)
Creatinine, Ser: 1.01 mg/dL (ref 0.61–1.24)
GFR calc Af Amer: >60 mL/min (ref >60)
GFR calc non Af Amer: >60 mL/min (ref >60)
GLUCOSE: 108 mg/dL — AB (ref 65–99)
POTASSIUM: 3.3 mmol/L — AB (ref 3.5–5.1)
SODIUM: 142 mmol/L (ref 135–145)
Total Protein: 6.5 g/dL (ref 6.5–8.1)

## 2015-11-12 LAB — LIPID PANEL
CHOLESTEROL: 209 mg/dL — AB (ref 0–200)
HDL: 35 mg/dL — ABNORMAL LOW (ref >40)
LDL Cholesterol: 155 mg/dL — ABNORMAL HIGH (ref 0–99)
Total CHOL/HDL Ratio: 6 RATIO
Triglycerides: 93 mg/dL (ref <150)
VLDL: 19 mg/dL (ref 0–40)

## 2015-11-12 LAB — MRSA PCR SCREENING: MRSA BY PCR: NEGATIVE

## 2015-11-12 MED ORDER — CIPROFLOXACIN HCL 500 MG PO TABS
500.0000 mg | ORAL_TABLET | Freq: Two times a day (BID) | ORAL | Status: DC
  Administered 2015-11-12 – 2015-11-15 (×7): 500 mg via ORAL
  Filled 2015-11-12 (×7): qty 1

## 2015-11-12 MED ORDER — HYDRALAZINE HCL 20 MG/ML IJ SOLN
5.0000 mg | Freq: Four times a day (QID) | INTRAMUSCULAR | Status: DC | PRN
  Administered 2015-11-12: 5 mg via INTRAVENOUS
  Filled 2015-11-12: qty 1

## 2015-11-12 MED ORDER — PERFLUTREN LIPID MICROSPHERE
1.0000 mL | INTRAVENOUS | Status: AC | PRN
  Administered 2015-11-12: 3 mL via INTRAVENOUS
  Filled 2015-11-12 (×2): qty 10

## 2015-11-12 NOTE — Progress Notes (Signed)
VASCULAR LAB PRELIMINARY  PRELIMINARY  PRELIMINARY  PRELIMINARY  Carotid duplex completed.    Preliminary report:  Bilateral: intimal wall thickening CCA.  Moderate plaque origin and proximal ICA, L>R.  Right: 1-39% ICA stenosis.  Left: 40-59% ICA stenosis.  Right vertebral artery flow not insonated secondary to body habitus and breathing interference.  Left vertebral artery flow is antegrade.   Dominic Pineda, RVT 11/12/2015, 9:37 AM

## 2015-11-12 NOTE — Progress Notes (Signed)
Dominic Pineda contacted Dr. Isabella Pineda to report hypertension. Dr. Isabella Pineda entered new order for hydralazine with BP parameters to follow. MD ok with HTN SBP less 220. Patient resting quietly in bed. callbell in reach. Will continue to monitor.

## 2015-11-12 NOTE — Progress Notes (Signed)
Arrived to rm  5C10 via stretcher accompanied by ED staff member and family. Telemetry monitor in place.IV fluids flowing patent in IV to left hand. Incontinent of urine, bed linen and gown changed by staff, pericare provided. Alert and oriented, speech slurred. Will cont to monitor.

## 2015-11-12 NOTE — Progress Notes (Signed)
Subjective: Dominic Pineda. He has had some upset stomach, but was able to eat breakfast without issue.  He denies chest pain, SOB, or other weakness.  Objective: Vital signs in last 24 hours: Filed Vitals:   11/12/15 0117 11/12/15 0317 11/12/15 0517 11/12/15 0735  BP: 201/77 176/72 188/87 180/82  Pulse: 62 60 65 62  Temp: 98.7 F (37.1 C) 97.5 F (36.4 C) 97.5 F (36.4 C) 97.8 F (36.6 C)  TempSrc: Oral Oral Oral Oral  Resp: Height:      Weight:      SpO2: 100% 97% 97% 95%   Weight change:   Intake/Output Summary (Last 24 hours) at 11/12/15 0848 Last data filed at 11/12/15 0600  Gross per 24 hour  Intake      0 ml  Output    200 ml  Net   -200 ml   Physical Exam  Constitutional: He is oriented to person, place, and time.  Morbidly obese male, lying in bed, conversant, NAD.  HENT:  Head: Normocephalic and atraumatic.  Eyes: EOM are normal.  Neck: No tracheal deviation present.  Cardiovascular: Normal rate and regular rhythm.   Heart sounds distant.  No murmurs appreciated.  Pulmonary/Chest: Effort normal. No stridor. No respiratory distress. He has no wheezes.  Minimal dry crackles in bilateral bases.  No wheezes.  Abdominal: Soft. There is no rebound and no guarding.  Large. Mildly painful to deep palpation in LUQ. No masses appreciated.    Musculoskeletal: He exhibits no edema.  Neurological: He is alert and oriented to person, place, and time.  Skin: Skin is warm and dry.    Lab Results: Basic Metabolic Panel:  Recent Labs Lab 11/11/15 1750 11/11/15 1810 11/12/15 0541  NA 137 140 142  K 3.7 3.6 3.3*  CL 101 100* 107  CO2 23  --  23  GLUCOSE 190* 190* 108*  BUN CREATININE 1.19 1.00 1.01  CALCIUM 8.7*  --  8.7*   Liver Function Tests:  Recent Labs Lab 11/11/15 1750 11/12/15 0541  AST 29 10*  ALT 11* 8*  ALKPHOS 105 96  BILITOT 1.7* 1.0  PROT 6.7 6.5  ALBUMIN 3.5 3.2*   No results for input(s): LIPASE, AMYLASE in the last  168 hours. No results for input(s): AMMONIA in the last 168 hours. CBC:  Recent Labs Lab 11/11/15 1750 11/11/15 1810 11/12/15 0541  WBC 12.5*  --  14.2*  NEUTROABS 4.9  --   --   HGB 13.8 15.0 12.8*  HCT 40.5 44.0 37.6*  MCV 84.0  --  84.1  PLT 251  --  211   Cardiac Enzymes: No results for input(s): CKTOTAL, CKMB, CKMBINDEX, TROPONINI in the last 168 hours. BNP: No results for input(s): PROBNP in the last 168 hours. D-Dimer: No results for input(s): DDIMER in the last 168 hours. CBG:  Recent Labs Lab 11/11/15 1742  GLUCAP 181*   Hemoglobin A1C: No results for input(s): HGBA1C in the last 168 hours. Fasting Lipid Panel:  Recent Labs Lab 11/12/15 0541  CHOL 209*  HDL 35*  LDLCALC 155*  TRIG 93  CHOLHDL 6.0   Thyroid Function Tests: No results for input(s): TSH, T4TOTAL, FREET4, T3FREE, THYROIDAB in the last 168 hours. Coagulation:  Recent Labs Lab 11/11/15 1750  LABPROT 14.6  INR 1.12   Anemia Panel: No results for input(s): VITAMINB12, FOLATE, FERRITIN, TIBC, IRON, RETICCTPCT in the last 168 hours. Urine Drug Screen: Drugs of Abuse  Component Value Date/Time   LABOPIA NONE DETECTED 11/11/2015 1825   COCAINSCRNUR NONE DETECTED 11/11/2015 1825   LABBENZ NONE DETECTED 11/11/2015 1825   AMPHETMU NONE DETECTED 11/11/2015 1825   THCU NONE DETECTED 11/11/2015 1825   LABBARB NONE DETECTED 11/11/2015 1825    Alcohol Level:  Recent Labs Lab 11/11/15 1750  ETH <5   Urinalysis:  Recent Labs Lab 11/11/15 1825  COLORURINE AMBER*  LABSPEC 1.021  PHURINE 5.5  GLUCOSEU NEGATIVE  HGBUR SMALL*  BILIRUBINUR NEGATIVE  KETONESUR NEGATIVE  PROTEINUR NEGATIVE  NITRITE POSITIVE*  LEUKOCYTESUR LARGE*   Misc. Labs:   Micro Results: No results found for this or any previous visit (from the past 240 hour(s)). Studies/Results: Ct Head Wo Contrast  11/11/2015  CLINICAL DATA:  65 year old male with left-sided arm weakness and slurred speech. Altered  mental status. EXAM: CT HEAD WITHOUT CONTRAST TECHNIQUE: Contiguous axial images were obtained from the base of the skull through the vertex without intravenous contrast. COMPARISON:  Head CT 10/30/2011.  MRI of the brain 10/31/2011. FINDINGS: As with the prior examinations there is extensive low attenuation throughout the right MCA distribution, compatible with chronic areas of encephalomalacia. This is very similar to prior study from 10/30/2011. Moderate cerebral atrophy. No acute intracranial abnormalities. Specifically, no evidence of acute intracranial hemorrhage, no definite findings of acute/subacute cerebral ischemia, no mass, mass effect, hydrocephalus or abnormal intra or extra-axial fluid collections. Visualized paranasal sinuses and mastoids are well pneumatized. No acute displaced skull fractures are identified. IMPRESSION: 1. No acute intracranial abnormalities. 2. 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 3. Moderate cerebral atrophy. Electronically Signed   By: Trudie Reed M.D.   On: 11/11/2015 17:55   Mr Brain Wo Contrast  11/11/2015  CLINICAL DATA:  Slurred speech and RIGHT-sided weakness beginning at 1640 hours today. Symptoms now improving. History of hypertension, stroke, cocaine abuse. EXAM: MRI HEAD WITHOUT CONTRAST TECHNIQUE: Multiplanar, multiecho pulse sequences of the brain and surrounding structures were obtained without intravenous contrast. COMPARISON:  CT head November 11, 2015 at 1731 hours and MRI of the brain October 31, 2011 FINDINGS: Mild motion degraded examination. 7 mm focus of reduced diffusion LEFT corona radiata with low ADC value. No susceptibility artifact to suggest hemorrhage. No susceptibility artifact to suggest hemorrhage. RIGHT frontotemporoparietal encephalomalacia with ex vacuo dilatation RIGHT lateral ventricle. Moderate to severe ventriculomegaly on the basis of  global parenchymal brain volume loss. Patchy to confluent supratentorial white matter FLAIR T2 hyperintensities exclusive of the aforementioned abnormality. Small area LEFT occipital lobe encephalomalacia. Small bilateral cerebellar infarcts. No midline shift, mass effect or masses. No abnormal extra-axial fluid collections. No extra-axial masses though, contrast enhanced sequences would be more sensitive. Normal major intracranial vascular flow voids seen at the skull base. Ocular globes and orbital contents are unremarkable though not tailored for evaluation. No abnormal sellar expansion, no pituitary cyst identified on today's examination. No suspicious calvarial bone marrow signal. Craniocervical junction maintained. Visualized paranasal sinuses and mastoid air cells are well-aerated. Multiple suboccipital sebaceous cysts inferred on the diffusion-weighted sequence examination. IMPRESSION: Mildly motion degraded examination. Acute subcentimeter LEFT corona radiata infarct. Old large RIGHT MCA territory infarct. Moderate to severe advanced global brain atrophy for age. Moderate to severe chronic small vessel ischemic disease. Old small cerebellar infarcts. Small area LEFT occipital lobe encephalomalacia may be posttraumatic or ischemic. Electronically Signed   By: Awilda Metro M.D.   On: 11/11/2015 22:48  Medications: I have reviewed the patient's current medications. Scheduled Meds: . aspirin EC  81 mg Oral Daily  . atorvastatin  40 mg Oral q1800  . cefTRIAXone (ROCEPHIN)  IV  1 g Intravenous Q24H  . cholecalciferol  2,000 Units Oral Daily  . enoxaparin (LOVENOX) injection  40 mg Subcutaneous Q24H   Continuous Infusions:  PRN Meds:.acetaminophen **OR** acetaminophen, hydrALAZINE, senna-docusate Assessment/Plan: Principal Problem:   Stroke [facility](HCC) Active Problems:   Severe hypertension   Urinary tract infectious disease  Mr. Jyl HeinzChavis is a 65yo with HTN, h/o crack cocaine abuse, multiple CVAs  (last 2010) who presents with slurred speech and that earlier this afternoon.  Acute CVA of L corona radiata: Patient presents with transient focal neurologic symptoms in the setting of poorly-controlled HTN and multiple CVAs. UDS here negative. Patient reports compliance with his meds. Possible faint residual symptoms, otherwise resolved currently.  CT without new findings, with old R MCA encephalomalacia. MRI confirms acute CVA of L corona radiata.  Carotid US shows 1-39% on the right and 40-59%on the left. Neurology recommending continued prophylaxis with ASA.  Will confirm that we don't need to switch antiplatelet.  Lipids elevated so will switch to high intensity statin. - Neurology consult [ ]  TTE [ ]  MRA - Permissive HTN in the acute setting, restart BP medications this evening - Atorvastatin 40 mg [ ]  A1c - ASA 81 mg -PT/OT/SLP recs greatly appreciated -Telemetry  Acute cystitis: Patient with mild leukocytosis and UA suggestive of UTI without symptoms. He is s/p 1g CTX in the ED.  Given he is male, we will treat as complicated cystitis.   [ ]  UCx - Ciprofloxacin 500 mg BID  FEN/GI: - HH  DVT Ppx: Lovenox  Dispo: Disposition is deferred at this time, awaiting improvement of current medical problems.  Anticipated discharge in approximately 1-2 day(s).   The patient does not have a current PCP (Provider Not In System) and [facility].  The patient does not have transportation limitations that hinder transportation to clinic appointments.  .Services Needed at time of discharge: Y = Yes, Blank = No PT:   OT:   RN:   Equipment:   Other:       Jana HalfNicholas A Aquiles Ruffini, MD 11/12/2015, 8:48 AM

## 2015-11-12 NOTE — Progress Notes (Addendum)
Daughter called this morning wanting an update. Daughter is emergency contact,  rn called back and told daughter that the doctors were still working the pt up for stroke and some of the results were not back yet. Daughter will be up later to see pt.   Daughter confirmed that pt does live with his sister at home. Pt was receiving home health benefits through [employer], but sister discontinued them. Daughter not happy with level of care pt is receiving at home.

## 2015-11-12 NOTE — Progress Notes (Signed)
Echocardiogram 2D Echocardiogram with Definity has been performed.  Nolon RodBrown, Tony 11/12/2015, 2:57 PM

## 2015-11-12 NOTE — Evaluation (Signed)
Occupational Therapy Evaluation Patient Details Name: Dominic Pineda MRN: [id number] DOB: 1951/06/30 Today's Date: 11/12/2015    History of Present Illness Pt is a 65 y/o male who presents with slurred speech and R-sided weakness. MRI revealed an acute subcentimeter L corona radiata infarction. PMH includes HTN, hyperlipidemia, previous CVA, bipolar disorder and cocaine abuse.     Clinical Impression   Pt reports he was independent with BADLs and mobility PTA; required assist from sister with getting into bath tub. Pt currently overall min-mod assist for ADLs and functional mobility. Pt required VCs throughout all activities for sequencing, initiation, and safety. Pt with incontinent bowel episode during session; pt able to assist with cleaning self but required mod assist. Recommending CIR level therapies for follow up in order to maximize independence and safety with ADLs and functional mobility upon return home. Pt would benefit from continued skilled OT to address established goals.   If pt does not d/c to CIR; feel he would benefit from a [facility] aide to assist with ADLs upon return home.     Follow Up Recommendations  CIR;Supervision/Assistance - 24 hour    Equipment Recommendations  Other (comment) (TBD)    Recommendations for Other Services       Precautions / Restrictions Precautions Precautions: Fall Restrictions Weight Bearing Restrictions: No      Mobility Bed Mobility Overal bed mobility: Needs Assistance Bed Mobility: Supine to Sit     Supine to sit: Min assist;HOB elevated Sit to supine: Min assist   General bed mobility comments: Min hand held assist to pull trunk into sitting position. VCs throughout for use of hand rails, technique, sequencing, and initiation.  Transfers Overall transfer level: Needs assistance Equipment used: None Transfers: Sit to/from Stand Sit to Stand: Min guard         General transfer comment: Min guard for safety with sit to  stand, Min assist provided for balance in standing.     Balance Overall balance assessment: Needs assistance Sitting-balance support: No upper extremity supported;Feet supported Sitting balance-Leahy Scale: Fair Sitting balance - Comments: Initially requiring 1 UE support to maintain sitting balance however at end of EOB activity raised both hands up and was able to maintain for a short time (<5 seconds).   Standing balance support: No upper extremity supported;During functional activity Standing balance-Leahy Scale: Fair                              ADL Overall ADL's : Needs assistance/impaired Eating/Feeding: Set up;Sitting   Grooming: Minimal assistance;Standing;Wash/dry hands Grooming Details (indicate cue type and reason): Assist for balance in standing Upper Body Bathing: Min guard;Sitting   Lower Body Bathing: Moderate assistance;Sit to/from stand   Upper Body Dressing : Minimal assistance;Sitting;Cueing for sequencing Upper Body Dressing Details (indicate cue type and reason): to don hospital gown Lower Body Dressing: Moderate assistance;Sit to/from stand   Toilet Transfer: Minimal assistance;Ambulation;Regular Teacher, adult education Details (indicate cue type and reason): Simulated by transfer from EOB to chair. Pt incontinent PTA but sister reports he was able to clean himself independently. Toileting- Clothing Manipulation and Hygiene: Moderate assistance;Sit to/from stand Toileting - Clothing Manipulation Details (indicate cue type and reason): for toilet hygiene     Functional mobility during ADLs: Minimal assistance General ADL Comments: Pt declining to use AD. Pt able to perform in-room mobility without AD; wide base of support and increased time required.     Vision Vision Assessment?: No  apparent visual deficits   Perception     Praxis      Pertinent Vitals/Pain Pain Assessment: No/denies pain     Hand Dominance Right   Extremity/Trunk  Assessment Upper Extremity Assessment Upper Extremity Assessment: Overall WFL for tasks assessed (slight decrease in gross motor coordination but [facility])   Lower Extremity Assessment Lower Extremity Assessment: Defer to PT evaluation RLE Deficits / Details: BILATERALLY: pt with inconsistent MMT results. Appears to resist therapist initially with strength testing however "lets go" and does not complete test.    Cervical / Trunk Assessment Cervical / Trunk Assessment: Normal   Communication Communication Communication: No difficulties   Cognition Arousal/Alertness: Awake/alert Behavior During Therapy: WFL for tasks assessed/performed Overall Cognitive Status: History of cognitive impairments - at baseline                     General Comments       Exercises       Shoulder Instructions      Home Living Family/patient expects to be discharged to:: Private residence Living Arrangements: Other relatives (sister) Available Help at Discharge: Family;Available 24 hours/day Type of Home: House Home Access: Stairs to enter [employer] of Steps: 2   Home Layout: One level     Bathroom Shower/Tub: Tub/shower unit Shower/tub characteristics: Engineer, building services: Standard Bathroom Accessibility: Yes How Accessible: Accessible via walker Home Equipment: Cane - single point;Shower seat          Prior Functioning/Environment Level of Independence: Needs assistance  Gait / Transfers Assistance Needed: Pt reports he did not use a cane for mobility at home PTA. ADL's / Homemaking Assistance Needed: Sister assisting with getting pt into tub PTA but pt was bathing and dressing. Sister does cooking/cleaning.   Comments: Per RN, pt states he was walking independently at home with a SPC.     OT Diagnosis: Generalized weakness;Cognitive deficits   OT Problem List: Impaired balance (sitting and/or standing);Decreased coordination;Decreased safety awareness;Decreased  knowledge of use of DME or AE;Decreased knowledge of precautions;Obesity   OT Treatment/Interventions: Self-care/ADL training;Energy conservation;DME and/or AE instruction;Therapeutic activities;Patient/family education;Balance training    OT Goals(Current goals can be found in the care plan section) Acute Rehab OT Goals Patient Stated Goal: none stated OT Goal Formulation: With patient/family Time For Goal Achievement: 11/26/15 Potential to Achieve Goals: Good ADL Goals Pt Will Perform Grooming: with modified independence;standing Pt Will Perform Upper Body Bathing: with modified independence;sitting Pt Will Perform Lower Body Bathing: with modified independence;sit to/from stand Pt Will Perform Upper Body Dressing: with modified independence;sitting Pt Will Perform Lower Body Dressing: with modified independence;sit to/from stand Pt Will Perform Tub/Shower Transfer: Tub transfer;with supervision;ambulating;shower seat  OT Frequency: Min 2X/week   Barriers to D/C:            Co-evaluation              End of Session Equipment Utilized During Treatment: Gait belt Nurse Communication: Mobility status;Other (comment) (pt with incontinent episode)  Activity Tolerance: Patient tolerated treatment well Patient left: in chair;with call bell/phone within reach;with chair alarm set;with family/visitor present   Time: 2130-8657 OT Time Calculation (min): 26 min Charges:  OT General Charges $OT Visit: 1 Procedure OT Evaluation $OT Eval Moderate Complexity: 1 Procedure OT Treatments $Self Care/Home Management : 8-22 mins G-Codes: OT G-codes **NOT FOR INPATIENT CLASS** Functional Assessment Tool Used: Clinical judgement Functional Limitation: Self care Self Care Current Status ([id number]): At least 20 percent but less than 40 percent impaired,  limited or restricted Self Care Goal Status [phone]): At least 1 percent but less than 20 percent impaired, limited or restricted   Gaye Alken M.S., OTR/L Pager: [phone]  11/12/2015, 11:43 AM

## 2015-11-12 NOTE — Progress Notes (Signed)
Pt back from MRI 

## 2015-11-12 NOTE — Discharge Summary (Signed)
Name: Dominic Pineda MRN: [id number] DOB: 1950/11/20 65 y.o. PCP: Provider Not In System  Date of Admission: 11/11/2015  5:33 PM Date of Discharge: 11/15/2015 Attending Physician: Burns Spain, MD  Discharge Diagnosis: 1. Acute CVA to Left Corona Radiata   Principal Problem:   Stroke [facility]) Active Problems:   Severe hypertension   Urinary tract infectious disease   Substance abuse   Acute cerebrovascular accident (CVA) (HCC)   Hemiparesis affecting left side as late effect of stroke (HCC)   Bipolar affective disorder in remission (HCC)   Benign essential HTN   History of CVA (cerebrovascular accident)   Acute blood loss anemia   AKI (acute kidney injury) (HCC)  Discharge Medications:   Medication List    STOP taking these medications        simvastatin 10 MG tablet  Commonly known as:  ZOCOR      TAKE these medications        aspirin EC 81 MG tablet  Take 1 tablet (81 mg total) by mouth daily. Take for the next 3 months (April 14 - July 14) with Plavix 75 mg daily.  Then STOP Aspirin and take only Plavix.     atorvastatin 40 MG tablet  Commonly known as:  LIPITOR  Take 1 tablet (40 mg total) by mouth daily at 6 PM.     ciprofloxacin 500 MG tablet  Commonly known as:  CIPRO  Take 1 tablet (500 mg total) by mouth 2 (two) times daily. Last dose 4/20.     clopidogrel 75 MG tablet  Commonly known as:  PLAVIX  Take 1 tablet (75 mg total) by mouth daily.     lisinopril 40 MG tablet  Commonly known as:  PRINIVIL,ZESTRIL  Take 1 tablet (40 mg total) by mouth daily.     senna-docusate 8.6-50 MG tablet  Commonly known as:  Senokot-S  Take 1 tablet by mouth at bedtime as needed for mild constipation.     verapamil 240 MG CR tablet  Commonly known as:  CALAN-SR  Take 1 tablet (240 mg total) by mouth at bedtime.     Vitamin D 2000 units Caps  Take 2,000 Units by mouth daily.        Disposition and follow-up:   Mr.Covey E Jiles was discharged from  [facility] in Stable condition.  At the hospital follow up visit please address:  1.  Medication adherence, BP control, Neurology f/u  2.  Labs / imaging needed at time of follow-up: none  3.  Pending labs/ test needing follow-up: none  Follow-up Appointments: Follow-up Information    Follow up with [name],[name], NP In 2 months.   Specialty:  Family Medicine   Why:  Office will call you with appointment date & time   Contact information:   [address] [place] [address] [phone]       Follow up with [name], MD On 11/29/2015.   Specialty:  Internal Medicine   Why:  10:15a   Contact information:   [address] [place] [address] [phone]       Discharge Instructions: Discharge Instructions    Ambulatory referral to Neurology    Complete by:  As directed   This is a routine stroke follow up. An appointment is requested in 1 month with NP     Call MD for:  difficulty breathing, headache or visual disturbances    Complete by:  As directed  Call MD for:  extreme fatigue    Complete by:  As directed      Call MD for:  persistant dizziness or light-headedness    Complete by:  As directed      Diet - low sodium heart healthy    Complete by:  As directed      Increase activity slowly    Complete by:  As directed            Consultations:    Procedures Performed:  Ct Head Wo Contrast  11/11/2015  CLINICAL DATA:  64 year old male with left-sided arm weakness and slurred speech. Altered mental status. EXAM: CT HEAD WITHOUT CONTRAST TECHNIQUE: Contiguous axial images were obtained from the base of the skull through the vertex without intravenous contrast. COMPARISON:  Head CT 10/30/2011.  MRI of the brain 10/31/2011. FINDINGS: As with the prior examinations there is extensive low attenuation throughout the right MCA distribution, compatible with chronic areas of encephalomalacia. This is very similar to prior study  from 10/30/2011. Moderate cerebral atrophy. No acute intracranial abnormalities. Specifically, no evidence of acute intracranial hemorrhage, no definite findings of acute/subacute cerebral ischemia, no mass, mass effect, hydrocephalus or abnormal intra or extra-axial fluid collections. Visualized paranasal sinuses and mastoids are well pneumatized. No acute displaced skull fractures are identified. IMPRESSION: 1. No acute intracranial abnormalities. 2. 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 3. Moderate cerebral atrophy. Electronically Signed   By: Trudie Reed M.D.   On: 11/11/2015 17:55   [name] Wo Contrast  11/12/2015  CLINICAL DATA:  65 y.o. male history of hypertension, hyperlipidemia, previous stroke, bipolar disorder and cocaine abuse, brought to the emergency room following acute onset of slurred speech and right-sided weakness. EXAM: MRA HEAD WITHOUT CONTRAST TECHNIQUE: Angiographic images of the Circle of Willis were obtained using MRA technique without intravenous contrast. COMPARISON:  MRI brain performed 11/11/2015. FINDINGS: Anterior circulation: Severe stenosis distal cavernous segment RIGHT ICA. Mild irregularity supraclinoid and ICA terminus on the RIGHT. Severe stenosis at the origin of the RIGHT M1 MCA. There is bulbous prominence of the M1 RIGHT MCA distal to the stenosis, favored to represent poststenotic dilatation. Saccular or mycotic aneurysm is felt less likely. Mild irregularity of the distal MCA branches without focal vessel cut off. Normal A1 ACA. At the junction of the RIGHT A1 ACA with the anterior communicating, there is a 3 mm outpouching, possible saccular aneurysm. Diffusely irregular cavernous and supraclinoid ICA on the LEFT. Irregular LEFT A1 ACA. Focal stenosis RIGHT M1 MCA origin, likely flow reducing. Moderately irregular M2 and M3 branches. Posterior circulation:  Basilar is diffusely irregular, with proximal stenosis potentially flow reducing. LEFT vertebral is the sole contributor with diffuse irregularity of its distal V3 and entire V4 segment. RIGHT vertebral nonvisualized. Moderately diseased BILATERAL superior cerebellar arteries. Apparent common AICA and PICA trunk on the RIGHT. LEFT PICA also patent. LEFT AICA diseased. 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 Similar less severe changes on the LEFT. Bulbous prominence of the mid RIGHT M1 MCA, favored to represent poststenotic dilatation. 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 Electronically Signed   By: Elsie Stain M.D.   On: 11/12/2015 10:29   Mr Brain Wo Contrast  11/11/2015  CLINICAL  DATA:  Slurred speech and RIGHT-sided weakness beginning at 1640 hours today. Symptoms now improving. History of hypertension, stroke, cocaine abuse. EXAM: MRI HEAD WITHOUT CONTRAST TECHNIQUE: Multiplanar, multiecho pulse sequences of the brain and surrounding structures were obtained without intravenous contrast. COMPARISON:  CT head November 11, 2015 at 1731 hours and MRI of the brain October 31, 2011 FINDINGS: Mild motion degraded examination. 7 mm focus of reduced diffusion LEFT corona radiata with low ADC value. No susceptibility artifact to suggest hemorrhage. No susceptibility artifact to suggest hemorrhage. RIGHT frontotemporoparietal encephalomalacia with ex vacuo dilatation RIGHT lateral ventricle. Moderate to severe ventriculomegaly on the basis of global parenchymal brain volume loss. Patchy to confluent supratentorial white matter FLAIR T2 hyperintensities exclusive of the aforementioned abnormality. Small area LEFT occipital lobe encephalomalacia. Small bilateral cerebellar infarcts. No  midline shift, mass effect or masses. No abnormal extra-axial fluid collections. No extra-axial masses though, contrast enhanced sequences would be more sensitive. Normal major intracranial vascular flow voids seen at the skull base. Ocular globes and orbital contents are unremarkable though not tailored for evaluation. No abnormal sellar expansion, no pituitary cyst identified on today's examination. No suspicious calvarial bone marrow signal. Craniocervical junction maintained. Visualized paranasal sinuses and mastoid air cells are well-aerated. Multiple suboccipital sebaceous cysts inferred on the diffusion-weighted sequence examination. IMPRESSION: Mildly motion degraded examination. Acute subcentimeter LEFT corona radiata infarct. Old large RIGHT MCA territory infarct. Moderate to severe advanced global brain atrophy for age. Moderate to severe chronic small vessel ischemic disease. Old small cerebellar infarcts. Small area LEFT occipital lobe encephalomalacia may be posttraumatic or ischemic. Electronically Signed   By: Awilda Metroourtnay  Bloomer M.D.   On: 11/11/2015 22:48    2D Echo: Study Conclusions  - Left ventricle: The cavity size was normal. Wall thickness was  increased in a pattern of mild LVH. Systolic function was normal.  The estimated ejection fraction was in the range of 60% to 65%.  Doppler parameters are consistent with abnormal left ventricular  relaxation (grade 1 diastolic dysfunction). - Mitral valve: There was mild regurgitation.  Cardiac Cath:   Admission HPI: Mr. Jyl HeinzChavis is a 65yo with HTN, h/o crack cocaine abuse, multiple CVAs (last 2010) who presents with slurred speech and that earlier this afternoon. History is corroborated by his wife and daughter. He was in his normal state of health until around 4:30pm this afternoon, when he started having slurred speech, right sided facial droop with drooling from the right corner of his mouth, and possibly leaning a bit to his right  side. He says he felt overall 'weak' but denied focal right-sided weakness when further questioned. He also felt 'groggy', was slightly disoriented, had associated blurred vision, and a slight generalized headache. EMS was called and his BP was noted to be 250/130 at that time. En route to the ED, his symptoms began to improve and currently at bedside, he is asymptomatic. He says this feels different than his previous CVAs. He denies fever, recent illness, malaise, syncope, chest pain, palpitations, shortness of breath, N/V/D, urinary symptoms, focal numbness or weakness, or other symptoms at this time. He is a former 18pyh smoker, denies EtOH use (last drank a few months ago), does have a history of crack cocaine but last used 3 months ago, denies IVDU or other drug use at this time.  Hospital Course by problem list: Principal Problem:   Stroke [facility](HCC) Active Problems:   Severe hypertension   Urinary tract infectious disease   Substance abuse   Acute cerebrovascular accident (  CVA) (HCC)   Hemiparesis affecting left side as late effect of stroke (HCC)   Bipolar affective disorder in remission (HCC)   Benign essential HTN   History of CVA (cerebrovascular accident)   Acute blood loss anemia   AKI (acute kidney injury) (HCC)   Acute CVA of the Left Corona Radiata: Patient presented with slurred speech and facial droop.  His symptoms nearly completely resolved with BP control.  His BP medications were increased to Lisinopril 40 mg daily and his home Verapamil 240 mg daily. Neurology recommended secondary stroke prophylaxis with ASA 81 mg and Plavix 75 mg daily for the next 3 months, after which he is to take Plavix alone.  PT evaluation recommended Inpatient Rehab.    Asymptomatic Bacteruria/Complicated Cystitis: Patient presented with U/A concerning for UTI.  Due to being a male, he was treated as complicated cystitis.  He received one dose of CTX IV and was transitioned to Ciprofloxacin 500 mg BID  for a 7 day course (last dose 4/20).  Discharge Vitals:   BP 166/70 mmHg  Pulse 67  Temp(Src) 98.7 F (37.1 C) (Oral)  Resp 18  Ht  (1.778 m)  Wt 280 lb 6.4 oz (127.189 kg)  BMI 40.23 kg/m2  SpO2 97%  Discharge Labs:  Results for orders placed or performed during the hospital encounter of 11/11/15 (from the past 24 hour(s))  Basic metabolic panel     Status: Abnormal   Collection Time: 11/15/15  7:42 AM  Result Value Ref Range   Sodium 140 135 - 145 mmol/L   Potassium 3.6 3.5 - 5.1 mmol/L   Chloride 105 101 - 111 mmol/L   CO2 27 22 - 32 mmol/L   Glucose, Bld 100 (H) 65 - 99 mg/dL   BUN 11 6 - 20 mg/dL   Creatinine, Ser 6.96 0.61 - 1.24 mg/dL   Calcium 8.8 (L) 8.9 - 10.3 mg/dL   GFR calc non Af Amer >60 >60 mL/min   GFR calc Af Amer >60 >60 mL/min   Anion gap 8 5 - 15    Signed: Jana Half, MD 11/15/2015, 12:16 PM    Services Ordered on Discharge: none Equipment Ordered on Discharge: 3N1, rolling walker with 5" wheels

## 2015-11-12 NOTE — Progress Notes (Signed)
STROKE TEAM PROGRESS NOTE   HISTORY OF PRESENT ILLNESS Dominic Pineda is an 65 y.o. male history of hypertension, hyperlipidemia, previous stroke, bipolar disorder and cocaine abuse, brought to the emergency room following acute onset of slurred speech and right-sided weakness. Patient was last known well at 4:40 PM today. Deficits improved since arrival. CT scan of his head was unremarkable. MRI showed an acute subcentimeter left corona radiata infarction. Old right MCA stroke was also noted. He has not been on antiplatelet therapy. NIH stroke score at the time of this evaluation was 0. He was noted to have markedly elevated blood pressure on arrival in the ED which is being managed emergently.   SUBJECTIVE (INTERVAL HISTORY) No major events overnight  OBJECTIVE Temp:  [97.5 F (36.4 C)-98.7 F (37.1 C)] 97.7 F (36.5 C) (04/15 1129) Pulse Rate:  [54-72] 70 (04/15 1129) Cardiac Rhythm:  [-] Normal sinus rhythm (04/15 0700) Resp:  [13-23] 16 (04/15 1129) BP: (111-216)/(61-111) 171/93 mmHg (04/15 1129) SpO2:  [93 %-100 %] 95 % (04/15 1129) Weight:  [127.189 kg (280 lb 6.4 oz)] 127.189 kg (280 lb 6.4 oz) (04/14 2317)  CBC:  Recent Labs Lab 11/11/15 1750 11/11/15 1810 11/12/15 0541  WBC 12.5*  --  14.2*  NEUTROABS 4.9  --   --   HGB 13.8 15.0 12.8*  HCT 40.5 44.0 37.6*  MCV 84.0  --  84.1  PLT 251  --  211    Basic Metabolic Panel:  Recent Labs Lab 11/11/15 1750 11/11/15 1810 11/12/15 0541  NA 137 140 142  K 3.7 3.6 3.3*  CL 101 100* 107  CO2 23  --  23  GLUCOSE 190* 190* 108*  BUN 14 18 12   CREATININE 1.19 1.00 1.01  CALCIUM 8.7*  --  8.7*    Lipid Panel:    Component Value Date/Time   CHOL 209* 11/12/2015 0541   TRIG 93 11/12/2015 0541   HDL 35* 11/12/2015 0541   CHOLHDL 6.0 11/12/2015 0541   VLDL 19 11/12/2015 0541   LDLCALC 155* 11/12/2015 0541   HgbA1c:  Lab Results  Component Value Date   HGBA1C 5.2 10/31/2011   Urine Drug Screen:    Component  Value Date/Time   LABOPIA NONE DETECTED 11/11/2015 1825   COCAINSCRNUR NONE DETECTED 11/11/2015 1825   LABBENZ NONE DETECTED 11/11/2015 1825   AMPHETMU NONE DETECTED 11/11/2015 1825   THCU NONE DETECTED 11/11/2015 1825   LABBARB NONE DETECTED 11/11/2015 1825      IMAGING  Ct Head Wo Contrast 11/11/2015   1. No acute intracranial abnormalities.  2. 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  3. Moderate cerebral atrophy.    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  Similar less severe changes on the LEFT.  Bulbous prominence of the mid RIGHT M1 MCA, favored to represent poststenotic dilatation.  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     Dominic Brain Wo Contrast 11/11/2015   Mildly motion degraded examination. Acute subcentimeter LEFT corona radiata infarct.  Old large RIGHT MCA territory infarct.  Moderate to severe advanced global brain atrophy for age.  Moderate to severe chronic small vessel ischemic disease. Old small cerebellar infarcts.  Small area LEFT occipital lobe encephalomalacia may be posttraumatic or ischemic.     Carotid duplex 11/12/2015 Bilateral: intimal wall thickening CCA. Moderate plaque origin and proximal ICA, L>R. Right: 1-39% ICA stenosis.Left: 40-59% ICA  stenosis. Right vertebral artery flow not insonated secondary to body habitus and breathing interference. Left vertebral artery flow is antegrade.     PHYSICAL EXAM  HEENT- Normocephalic, no lesions, without obvious abnormality. Normal external eye and conjunctiva.Normal external nose, mucus membranes and septum. Normal pharynx. Neck  supple with no masses, nodes, nodules or enlargement. Cardiovascular - regular rate and rhythm, S1, S2 normal, no murmur, click, rub or gallop Lungs - chest clear, no wheezing, rales, normal symmetric air entry Abdomen - soft, non-tender; bowel sounds normal; no masses, no organomegaly Extremities - no joint deformities, effusion, or inflammation and no edema  Neurologic Examination: Mental Status: Alert, oriented, no acute distress. Speech fluent without evidence of aphasia. Able to follow commands without difficulty. Cranial Nerves: II-Visual fields were normal. III/IV/VI-Pupils were equal and reacted normally to light. Extraocular movements were full and conjugate.  V/VII-no facial numbness and no facial weakness. VIII-normal. X-normal speech and symmetrical palatal movement. XI: trapezius strength/neck flexion strength normal bilaterally XII-midline tongue extension with normal strength. Motor: 5/5 bilaterally with normal tone and bulk Sensory: Normal throughout. Deep Tendon Reflexes: 2+ and symmetric. Plantars: Mute bilaterally Cerebellar: Normal finger-to-nose testing.     ASSESSMENT/PLAN Dominic. Dominic Pineda is a 65 y.o. male with history of  hypertension, hyperlipidemia, previous strokes, bipolar disorder and cocaine abuse, presenting with acute onset of slurred speech and right-sided weakness associated with elevated blood pressure. He did not receive IV t-PA due to improvement in deficits.   Stroke:  Dominant infarct felt secondary to severe hypertension.  Resultant    MRI - Acute subcentimeter LEFT corona radiata infarct with remote infarcts as noted above.  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  Carotid Doppler - Left: 40-59% ICA stenosis. Could not evaluate the right vertebral artery.  2D Echo - pending  LDL - 155  HgbA1c pending  VTE prophylaxis - Lovenox  Diet Heart Room service appropriate?: Yes; Fluid  consistency:: Thin  aspirin 81 mg daily prior to admission, now on aspirin 81 mg daily  Patient counseled to be compliant with his antithrombotic medications  Ongoing aggressive stroke risk factor management  Therapy recommendations: CIR recommended  Disposition:  Pending   Hypertension  Elevated blood pressures at times but stable  Permissive hypertension (OK if < 220/120) but gradually normalize in 5-7 days   Hyperlipidemia  Home meds:  Zocor 10 mg daily not resumed in hospital  LDL 155, goal < 70  Now on Lipitor 40 mg daily  Continue statin at discharge   Other Stroke Risk Factors  Advanced age  Cigarette smoker, quit smoking 29 years ago.  ETOH use  Obesity, Body mass index is 40.23 kg/(m^2).   Hx strokes  Hx of cocaine abuse.   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 Documentation reflects findings. The laboratory and radiographic studies reviewed by me. ROS completed by me personally and pertinent positives fully documented  Condition: stable  Assessment and plan completed by me personally and fully documented above. Plans/Recommendations include:  Stroke work-up ongoing  Will follow  SIGNED BY: Dr. Sula Soda     To contact Stroke Continuity provider, please refer to [email or web address]. After hours, contact General Neurology

## 2015-11-12 NOTE — Progress Notes (Signed)
Pt to MRI

## 2015-11-12 NOTE — Evaluation (Signed)
Physical Therapy Evaluation Patient Details Name: Dominic Pineda MRN: [id number] DOB: 1950-10-15 Today's Date: 11/12/2015   History of Present Illness  Pt is a 65 y/o male who presents with slurred speech and R-sided weakness. MRI revealed an acute subcentimeter L corona radiata infarction. PMH includes HTN, hyperlipidemia, previous CVA, bipolar disorder and cocaine abuse.    Clinical Impression  Pt admitted with above diagnosis. Pt currently with functional limitations due to the deficits listed below (see PT Problem List). At the time of PT eval pt requiring min assist for bed level mobility. Pt eager to participate initially however when asked to perform a task pt declined once he attempted the first time. It appeared as if pt refused if the task was difficult. Mobility eval was limited due to transport present to take pt for MRI. Will need to further evaluate transfers and ambulation. Based on current bed mobility level, recommending CIR consult at this time. Pt will benefit from skilled PT to increase their independence and safety with mobility to allow discharge to the venue listed below.       Follow Up Recommendations CIR;Supervision/Assistance - 24 hour    Equipment Recommendations  Rolling walker with 5" wheels;3in1 (PT)    Recommendations for Other Services Rehab consult     Precautions / Restrictions Precautions Precautions: Fall Restrictions Weight Bearing Restrictions: No      Mobility  Bed Mobility Overal bed mobility: Needs Assistance Bed Mobility: Supine to Sit;Sit to Supine     Supine to sit: Min assist Sit to supine: Min assist   General bed mobility comments: Pt was able to transition to/from EOB with min assist for trunk elevation and support. 1 UE support required initially for pt to maintain sitting balance. Increased time required for pt to scoot out to EOB so feet were resting on the floor.   Transfers                 General transfer comment:  Transport present to take pt to MRI and mobility was not able to be progressed at this time.   Ambulation/Gait                Stairs            Wheelchair Mobility    Modified Rankin (Stroke Patients Only) Modified Rankin (Stroke Patients Only) Pre-Morbid Rankin Score: No symptoms Modified Rankin: Moderately severe disability     Balance Overall balance assessment: Needs assistance Sitting-balance support: Feet supported;No upper extremity supported Sitting balance-Leahy Scale: Fair Sitting balance - Comments: Initially requiring 1 UE support to maintain sitting balance however at end of EOB activity raised both hands up and was able to maintain for a short time (<5 seconds).                                     Pertinent Vitals/Pain Pain Assessment: No/denies pain    Home Living Family/patient expects to be discharged to:: Private residence Living Arrangements: Other relatives Available Help at Discharge: Family;Available 24 hours/day (Per pt - family not present to confirm. ) Type of Home: House Home Access: Stairs to enter   Enter[employer] CorporationEntrance Stairs-Number of Steps: 2 Home Layout: One level Home Equipment: Cane - single point      Prior Function Level of Independence: Independent with assistive device(s)         Comments: Per RN, pt states he was walking independently at  home with a SPC.      Hand Dominance        Extremity/Trunk Assessment   Upper Extremity Assessment: Defer to OT evaluation           Lower Extremity Assessment: Generalized weakness;RLE deficits/detail RLE Deficits / Details: BILATERALLY: pt with inconsistent MMT results. Appears to resist therapist initially with strength testing however "lets go" and does not complete test.     Cervical / Trunk Assessment: Normal  Communication   Communication: No difficulties  Cognition Arousal/Alertness: Awake/alert Behavior During Therapy: WFL for tasks  assessed/performed Overall Cognitive Status: No family/caregiver present to determine baseline cognitive functioning                      General Comments      Exercises        Assessment/Plan    PT Assessment Patient needs continued PT services  PT Diagnosis Difficulty walking;Abnormality of gait   PT Problem List Decreased strength;Decreased activity tolerance;Decreased range of motion;Decreased balance;Decreased mobility;Decreased coordination;Decreased knowledge of use of DME;Decreased knowledge of precautions;Decreased safety awareness  PT Treatment Interventions DME instruction;Gait training;Stair training;Functional mobility training;Therapeutic activities;Therapeutic exercise;Neuromuscular re-education;Patient/family education   PT Goals (Current goals can be found in the Care Plan section) Acute Rehab PT Goals Patient Stated Goal: Home with family PT Goal Formulation: With patient Time For Goal Achievement: 11/19/15 Potential to Achieve Goals: Good    Frequency Min 4X/week   Barriers to discharge        Co-evaluation               End of Session   Activity Tolerance: Patient tolerated treatment well Patient left: in bed;with call bell/phone within reach (Transport present to take pt to MRI) Nurse Communication: Mobility status    Functional Assessment Tool Used: Clinical judgement Functional Limitation: Mobility: Walking and moving around Mobility: Walking and Moving Around Current Status (X9147): At least 20 percent but less than 40 percent impaired, limited or restricted Mobility: Walking and Moving Around Goal Status [phone]): At least 1 percent but less than 20 percent impaired, limited or restricted    Time: 0806-0818 PT Time Calculation (min) (ACUTE ONLY): 12 min   Charges:   PT Evaluation $PT Eval Moderate Complexity: 1 Procedure     PT G Codes:   PT G-Codes **NOT FOR INPATIENT CLASS** Functional Assessment Tool Used: Clinical  judgement Functional Limitation: Mobility: Walking and moving around Mobility: Walking and Moving Around Current Status (O1308): At least 20 percent but less than 40 percent impaired, limited or restricted Mobility: Walking and Moving Around Goal Status [phone]): At least 1 percent but less than 20 percent impaired, limited or restricted    Conni Slipper 11/12/2015, 10:31 AM   Conni Slipper, PT, DPT Acute Rehabilitation Services Pager: [phone]

## 2015-11-12 NOTE — Progress Notes (Signed)
OT Cancellation Note  Patient Details Name: Dominic Pineda MRN: [id number] DOB: [birth year]   Cancelled Treatment:    Reason Eval/Treat Not Completed: Patient at procedure or test/ unavailable. Will follow up for OT eval as time allows.  Gaye AlkenBailey A Jalil Lorusso M.S., OTR/L Pager: [phone]  11/12/2015, 9:04 AM

## 2015-11-13 DIAGNOSIS — I6350 Cerebral infarction due to unspecified occlusion or stenosis of unspecified cerebral artery: Secondary | ICD-10-CM

## 2015-11-13 LAB — HIV ANTIBODY (ROUTINE TESTING W REFLEX): HIV Screen 4th Generation wRfx: NONREACTIVE

## 2015-11-13 LAB — URINE CULTURE: Special Requests: NORMAL

## 2015-11-13 LAB — HEPATITIS C ANTIBODY: HCV Ab: <0.1 {s_co_ratio} (ref 0.0–0.9)

## 2015-11-13 MED ORDER — CLOPIDOGREL BISULFATE 75 MG PO TABS
75.0000 mg | ORAL_TABLET | Freq: Every day | ORAL | Status: DC
  Administered 2015-11-13 – 2015-11-15 (×3): 75 mg via ORAL
  Filled 2015-11-13 (×3): qty 1

## 2015-11-13 MED ORDER — LISINOPRIL 20 MG PO TABS
20.0000 mg | ORAL_TABLET | Freq: Every day | ORAL | Status: DC
  Administered 2015-11-13: 20 mg via ORAL
  Filled 2015-11-13: qty 1

## 2015-11-13 NOTE — Progress Notes (Signed)
Subjective: No acute events overnight. Patient denies any new weakness, changes in vision, or numbness/tingling.   Objective: Vital signs in last 24 hours: Filed Vitals:   11/12/15 2100 11/12/15 2351 11/13/15 0145 11/13/15 0700  BP: 219/91 213/86 190/90 199/93  Pulse: 68  67 78  Temp: 97.7 F (36.5 C)  98 F (36.7 C) 98.1 F (36.7 C)  TempSrc: Oral  Oral Oral  Resp: 18  20 18   Height:      Weight:      SpO2: 98%  98% 97%   Weight change:   Intake/Output Summary (Last 24 hours) at 11/13/15 0723 Last data filed at 11/13/15 0700  Gross per 24 hour  Intake    240 ml  Output   1000 ml  Net   -760 ml   Physical Exam  Constitutional: He is oriented to person, place, and time.  Morbidly obese male, lying in bed, conversant, NAD.  HENT:  Head: Normocephalic and atraumatic.  Eyes: EOM are normal.  Neck: No tracheal deviation present.  Cardiovascular: Normal rate and regular rhythm.   Heart sounds distant.  No murmurs appreciated.  Pulmonary/Chest: Effort normal. No stridor. No respiratory distress. He has no wheezes.  Minimal dry crackles in bilateral bases.  No wheezes.  Abdominal: Soft. There is no rebound and no guarding.  Obese. No tenderness today.  Musculoskeletal: He exhibits no edema.  Neurological: He is alert and oriented to person, place, and time.  Skin: Skin is warm and dry.    Lab Results: Basic Metabolic Panel:  Recent Labs Lab 11/11/15 1750 11/11/15 1810 11/12/15 0541  NA 137 140 142  K 3.7 3.6 3.3*  CL 101 100* 107  CO2 23  --  23  GLUCOSE 190* 190* 108*  BUN 14 18 12   CREATININE 1.19 1.00 1.01  CALCIUM 8.7*  --  8.7*   Liver Function Tests:  Recent Labs Lab 11/11/15 1750 11/12/15 0541  AST 29 10*  ALT 11* 8*  ALKPHOS 105 96  BILITOT 1.7* 1.0  PROT 6.7 6.5  ALBUMIN 3.5 3.2*   CBC:  Recent Labs Lab 11/11/15 1750 11/11/15 1810 11/12/15 0541  WBC 12.5*  --  14.2*  NEUTROABS 4.9  --   --   HGB 13.8 15.0 12.8*  HCT 40.5 44.0  37.6*  MCV 84.0  --  84.1  PLT 251  --  211   CBG:  Recent Labs Lab 11/11/15 1742  GLUCAP 181*   Fasting Lipid Panel:  Recent Labs Lab 11/12/15 0541  CHOL 209*  HDL 35*  LDLCALC 155*  TRIG 93  CHOLHDL 6.0   Coagulation:  Recent Labs Lab 11/11/15 1750  LABPROT 14.6  INR 1.12   Urine Drug Screen: Drugs of Abuse     Component Value Date/Time   LABOPIA NONE DETECTED 11/11/2015 1825   COCAINSCRNUR NONE DETECTED 11/11/2015 1825   LABBENZ NONE DETECTED 11/11/2015 1825   AMPHETMU NONE DETECTED 11/11/2015 1825   THCU NONE DETECTED 11/11/2015 1825   LABBARB NONE DETECTED 11/11/2015 1825    Alcohol Level:  Recent Labs Lab 11/11/15 1750  ETH <5   Urinalysis:  Recent Labs Lab 11/11/15 1825  COLORURINE AMBER*  LABSPEC 1.021  PHURINE 5.5  GLUCOSEU NEGATIVE  HGBUR SMALL*  BILIRUBINUR NEGATIVE  KETONESUR NEGATIVE  PROTEINUR NEGATIVE  NITRITE POSITIVE*  LEUKOCYTESUR LARGE*   Misc. Labs:   Micro Results: Recent Results (from the past 240 hour(s))  Urine culture     Status: None (Preliminary result)  Collection Time: 11/11/15  8:46 PM  Result Value Ref Range Status   Specimen Description URINE, CLEAN CATCH  Final   Special Requests Normal  Final   Culture TOO YOUNG TO READ  Final   Report Status PENDING  Incomplete  MRSA PCR Screening     Status: None   Collection Time: 11/12/15  2:11 PM  Result Value Ref Range Status   MRSA by PCR NEGATIVE NEGATIVE Final    Comment:        The GeneXpert MRSA Assay (FDA approved for NASAL specimens only), is one component of a comprehensive MRSA colonization surveillance program. It is not intended to diagnose MRSA infection nor to guide or monitor treatment for MRSA infections.    Studies/Results: Ct Head Wo Contrast  11/11/2015  CLINICAL DATA:  65 year old male with left-sided arm weakness and slurred speech. Altered mental status. EXAM: CT HEAD WITHOUT CONTRAST TECHNIQUE: Contiguous axial images were  obtained from the base of the skull through the vertex without intravenous contrast. COMPARISON:  Head CT 10/30/2011.  MRI of the brain 10/31/2011. FINDINGS: As with the prior examinations there is extensive low attenuation throughout the right MCA distribution, compatible with chronic areas of encephalomalacia. This is very similar to prior study from 10/30/2011. Moderate cerebral atrophy. No acute intracranial abnormalities. Specifically, no evidence of acute intracranial hemorrhage, no definite findings of acute/subacute cerebral ischemia, no mass, mass effect, hydrocephalus or abnormal intra or extra-axial fluid collections. Visualized paranasal sinuses and mastoids are well pneumatized. No acute displaced skull fractures are identified. IMPRESSION: 1. No acute intracranial abnormalities. 2. 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 3. Moderate cerebral atrophy. Electronically Signed   By: Trudie Reed M.D.   On: 11/11/2015 17:55   [name] Wo Contrast  11/12/2015  CLINICAL DATA:  65 y.o. male history of hypertension, hyperlipidemia, previous stroke, bipolar disorder and cocaine abuse, brought to the emergency room following acute onset of slurred speech and right-sided weakness. EXAM: MRA HEAD WITHOUT CONTRAST TECHNIQUE: Angiographic images of the Circle of Willis were obtained using MRA technique without intravenous contrast. COMPARISON:  MRI brain performed 11/11/2015. FINDINGS: Anterior circulation: Severe stenosis distal cavernous segment RIGHT ICA. Mild irregularity supraclinoid and ICA terminus on the RIGHT. Severe stenosis at the origin of the RIGHT M1 MCA. There is bulbous prominence of the M1 RIGHT MCA distal to the stenosis, favored to represent poststenotic dilatation. Saccular or mycotic aneurysm is felt less likely. Mild irregularity of the distal MCA branches without focal vessel cut off.  Normal A1 ACA. At the junction of the RIGHT A1 ACA with the anterior communicating, there is a 3 mm outpouching, possible saccular aneurysm. Diffusely irregular cavernous and supraclinoid ICA on the LEFT. Irregular LEFT A1 ACA. Focal stenosis RIGHT M1 MCA origin, likely flow reducing. Moderately irregular M2 and M3 branches. Posterior circulation: Basilar is diffusely irregular, with proximal stenosis potentially flow reducing. LEFT vertebral is the sole contributor with diffuse irregularity of its distal V3 and entire V4 segment. RIGHT vertebral nonvisualized. Moderately diseased BILATERAL superior cerebellar arteries. Apparent common AICA and PICA trunk on the RIGHT. LEFT PICA also patent. LEFT AICA diseased. 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 Similar less severe changes on the LEFT. Bulbous prominence of the mid RIGHT M1 MCA, favored to represent poststenotic dilatation.  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 Electronically Signed   By: Elsie Stain M.D.   On: 11/12/2015 10:29   Mr Brain Wo Contrast  11/11/2015  CLINICAL DATA:  Slurred speech and RIGHT-sided weakness beginning at 1640 hours today. Symptoms now improving. History of hypertension, stroke, cocaine abuse. EXAM: MRI HEAD WITHOUT CONTRAST TECHNIQUE: Multiplanar, multiecho pulse sequences of the brain and surrounding structures were obtained without intravenous contrast. COMPARISON:  CT head November 11, 2015 at 1731 hours and MRI of the brain October 31, 2011 FINDINGS: Mild motion degraded examination. 7 mm focus of reduced diffusion LEFT corona radiata with low ADC value. No susceptibility artifact to suggest hemorrhage. No susceptibility artifact to suggest hemorrhage. RIGHT frontotemporoparietal encephalomalacia  with ex vacuo dilatation RIGHT lateral ventricle. Moderate to severe ventriculomegaly on the basis of global parenchymal brain volume loss. Patchy to confluent supratentorial white matter FLAIR T2 hyperintensities exclusive of the aforementioned abnormality. Small area LEFT occipital lobe encephalomalacia. Small bilateral cerebellar infarcts. No midline shift, mass effect or masses. No abnormal extra-axial fluid collections. No extra-axial masses though, contrast enhanced sequences would be more sensitive. Normal major intracranial vascular flow voids seen at the skull base. Ocular globes and orbital contents are unremarkable though not tailored for evaluation. No abnormal sellar expansion, no pituitary cyst identified on today's examination. No suspicious calvarial bone marrow signal. Craniocervical junction maintained. Visualized paranasal sinuses and mastoid air cells are well-aerated. Multiple suboccipital sebaceous cysts inferred on the diffusion-weighted sequence examination. IMPRESSION: Mildly motion degraded examination. Acute subcentimeter LEFT corona radiata infarct. Old large RIGHT MCA territory infarct. Moderate to severe advanced global brain atrophy for age. Moderate to severe chronic small vessel ischemic disease. Old small cerebellar infarcts. Small area LEFT occipital lobe encephalomalacia may be posttraumatic or ischemic. Electronically Signed   By: Awilda Metro M.D.   On: 11/11/2015 22:48   Medications: I have reviewed the patient's current medications. Scheduled Meds: . aspirin EC  81 mg Oral Daily  . atorvastatin  40 mg Oral q1800  . cholecalciferol  2,000 Units Oral Daily  . ciprofloxacin  500 mg Oral BID  . enoxaparin (LOVENOX) injection  40 mg Subcutaneous Q24H   Continuous Infusions:  PRN Meds:.acetaminophen **OR** acetaminophen, hydrALAZINE, senna-docusate Assessment/Plan:  Mr. Petz is a 65yo with HTN, h/o crack cocaine abuse, multiple CVAs (last 2010) who presents with  slurred speech and that earlier this afternoon.  Acute CVA of L corona radiata: Patient presented with transient focal neurologic symptoms in the setting of poorly-controlled HTN and multiple CVAs. UDS here negative. Patient reports compliance with his meds. Possible faint residual symptoms, otherwise resolved currently.  CT without new findings, with old R MCA encephalomalacia. MRI confirms acute CVA of L corona radiata.  Carotid US shows 1-39% on the right and 40-59%on the left. Echo with EF 60-65%, mild LVH, grade 1 diastolic dysfunction. MRA with diffuse intracranial atherosclerotic changes likely related to his drug abuse and stenoses in the right anterior circulation as noted above. I spoke with Neurology and we agree that the patient would benefit from both ASA and plavix for secondary prevention. PT/OT recommending CIR, consult pending.  - Neurology following, appreciate recommendations - Out of 48 hour window now for permissive HTN, will restart home Lisinopril 20 mg daily  - Add Plavix 75 mg daily - Continue Atorvastatin 40 mg - A1c pending  - Continue ASA 81 mg - PT/OT/SLP recs greatly appreciated -Telemetry  Acute cystitis: Patient with  mild leukocytosis and UA suggestive of UTI without symptoms. He is s/p 1g CTX in the ED.  Given he is male, we will treat as complicated cystitis.   - UCx pending  - Ciprofloxacin 500 mg BID  FEN/GI: HH DVT Ppx: Lovenox SQ Dispo: Discharge to CIR possibly today or tomorrow   The patient does not have a current PCP (Provider Not In System) and does need an [facility] hospital follow-up appointment after discharge.  The patient does not have transportation limitations that hinder transportation to clinic appointments.  .Services Needed at time of discharge: Y = Yes, Blank = No PT:   OT:   RN:   Equipment:   Other:     LOS: 1 day   Su Hoff, MD 11/13/2015, 7:23 AM

## 2015-11-13 NOTE — Progress Notes (Signed)
Agree with adding Plavix to aspirin for severe cerebrovascular disease. Would continue aspirin for 3 months and then Plavix alone.  Delton Seeavid Rachel Rison PA-C [facility] Pager [phone] 11/13/2015, 3:25 PM

## 2015-11-13 NOTE — Progress Notes (Signed)
STROKE TEAM PROGRESS NOTE   HISTORY OF PRESENT ILLNESS Dominic Pineda is an 65 y.o. male history of hypertension, hyperlipidemia, previous stroke, bipolar disorder and cocaine abuse, brought to the emergency room following acute onset of slurred speech and right-sided weakness. Patient was last known well at 4:40 PM today. Deficits improved since arrival. CT scan of his head was unremarkable. MRI showed an acute subcentimeter left corona radiata infarction. Old right MCA stroke was also noted. He has not been on antiplatelet therapy. NIH stroke score at the time of this evaluation was 0. He was noted to have markedly elevated blood pressure on arrival in the ED which is being managed emergently.   SUBJECTIVE (INTERVAL HISTORY)    OBJECTIVE Temp:  [97.5 F (36.4 C)-98.4 F (36.9 C)] 98.1 F (36.7 C) (04/16 0700) Pulse Rate:  [62-78] 78 (04/16 0700) Cardiac Rhythm:  [-] Normal sinus rhythm (04/16 0700) Resp:  [16-20] 18 (04/16 0700) BP: (171-219)/(76-93) 199/93 mmHg (04/16 0700) SpO2:  [95 %-98 %] 97 % (04/16 0700)  CBC:   Recent Labs Lab 11/11/15 1750 11/11/15 1810 11/12/15 0541  WBC 12.5*  --  14.2*  NEUTROABS 4.9  --   --   HGB 13.8 15.0 12.8*  HCT 40.5 44.0 37.6*  MCV 84.0  --  84.1  PLT 251  --  211    Basic Metabolic Panel:   Recent Labs Lab 11/11/15 1750 11/11/15 1810 11/12/15 0541  NA 137 140 142  K 3.7 3.6 3.3*  CL 101 100* 107  CO2 23  --  23  GLUCOSE 190* 190* 108*  BUN CREATININE 1.19 1.00 1.01  CALCIUM 8.7*  --  8.7*    Lipid Panel:     Component Value Date/Time   CHOL 209* 11/12/2015 0541   TRIG 93 11/12/2015 0541   HDL 35* 11/12/2015 0541   CHOLHDL 6.0 11/12/2015 0541   VLDL 19 11/12/2015 0541   LDLCALC 155* 11/12/2015 0541   HgbA1c:  Lab Results  Component Value Date   HGBA1C 5.2 10/31/2011   Urine Drug Screen:     Component Value Date/Time   LABOPIA NONE DETECTED 11/11/2015 1825   COCAINSCRNUR NONE DETECTED 11/11/2015  1825   LABBENZ NONE DETECTED 11/11/2015 1825   AMPHETMU NONE DETECTED 11/11/2015 1825   THCU NONE DETECTED 11/11/2015 1825   LABBARB NONE DETECTED 11/11/2015 1825      IMAGING  Ct Head Wo Contrast 11/11/2015   1. No acute intracranial abnormalities.  2. 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  3. Moderate cerebral atrophy.    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  Similar less severe changes on the LEFT.  Bulbous prominence of the mid RIGHT M1 MCA, favored to represent poststenotic dilatation.  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     Mr Brain Wo Contrast 11/11/2015   Mildly motion degraded examination. Acute subcentimeter LEFT corona radiata infarct.  Old large RIGHT MCA territory infarct.  Moderate to severe advanced global brain atrophy for age.  Moderate to severe chronic small vessel ischemic disease. Old small cerebellar infarcts.  Small area LEFT occipital lobe encephalomalacia may be posttraumatic or ischemic.  Carotid duplex 11/12/2015 Bilateral: intimal wall thickening CCA. Moderate plaque origin and proximal ICA, L>R. Right: 1-39% ICA stenosis.Left: 40-59% ICA  stenosis. Right vertebral artery flow not insonated secondary to body habitus and breathing interference. Left vertebral artery flow is antegrade.     PHYSICAL EXAM Neurologic Examination: Mental Status: Alert, oriented, no acute distress. Speech fluent without evidence of aphasia. Able to follow commands without difficulty. Cranial Nerves: II-Visual fields were normal. III/IV/VI-Pupils were equal and reacted normally to light.  Extraocular movements were full and conjugate.  V/VII-no facial numbness and no facial weakness. VIII-normal. X-normal speech and symmetrical palatal movement. XI: trapezius strength/neck flexion strength normal bilaterally XII-midline tongue extension with normal strength. Motor: 5/5 bilaterally with normal tone and bulk Sensory: Normal throughout. Deep Tendon Reflexes: 2+ and symmetric. Plantars: Mute bilaterally Cerebellar: Normal finger-to-nose testing.       ASSESSMENT/PLAN [name] is a 65 y.o. male with history of  hypertension, hyperlipidemia, previous strokes, bipolar disorder and cocaine abuse, presenting with acute onset of slurred speech and right-sided weakness associated with elevated blood pressure. He did not receive IV t-PA due to improvement in deficits.   Stroke:  Dominant infarct felt secondary to severe hypertension.  Resultant    MRI - Acute subcentimeter LEFT corona radiata infarct with remote infarcts as noted above.  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  Carotid Doppler - Left: 40-59% ICA stenosis. Could not evaluate the right vertebral artery.  2D Echo - pending  LDL - 155  HgbA1c pending  VTE prophylaxis - Lovenox Diet Heart Room service appropriate?: Yes; Fluid consistency:: Thin  aspirin 81 mg daily prior to admission, now on aspirin 81 mg daily  Patient counseled to be compliant with his antithrombotic medications  Ongoing aggressive stroke risk factor management  Therapy recommendations: CIR recommended  Disposition:  Pending   Hypertension  Elevated blood pressures at times but stable  Permissive hypertension (OK if < 220/120) but gradually normalize in 5-7 days   Hyperlipidemia  Home meds:  Zocor 10 mg daily not resumed in hospital  LDL 155, goal < 70  Now on Lipitor 40 mg daily  Continue statin at discharge   Other Stroke Risk Factors  Advanced  age  Cigarette smoker, quit smoking 29 years ago.  ETOH use  Obesity, Body mass index is 40.23 kg/(m^2).   Hx strokes  Hx of cocaine abuse.   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 After hours, contact General Neurology

## 2015-11-14 DIAGNOSIS — D62 Acute posthemorrhagic anemia: Secondary | ICD-10-CM

## 2015-11-14 DIAGNOSIS — I639 Cerebral infarction, unspecified: Principal | ICD-10-CM

## 2015-11-14 DIAGNOSIS — I69354 Hemiplegia and hemiparesis following cerebral infarction affecting left non-dominant side: Secondary | ICD-10-CM

## 2015-11-14 DIAGNOSIS — F3170 Bipolar disorder, currently in remission, most recent episode unspecified: Secondary | ICD-10-CM

## 2015-11-14 DIAGNOSIS — Z8673 Personal history of transient ischemic attack (TIA), and cerebral infarction without residual deficits: Secondary | ICD-10-CM | POA: Insufficient documentation

## 2015-11-14 DIAGNOSIS — I10 Essential (primary) hypertension: Secondary | ICD-10-CM | POA: Insufficient documentation

## 2015-11-14 DIAGNOSIS — N179 Acute kidney failure, unspecified: Secondary | ICD-10-CM | POA: Insufficient documentation

## 2015-11-14 LAB — PATHOLOGIST SMEAR REVIEW

## 2015-11-14 LAB — HEMOGLOBIN A1C
Hgb A1c MFr Bld: 5.5 % (ref 4.8–5.6)
MEAN PLASMA GLUCOSE: 111 mg/dL

## 2015-11-14 LAB — BASIC METABOLIC PANEL
Anion gap: 8 (ref 5–15)
BUN: 13 mg/dL (ref 6–20)
CALCIUM: 8.8 mg/dL — AB (ref 8.9–10.3)
CO2: 29 mmol/L (ref 22–32)
Chloride: 102 mmol/L (ref 101–111)
Creatinine, Ser: 1.24 mg/dL (ref 0.61–1.24)
GFR calc Af Amer: >60 mL/min (ref >60)
GFR, EST NON AFRICAN AMERICAN: 59 mL/min — AB (ref >60)
Glucose, Bld: 98 mg/dL (ref 65–99)
Potassium: 3.7 mmol/L (ref 3.5–5.1)
SODIUM: 139 mmol/L (ref 135–145)

## 2015-11-14 MED ORDER — LISINOPRIL 20 MG PO TABS
40.0000 mg | ORAL_TABLET | Freq: Every day | ORAL | Status: DC
  Administered 2015-11-14 – 2015-11-15 (×2): 40 mg via ORAL
  Filled 2015-11-14 (×2): qty 2

## 2015-11-14 NOTE — Progress Notes (Signed)
Inpatient Rehabilitation  We received a prescreen request from PT/OT  for possible IP Rehab admission.  At this time we are recommending an IP Rehab consult.  If pt's VA is his only medical benefits, VA will not authorize an IP Rehab admission, however it appears that pt. may also have [facility] benefits. [employer] Medicare may authorize IP Rehab admission. Please call if questions.  Weldon PickingSusan Slayden Mennenga PT Inpatient Rehab Admissions Coordinator Cell [phone] Office [phone]

## 2015-11-14 NOTE — Progress Notes (Signed)
rn went in to assess pt after PT told rn about pts comment about window.  Pt denies SI, wanting to hurt himself, kill himself, or being depression. Denies HI, AH/VH. Pt unsure why he had the thought. States he thought " I could go over to the window, then I could jump out of it". Pt denies wanting to jump out of the window. Pt said he had the thought for 1 minute, but not longer than that. Has not had any more thoughts about jumping out of a window since then.   Pt asked if he had a hx of depression or SI attempts. Denies SI attempts. Reports he was depressed around 15 years ago. When rn asked why pt was depressed, pt stated that he was depressed because a girlfriend had an abortion.   Pt contracts for safety.

## 2015-11-14 NOTE — Progress Notes (Signed)
Condom cath came off again, rn replaced again.

## 2015-11-14 NOTE — Care Management Note (Signed)
Case Management Note  Patient Details  Name: Wyonia HoughRonald E Alcock MRN: [id number] Date of Birth: 1950/08/11  Subjective/Objective:     Patient admitted with CVA. He is from home with his sister. Pt is 100 % [place] services [place] and [facility].  Per CIR note he also may have [facility] Medicare so may be able to qualify for CIR. Pt also has primary MD with the [facility]: Dr. Fredric Dinekwubunka-Anyim.              Action/Plan: Awaiting insurance information for possible CIR vs [place] the [place]. CM continuing to follow for discharge needs.  Expected Discharge Date:                  Expected Discharge Plan:  IP Rehab Facility  In-House Referral:     Discharge planning Services     Post Acute Care Choice:    Choice offered to:     DME Arranged:    DME Agency:     HH Arranged:    HH Agency:     Status of Service:  In process, will continue to follow  Medicare Important Message Given:    Date Medicare IM Given:    Medicare IM give by:    Date Additional Medicare IM Given:    Additional Medicare Important Message give by:     If discussed at Long Length of Stay Meetings, dates discussed:    Additional Comments:  Kermit BaloKelli F Carmeline Kowal, RN 11/14/2015, 10:45 AM

## 2015-11-14 NOTE — Progress Notes (Signed)
Condom cath came off, rn applied another condom cath.

## 2015-11-14 NOTE — Progress Notes (Signed)
Rehab admissions - I am following for potential acute inpatient rehab admission.  Awaiting further PT so that we can determine if patient would be a good candidate for inpatient rehab.  I will follow up again tomorrow.  Call me for questions.  #[phone]

## 2015-11-14 NOTE — Progress Notes (Signed)
STROKE TEAM PROGRESS NOTE   SUBJECTIVE (INTERVAL HISTORY) No family present. Patient lying flat in bed. No complaints. States he has been eating a lot of honey buns recently and he plans to cut back.    OBJECTIVE Temp:  [97.3 F (36.3 C)-98.9 F (37.2 C)] 98.9 F (37.2 C) (04/17 1041) Pulse Rate:  [67-83] 70 (04/17 1041) Cardiac Rhythm:  [-] Normal sinus rhythm (04/17 0704) Resp:  [16-20] 18 (04/17 1041) BP: (164-204)/(74-97) 164/74 mmHg (04/17 1041) SpO2:  [93 %-100 %] 97 % (04/17 1041)  CBC:   Recent Labs Lab 11/11/15 1750 11/11/15 1810 11/12/15 0541  WBC 12.5*  --  14.2*  NEUTROABS 4.9  --   --   HGB 13.8 15.0 12.8*  HCT 40.5 44.0 37.6*  MCV 84.0  --  84.1  PLT 251  --  211   Basic Metabolic Panel:   Recent Labs Lab 11/12/15 0541 11/14/15 0707  NA 142 139  K 3.3* 3.7  CL 107 102  CO2 23 29  GLUCOSE 108* 98  BUN 12 13  CREATININE 1.01 1.24  CALCIUM 8.7* 8.8*   Lipid Panel:     Component Value Date/Time   CHOL 209* 11/12/2015 0541   TRIG 93 11/12/2015 0541   HDL 35* 11/12/2015 0541   CHOLHDL 6.0 11/12/2015 0541   VLDL 19 11/12/2015 0541   LDLCALC 155* 11/12/2015 0541   HgbA1c:  Lab Results  Component Value Date   HGBA1C 5.5 11/12/2015   Urine Drug Screen:     Component Value Date/Time   LABOPIA NONE DETECTED 11/11/2015 1825   COCAINSCRNUR NONE DETECTED 11/11/2015 1825   LABBENZ NONE DETECTED 11/11/2015 1825   AMPHETMU NONE DETECTED 11/11/2015 1825   THCU NONE DETECTED 11/11/2015 1825   LABBARB NONE DETECTED 11/11/2015 1825     IMAGING  Ct Head Wo Contrast 11/11/2015   1. No acute intracranial abnormalities.  2. 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  3. Moderate cerebral atrophy.   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  Severe tandem stenoses in the  RIGHT anterior circulation involving the distal cavernous RIGHT ICA and proximal RIGHT MCA.  Similar less severe changes on the LEFT.  Bulbous prominence of the mid RIGHT M1 MCA, favored to represent poststenotic dilatation.  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   Mr Brain Wo Contrast 11/11/2015   Mildly motion degraded examination. Acute subcentimeter LEFT corona radiata infarct.  Old large RIGHT MCA territory infarct.  Moderate to severe advanced global brain atrophy for age.  Moderate to severe chronic small vessel ischemic disease. Old small cerebellar infarcts.  Small area LEFT occipital lobe encephalomalacia may be posttraumatic or ischemic.   Carotid duplex 11/12/2015 Bilateral: intimal wall thickening CCA. Moderate plaque origin and proximal ICA, L>R. Right: 1-39% ICA stenosis.Left: 40-59% ICA  stenosis. Right vertebral artery flow not insonated secondary to body habitus and breathing interference. Left vertebral artery flow is antegrade.   2D Echocardiogram  - Left ventricle: The cavity size was normal. Wall thickness was increased in a pattern of mild LVH. Systolic function was normal. The estimated ejection fraction was in the range of 60% to 65%. Doppler parameters are consistent with abnormal left ventricular relaxation (grade 1 diastolic dysfunction). - Mitral valve: There was mild regurgitation.   PHYSICAL EXAM Neurologic  Examination: Mental Status: Alert, oriented, no acute distress. Speech fluent without evidence of aphasia. Able to follow commands without difficulty. Cranial Nerves: II-Visual fields were normal. III/IV/VI-Pupils were equal and reacted normally to light. Extraocular movements were full and conjugate.  V/VII-no facial numbness and no facial weakness. VIII-normal. X-normal speech and symmetrical palatal movement. XI: trapezius strength/neck flexion strength normal  bilaterally XII-midline tongue extension with normal strength. Motor: 5/5 bilaterally with normal tone and bulk Sensory: Normal throughout. Deep Tendon Reflexes: 2+ and symmetric. Plantars: Mute bilaterally Cerebellar: Normal finger-to-nose testing.   ASSESSMENT/PLAN [name] is a 65 y.o. male with history of  hypertension, hyperlipidemia, previous strokes, bipolar disorder and cocaine abuse, presenting with acute onset of slurred speech and right-sided weakness associated with elevated blood pressure. He did not receive IV t-PA due to improvement in deficits.   Stroke:  Dominant L corona radiata infarct felt secondary to small vessel disease from severe hypertension.  Resultant    MRI - Acute subcentimeter LEFT corona radiata infarct with remote infarcts as noted above.  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  Carotid Doppler - Left: 40-59% ICA stenosis. Could not evaluate the right vertebral artery.  2D Echo - No source of embolus   LDL - 155  HgbA1c 5.2  VTE prophylaxis - Lovenox Diet Heart Room service appropriate?: Yes; Fluid consistency:: Thin  aspirin 81 mg daily ordered but he was not taking prior to admission, now on aspirin 81 mg daily. Continue aspirin at discharge  Patient counseled to be compliant with his antithrombotic medications  Ongoing aggressive stroke risk factor management  Therapy recommendations: CIR recommended  Disposition:  Pending  Follow up with Dr. Marlis Edelson NP in 2 months. Order written. Will sign off.  Hypertension  Elevated blood pressures at times but stable  Permissive hypertension (OK if < 220/120) but gradually normalize in 5-7 days  Hyperlipidemia  Home meds:  Zocor 10 mg daily not resumed in hospital  LDL 155, goal < 70  Now on Lipitor 40 mg daily  Continue statin at discharge  Other Stroke Risk Factors  Advanced age  Cigarette smoker, quit smoking 29 years  ago.  ETOH use  Morbid Obesity, Body mass index is 40.23 kg/(m^2).   Hx strokes  Hx of cocaine abuse.   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 I have made any additions or clarifications directly to the above note. Agree with note above. Transfer to inpatient rehabilitation when bed available  Delia Heady, MD Medical Director [facility] Pager: [phone] 11/14/2015 2:05 PM  To contact Stroke Continuity provider, please refer to [email or web address]. After hours, contact General Neurology

## 2015-11-14 NOTE — Progress Notes (Signed)
Patient ID: Wyonia HoughRonald E Pineda, male   DOB: 1950/08/25, 65 y.o.   MRN: [id number]  Nurse Faith-Marie notified me that [name] had mentioned to the physical therapist he wanted to "jump out of the window."  I went to his room to evaluate him. Upon entering the room he was smiling and jovial. When I asked him about mentioning jumping out of the window, he said it was a "fleeting thought." He did not feel suicidal, but just thought it was a curious notion. He has never attempted suicide in the past. Currently, he says he does not feel depressed, does not want to harm or kill himself, and is hopeful to make a meaningful recovery.  Selina CooleyKyle Delmont Prosch MD

## 2015-11-14 NOTE — Consult Note (Signed)
Physical Medicine and Rehabilitation Consult Reason for Consult: Left corona radiata infarct Referring Physician: Internal medicine   HPI: Dominic Pineda is a 65 y.o. right handed male with history of bipolar disorder, hypertension, CVA 2009 maintained on aspirin as well as prior cocaine use. Per chart review patient lives with sister and one level home with 2 steps to entry. He used a cane prior to admission. Presented 11/11/2015 with new onset of slurred speech and right-sided weakness. MRI of the brain showed acute subcentimeter left corona radiata infarct as well as old left right MCA territory infarct. Old small cerebellar infarcts. Small area of left occipital lobe encephalomalacia. Echocardiogram with ejection fraction 65% grade 1 diastolic dysfunction. 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 Patient did not receive TPA. Neurology follow-up currently maintained on aspirin therapy as well as the addition of Plavix. Subcutaneous Lovenox for DVT prophylaxis. Physical therapy evaluation completed with recommendations of physical medicine rehabilitation consult.   Review of Systems  Constitutional: Negative for fever and chills.  Gastrointestinal: Positive for constipation.  Neurological: Negative for weakness.  All other systems reviewed and are negative.  Past Medical History  Diagnosis Date  . Hypertension   . Bipolar 1 disorder (HCC)   . Incontinence   . Mental disorder   . Depression   . Anxiety   . Blood transfusion 1970's  . Shortness of breath     has had a recent increase in episodes of shortness of breath and hyperventilating  . Stroke [facility](HCC) 2009-2010    "have had 3 strokes"; denies residual  . BPH (benign prostatic  hyperplasia)   . H/O acute renal failure 08/2011    severe w/hematuria  . Cocaine abuse    Past Surgical History  Procedure Laterality Date  . Incision and drainage of wound      right knee "got piece of wire in it while mowing"  . Inguinal hernia repair      left  . Tonsillectomy      "as a child"   Family History  Problem Relation Age of Onset  . Coronary artery disease Father    Social History:  reports that he quit smoking about 29 years ago. His smoking use included Cigarettes. He has a 18 pack-year smoking history. He has never used smokeless tobacco. He reports that he drinks alcohol. He reports that he uses illicit drugs ("Crack" cocaine and Marijuana). Allergies:  Allergies  Allergen Reactions  . Codeine Nausea Only   Medications Prior to Admission  Medication Sig Dispense Refill  . aspirin EC 81 MG tablet Take 81 mg by mouth daily.    . Cholecalciferol (VITAMIN D) 2000 units CAPS Take 2,000 Units by mouth daily.    Marland Kitchen. lisinopril (PRINIVIL,ZESTRIL) 10 MG tablet Take 10 mg by mouth daily.    . simvastatin (ZOCOR) 10 MG tablet Take 10 mg by mouth daily at 6 PM.    . verapamil (CALAN-SR) 240 MG CR tablet Take 1 tablet (240 mg total) by mouth at bedtime. (Patient taking differently: Take 240 mg by mouth every morning. ) 30 tablet 0    Home: Home Living Family/patient expects to be discharged to:: Private residence Living Arrangements: Other relatives (sister) Available Help at Discharge: Family, Available 24  hours/day Type of Home: House Home Access: Stairs to enter [employer] of Steps: 2 Home Layout: One level Bathroom Shower/Tub: Engineer, manufacturing systems: Standard Bathroom Accessibility: Yes Home Equipment: Cane - single point, Shower seat  Functional History: Prior Function Level of Independence: Needs assistance Gait / Transfers Assistance Needed: Pt reports he did not use a cane for mobility at home PTA. ADL's / Homemaking Assistance  Needed: Sister assisting with getting pt into tub PTA but pt was bathing and dressing. Sister does cooking/cleaning. Comments: Per RN, pt states he was walking independently at home with a SPC.  Functional Status:  Mobility: Bed Mobility Overal bed mobility: Needs Assistance Bed Mobility: Supine to Sit Supine to sit: Min assist, HOB elevated Sit to supine: Min assist General bed mobility comments: Min hand held assist to pull trunk into sitting position. VCs throughout for use of hand rails, technique, sequencing, and initiation. Transfers Overall transfer level: Needs assistance Equipment used: None Transfers: Sit to/from Stand Sit to Stand: Min guard General transfer comment: Min guard for safety with sit to stand, Min assist provided for balance in standing.       ADL: ADL Overall ADL's : Needs assistance/impaired Eating/Feeding: Set up, Sitting Grooming: Minimal assistance, Standing, Wash/dry hands Grooming Details (indicate cue type and reason): Assist for balance in standing Upper Body Bathing: Min guard, Sitting Lower Body Bathing: Moderate assistance, Sit to/from stand Upper Body Dressing : Minimal assistance, Sitting, Cueing for sequencing Upper Body Dressing Details (indicate cue type and reason): to don hospital gown Lower Body Dressing: Moderate assistance, Sit to/from stand Toilet Transfer: Minimal assistance, Ambulation, Regular Toilet Toilet Transfer Details (indicate cue type and reason): Simulated by transfer from EOB to chair. Pt incontinent PTA but sister reports he was able to clean himself independently. Toileting- Clothing Manipulation and Hygiene: Moderate assistance, Sit to/from stand Toileting - Clothing Manipulation Details (indicate cue type and reason): for toilet hygiene Functional mobility during ADLs: Minimal assistance General ADL Comments: Pt declining to use AD. Pt able to perform in-room mobility without AD; wide base of support and increased time  required.  Cognition: Cognition Overall Cognitive Status: History of cognitive impairments - at baseline Orientation Level: Oriented X4 Cognition Arousal/Alertness: Awake/alert Behavior During Therapy: WFL for tasks assessed/performed Overall Cognitive Status: History of cognitive impairments - at baseline Difficult to assess due to:  (Pt avoiding some orientation questions.)  Blood pressure 204/97, pulse 83, temperature 97.3 F (36.3 C), temperature source Oral, resp. rate 18, height  (1.778 m), weight 127.189 kg (280 lb 6.4 oz), SpO2 100 %. Physical Exam  Vitals reviewed. Constitutional: He is oriented to person, place, and time. He appears well-developed.  Obese  HENT:  Head: Normocephalic and atraumatic.  Eyes: Conjunctivae and EOM are normal.  Neck: Normal range of motion. Neck supple. No thyromegaly present.  Cardiovascular: Normal rate and regular rhythm.   Respiratory: Effort normal and breath sounds normal. No respiratory distress.  GI: Soft. Bowel sounds are normal. He exhibits no distension.  Musculoskeletal: He exhibits no edema or tenderness.  Neurological: He is alert and oriented to person, place, and time.  He does make good eye contact with examiner.  Speech appears to be slightly dysarthric.  Limited awareness of deficits ? Mild left facial weakness Sensation intact to light touch DTRs symmetric Motor: R UE/RLE: 5/5 proximal to distal LUE/LLE: 4+/5 proximal to distal  Skin: Skin is warm and dry.  Psychiatric: He has a normal mood and affect. His behavior is normal.  Results for orders placed or performed during the hospital encounter of 11/11/15 (from the past 24 hour(s))  Basic metabolic panel     Status: Abnormal   Collection Time: 11/14/15  7:07 AM  Result Value Ref Range   Sodium 139 135 - 145 mmol/L   Potassium 3.7 3.5 - 5.1 mmol/L   Chloride 102 101 - 111 mmol/L   CO2 29 22 - 32 mmol/L   Glucose, Bld 98 65 - 99 mg/dL   BUN 13 6 - 20  mg/dL   Creatinine, Ser 5.80 0.61 - 1.24 mg/dL   Calcium 8.8 (L) 8.9 - 10.3 mg/dL   GFR calc non Af Amer 59 (L) >60 mL/min   GFR calc Af Amer >60 >60 mL/min   Anion gap 8 5 - 15   No results found.  Assessment/Plan: Diagnosis: Left corona radiata infarct Labs and images independently reviewed.  Records reviewed and summated above. Stroke: Continue secondary stroke prophylaxis and Risk Factor Modification listed below:   Antiplatelet therapy Blood Pressure Management:  Continue current medication with prn's with permisive HTN per primary team Statin Agent Left sided hemiparesis: fit for orthotics to prevent contractures   1. Does the need for close, 24 hr/day medical supervision in concert with the patient's rehab needs make it unreasonable for this patient to be served in a less intensive setting? Potentially  2. Co-Morbidities requiring supervision/potential complications: bipolar disorder (ensure mood does not limit functional progress), HTN (monitor and provide prns in accordance with increased physical exertion and pain), history of CVA 2009, leukocytosis (cont to monitor for signs and symptoms of infection, further workup if indicated), AKI (avoid nephrotoxic meds), ABLA (transfuse if necessary to ensure appropriate perfusion for increased activity tolerance) 3. Due to safety, medication administration and patient education, does the patient require 24 hr/day rehab nursing? Yes 4. Does the patient require coordinated care of a physician, rehab nurse, PT (1-2 hrs/day, 5 days/week), OT (1-2 hrs/day, 5 days/week) and SLP (1-2 hrs/day, 5 days/week) to address physical and functional deficits in the context of the above medical diagnosis(es)? Potentially Addressing deficits in the following areas: balance, endurance, locomotion, strength, transferring, toileting, cognition, speech, language and psychosocial support 5. Can the patient actively participate in an intensive therapy program of at  least 3 hrs of therapy per day at least 5 days per week? Yes 6. The potential for patient to make measurable gains while on inpatient rehab is good 7. Anticipated functional outcomes upon discharge from inpatient rehab are modified independent and supervision  with PT, modified independent and supervision with OT, supervision and min assist with SLP. 8. Estimated rehab length of stay to reach the above functional goals is: 7-10 days. 9. Does the patient have adequate social supports and living environment to accommodate these discharge functional goals? Yes 10. Anticipated D/C setting: Home 11. Anticipated post D/C treatments: HH therapy and Home excercise program 12. Overall Rehab/Functional Prognosis: good  RECOMMENDATIONS: This patient's condition is appropriate for continued rehabilitative care in the following setting: Will await more thorough PT evaluation, however, likely CIR Patient has agreed to participate in recommended program. Yes Note that insurance prior authorization may be required for reimbursement for recommended care.  Comment: Rehab Admissions Coordinator to follow up.  Maryla Morrow, MD 11/14/2015

## 2015-11-14 NOTE — Progress Notes (Signed)
Physical Therapy Treatment Patient Details Name: Dominic Pineda MRN: [id number] DOB: September 13, 1950 Today's Date: 11/14/2015    History of Present Illness Pt is a 65 y/o male who presents with slurred speech and R-sided weakness. MRI revealed an acute subcentimeter L corona radiata infarction. PMH includes HTN, hyperlipidemia, previous CVA, bipolar disorder and cocaine abuse.      PT Comments    Dominic Pineda made progress today, ambulating 40 ft w/ min assist due to unsteady gait.  Pt told therapist, "I had a thought today about jumping out of the window".  RN notified and pt appropriate for psych consult, please order if you agree. Pt reports that he doesn't know why he had this thought and that it is not an actual desire.  Pt will benefit from continued skilled PT services to increase functional independence and safety.   Follow Up Recommendations  CIR;Supervision/Assistance - 24 hour     Equipment Recommendations  Rolling walker with 5" wheels;3in1 (PT)    Recommendations for Other Services Rehab consult     Precautions / Restrictions Precautions Precautions: Fall Restrictions Weight Bearing Restrictions: No    Mobility  Bed Mobility Overal bed mobility: Needs Assistance Bed Mobility: Supine to Sit;Sit to Supine     Supine to sit: Supervision Sit to supine: Supervision   General bed mobility comments: Pt uses bed rail to come to sitting and uses bed rails to scoot to [facility].  Transfers Overall transfer level: Needs assistance Equipment used: None Transfers: Sit to/from Stand Sit to Stand: Min guard         General transfer comment: Min instability noted w/ standing and on first sit>stand, pt requests to sit down because he "doesn't feel right".   Ambulation/Gait Ambulation/Gait assistance: Min assist Ambulation Distance (Feet): 40 Feet Assistive device: None Gait Pattern/deviations: Step-through pattern;Decreased stride length;Staggering left;Staggering right    Gait velocity interpretation: Below normal speed for age/gender General Gait Details: Unsteady gait requiring min assist to steady.  Pt requesting to sit after ambulating just 20 ft which he reports is due to fatigue.   Stairs            Wheelchair Mobility    Modified Rankin (Stroke Patients Only) Modified Rankin (Stroke Patients Only) Pre-Morbid Rankin Score: No symptoms Modified Rankin: Moderately severe disability     Balance Overall balance assessment: Needs assistance Sitting-balance support: No upper extremity supported;Feet supported Sitting balance-Leahy Scale: Good     Standing balance support: No upper extremity supported;During functional activity Standing balance-Leahy Scale: Fair Standing balance comment: min instabilility noted w/ static standing.                    Cognition Arousal/Alertness: Awake/alert Behavior During Therapy: WFL for tasks assessed/performed Overall Cognitive Status: No family/caregiver present to determine baseline cognitive functioning Area of Impairment: Orientation;Attention Orientation Level: Disoriented to;Situation (says he is here for UTI and high BP) Current Attention Level: Selective           General Comments: Pt very fidgety and requires cues to redirect and focus.      Exercises General Exercises - Lower Extremity Ankle Circles/Pumps: AROM;Both;10 reps;Seated Long Arc Quad: AROM;Both;10 reps;Seated Hip Flexion/Marching: AROM;Both;10 reps;Seated    General Comments General comments (skin integrity, edema, etc.): Notified RN of pt's comment, I had a thought today about jumping out of the window. Pt says this was just a thought and he is not sure why he had it and that it is not an actual desire.  Comforted pt that we and his family care about him, and pt in agreement to call RN if he has any further thoughts like this.      Pertinent Vitals/Pain Pain Assessment: No/denies pain    Home Living                       Prior Function            PT Goals (current goals can now be found in the care plan section) Acute Rehab PT Goals Patient Stated Goal: none stated PT Goal Formulation: With patient Time For Goal Achievement: 11/19/15 Potential to Achieve Goals: Good Progress towards PT goals: Progressing toward goals    Frequency  Min 4X/week    PT Plan Current plan remains appropriate    Co-evaluation             End of Session Equipment Utilized During Treatment: Gait belt Activity Tolerance: Patient tolerated treatment well;Patient limited by fatigue Patient left: in bed;with call bell/phone within reach;with bed alarm set     Time: 1514-1530 PT Time Calculation (min) (ACUTE ONLY): 16 min  Charges:  $Gait Training: 8-22 mins                    G Codes:      Dominic ChuAshley Pineda PT, DPT  Pager: [phone] Phone: [phone] 11/14/2015, 4:19 PM

## 2015-11-14 NOTE — Progress Notes (Signed)
Occupational Therapy Treatment Patient Details Name: Dominic Pineda MRN: [id number] DOB: Oct 11, 1950 Today's Date: 11/14/2015    History of present illness Pt is a 65 y.o. male who presents with slurred speech and R-sided weakness. MRI revealed an acute subcentimeter L corona radiata infarction. PMH includes HTN, hyperlipidemia, previous CVA, bipolar disorder and cocaine abuse.     OT comments  Pt progressing. Continue to recommend CIR for rehab.   Follow Up Recommendations  CIR;Supervision/Assistance - 24 hour    Equipment Recommendations  Other (comment) (TBD)    Recommendations for Other Services      Precautions / Restrictions Precautions Precautions: Fall Restrictions Weight Bearing Restrictions: No       Mobility Bed Mobility Overal bed mobility: Needs Assistance Bed Mobility: Supine to Sit;Sit to Supine     Supine to sit: Supervision Sit to supine: Supervision   General bed mobility comments: trendelenburg position used to help scoot HOB.   Transfers Overall transfer level: Needs assistance   Transfers: Sit to/from Stand Sit to Stand: Min guard              Balance      Min assist for ambulation.                             ADL Overall ADL's : Needs assistance/impaired     Grooming: Applying deodorant;Sitting;Set up;Supervision/safety   Upper Body Bathing: Supervision/ safety;Set up;Sitting;Standing   Lower Body Bathing: Minimal assistance (sitting and standing)   Upper Body Dressing : Minimal assistance;Sitting;Standing   Lower Body Dressing: Supervision/safety;Sitting/lateral leans Lower Body Dressing Details (indicate cue type and reason): doffed socks Toilet Transfer: Minimal assistance;Ambulation (sit to stand from bed and chair)           Functional mobility during ADLs:  (Min assist for ambulation; Min guard for sit to stand) General ADL Comments: Pt reported he was lightheaded in session.  Talked about long sponge  to use for LB bathing.      Vision                     Perception     Praxis      Cognition  Awake/Alert Behavior During Therapy: WFL for tasks assessed/performed Overall Cognitive Status: History of cognitive impairments - at baseline                       Extremity/Trunk Assessment               Exercises     Shoulder Instructions       General Comments      Pertinent Vitals/ Pain       Pain Assessment: No/denies pain  Home Living                                          Prior Functioning/Environment              Frequency Min 2X/week     Progress Toward Goals  OT Goals(current goals can now be found in the care plan section)  Progress towards OT goals: Progressing toward goals  Acute Rehab OT Goals Patient Stated Goal: not stated OT Goal Formulation: With patient/family Time For Goal Achievement: 11/26/15 Potential to Achieve Goals: Good ADL Goals Pt Will Perform Grooming: with modified independence;standing Pt Will Perform  Upper Body Bathing: with modified independence;sitting Pt Will Perform Lower Body Bathing: with modified independence;sit to/from stand Pt Will Perform Upper Body Dressing: with modified independence;sitting Pt Will Perform Lower Body Dressing: with modified independence;sit to/from stand Pt Will Perform Tub/Shower Transfer: Tub transfer;with supervision;ambulating;shower seat  Plan Discharge plan remains appropriate    Co-evaluation                 End of Session Equipment Utilized During Treatment: Gait belt   Activity Tolerance Other (comment) (reported lightheadedness)   Patient Left in bed;with call bell/phone within reach (talked to secretary to see if bed alarm was set)   Nurse Communication Other (comment) (something near pt's ankle; catheter off; lightheaded)        Time: 1350-1408 OT Time Calculation (min): 18 min  Charges: OT General Charges $OT Visit: 1  Procedure OT Treatments $Self Care/Home Management : 8-22 mins  Earlie Raveling OTR/L [phone] 11/14/2015, 2:56 PM

## 2015-11-14 NOTE — Progress Notes (Signed)
Subjective: No acute events overnight. He denies weakness, numbness, or trouble speaking.    Objective: Vital signs in last 24 hours: Filed Vitals:   11/13/15 1724 11/13/15 2131 11/14/15 0229 11/14/15 0500  BP: 199/93 184/85 177/77 204/97  Pulse: 83 67 68 83  Temp: 97.5 F (36.4 C) 97.5 F (36.4 C) 97.7 F (36.5 C) 97.3 F (36.3 C)  TempSrc: Oral Oral Oral Oral  Resp: 16 18 18 18   Height:      Weight:      SpO2: 93% 99% 97% 100%   Weight change:   Intake/Output Summary (Last 24 hours) at 11/14/15 0716 Last data filed at 11/14/15 0551  Gross per 24 hour  Intake    480 ml  Output    880 ml  Net   -400 ml   Physical Exam  Constitutional: He is oriented to person, place, and time.  Morbidly obese male, lying in bed, conversant, NAD.  HENT:  Head: Normocephalic and atraumatic.  Eyes: EOM are normal.  Neck: No tracheal deviation present.  Cardiovascular: Normal rate and regular rhythm.   Heart sounds distant.  No murmurs appreciated.  Pulmonary/Chest: Effort normal. No stridor. No respiratory distress. He has no wheezes.  Lungs clear in anterior zones.  Abdominal: Soft. There is no rebound and no guarding.  Obese. Nontender to palpation.  Musculoskeletal: He exhibits no edema.  Neurological: He is alert and oriented to person, place, and time.  CNII-XII intact.  Spontaneous movement of all four extremities.  Skin: Skin is warm and dry.    Lab Results: Basic Metabolic Panel:  Recent Labs Lab 11/11/15 1750 11/11/15 1810 11/12/15 0541  NA 137 140 142  K 3.7 3.6 3.3*  CL 101 100* 107  CO2 23  --  23  GLUCOSE 190* 190* 108*  BUN 14 18 12   CREATININE 1.19 1.00 1.01  CALCIUM 8.7*  --  8.7*   Liver Function Tests:  Recent Labs Lab 11/11/15 1750 11/12/15 0541  AST 29 10*  ALT 11* 8*  ALKPHOS 105 96  BILITOT 1.7* 1.0  PROT 6.7 6.5  ALBUMIN 3.5 3.2*   CBC:  Recent Labs Lab 11/11/15 1750 11/11/15 1810 11/12/15 0541  WBC 12.5*  --  14.2*    NEUTROABS 4.9  --   --   HGB 13.8 15.0 12.8*  HCT 40.5 44.0 37.6*  MCV 84.0  --  84.1  PLT 251  --  211   CBG:  Recent Labs Lab 11/11/15 1742  GLUCAP 181*   Fasting Lipid Panel:  Recent Labs Lab 11/12/15 0541  CHOL 209*  HDL 35*  LDLCALC 155*  TRIG 93  CHOLHDL 6.0   Coagulation:  Recent Labs Lab 11/11/15 1750  LABPROT 14.6  INR 1.12   Urine Drug Screen: Drugs of Abuse     Component Value Date/Time   LABOPIA NONE DETECTED 11/11/2015 1825   COCAINSCRNUR NONE DETECTED 11/11/2015 1825   LABBENZ NONE DETECTED 11/11/2015 1825   AMPHETMU NONE DETECTED 11/11/2015 1825   THCU NONE DETECTED 11/11/2015 1825   LABBARB NONE DETECTED 11/11/2015 1825    Alcohol Level:  Recent Labs Lab 11/11/15 1750  ETH <5   Urinalysis:  Recent Labs Lab 11/11/15 1825  COLORURINE AMBER*  LABSPEC 1.021  PHURINE 5.5  GLUCOSEU NEGATIVE  HGBUR SMALL*  BILIRUBINUR NEGATIVE  KETONESUR NEGATIVE  PROTEINUR NEGATIVE  NITRITE POSITIVE*  LEUKOCYTESUR LARGE*   Misc. Labs:   Micro Results: Recent Results (from the past 240 hour(s))  Urine  culture     Status: None   Collection Time: 11/11/15  8:46 PM  Result Value Ref Range Status   Specimen Description URINE, CLEAN CATCH  Final   Special Requests Normal  Final   Culture MULTIPLE SPECIES PRESENT, SUGGEST RECOLLECTION  Final   Report Status 11/13/2015 FINAL  Final  MRSA PCR Screening     Status: None   Collection Time: 11/12/15  2:11 PM  Result Value Ref Range Status   MRSA by PCR NEGATIVE NEGATIVE Final    Comment:        The GeneXpert MRSA Assay (FDA approved for NASAL specimens only), is one component of a comprehensive MRSA colonization surveillance program. It is not intended to diagnose MRSA infection nor to guide or monitor treatment for MRSA infections.    Studies/Results: [name] Wo Contrast  11/12/2015  CLINICAL DATA:  65 y.o. male history of hypertension, hyperlipidemia, previous stroke, bipolar  disorder and cocaine abuse, brought to the emergency room following acute onset of slurred speech and right-sided weakness. EXAM: MRA HEAD WITHOUT CONTRAST TECHNIQUE: Angiographic images of the Circle of Willis were obtained using MRA technique without intravenous contrast. COMPARISON:  MRI brain performed 11/11/2015. FINDINGS: Anterior circulation: Severe stenosis distal cavernous segment RIGHT ICA. Mild irregularity supraclinoid and ICA terminus on the RIGHT. Severe stenosis at the origin of the RIGHT M1 MCA. There is bulbous prominence of the M1 RIGHT MCA distal to the stenosis, favored to represent poststenotic dilatation. Saccular or mycotic aneurysm is felt less likely. Mild irregularity of the distal MCA branches without focal vessel cut off. Normal A1 ACA. At the junction of the RIGHT A1 ACA with the anterior communicating, there is a 3 mm outpouching, possible saccular aneurysm. Diffusely irregular cavernous and supraclinoid ICA on the LEFT. Irregular LEFT A1 ACA. Focal stenosis RIGHT M1 MCA origin, likely flow reducing. Moderately irregular M2 and M3 branches. Posterior circulation: Basilar is diffusely irregular, with proximal stenosis potentially flow reducing. LEFT vertebral is the sole contributor with diffuse irregularity of its distal V3 and entire V4 segment. RIGHT vertebral nonvisualized. Moderately diseased BILATERAL superior cerebellar arteries. Apparent common AICA and PICA trunk on the RIGHT. LEFT PICA also patent. LEFT AICA diseased. 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 Similar less severe changes on the LEFT. Bulbous prominence of the mid RIGHT M1 MCA, favored to represent poststenotic dilatation. 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 Electronically  Signed   By: Elsie Stain M.D.   On: 11/12/2015 10:29   Medications: I have reviewed the patient's current medications. Scheduled Meds: . aspirin EC  81 mg Oral Daily  . atorvastatin  40 mg Oral q1800  . cholecalciferol  2,000 Units Oral Daily  . ciprofloxacin  500 mg Oral BID  . clopidogrel  75 mg Oral Daily  . enoxaparin (LOVENOX) injection  40 mg Subcutaneous Q24H  . lisinopril  40 mg Oral Daily   Continuous Infusions:  PRN Meds:.acetaminophen **OR** acetaminophen, hydrALAZINE, senna-docusate Assessment/Plan:  Mr. Levinson is a 65yo with HTN, h/o crack cocaine abuse, multiple CVAs (last 2010) who presents with slurred speech and that earlier this afternoon.  Acute CVA of L corona radiata: Patient presented with transient focal neurologic symptoms in the setting of poorly-controlled HTN and multiple CVAs. UDS here negative. Patient reports compliance with his meds.  CT without new findings, with old R MCA encephalomalacia. MRI confirms acute CVA of L corona radiata.  Carotid US shows 1-39% on the right and 40-59%on the left. Echo with EF 60-65%, mild LVH, grade 1 diastolic dysfunction. MRA with diffuse intracranial atherosclerotic changes likely related to his drug abuse and stenoses in the right anterior circulation as noted above. A1c 5.5%. Patient will take ASA/Plavix for 3 months, then Plavix alone.  PT/OT recommending CIR, consult pending.  - Neurology following, appreciate recommendations - Increase Lisinopril 40 mg daily - Plavix 75 mg daily - Atorvastatin 40 mg - ASA 81 mg - PT/OT/SLP recs greatly appreciated -Telemetry - CIR pending  Acute cystitis: Patient with mild leukocytosis and UA suggestive of UTI without symptoms. He is s/p 1g CTX in the ED.  Given he is male, we will treat as complicated cystitis.   - UCx pending  - Ciprofloxacin 500 mg BID  FEN/GI: HH DVT Ppx: Lovenox SQ Dispo: Discharge to CIR possibly tomorrow   The patient does not have a current PCP  (Provider Not In System) and does need an [place] hospital follow-up appointment after discharge.  The patient does not have transportation limitations that hinder transportation to clinic appointments.  .Services Needed at time of discharge: Y = Yes, Blank = No PT:   OT:   RN:   Equipment:   Other:     LOS: 2 days   Jana Half, MD 11/14/2015, 7:16 AM

## 2015-11-15 ENCOUNTER — Inpatient Hospital Stay (HOSPITAL_COMMUNITY)
Admission: RE | Admit: 2015-11-15 | Discharge: 2015-11-28 | DRG: 065 | Disposition: A | Discharge status: Skilled Nursing Facility | Payer: Commercial Managed Care - HMO | Source: Intra-hospital | Attending: Physical Medicine & Rehabilitation | Admitting: Physical Medicine & Rehabilitation

## 2015-11-15 ENCOUNTER — Telehealth: Payer: Self-pay | Admitting: Internal Medicine

## 2015-11-15 ENCOUNTER — Encounter (HOSPITAL_COMMUNITY): Payer: Self-pay | Admitting: *Deleted

## 2015-11-15 DIAGNOSIS — F319 Bipolar disorder, unspecified: Secondary | ICD-10-CM | POA: Diagnosis present

## 2015-11-15 DIAGNOSIS — R159 Full incontinence of feces: Secondary | ICD-10-CM | POA: Diagnosis present

## 2015-11-15 DIAGNOSIS — I10 Essential (primary) hypertension: Secondary | ICD-10-CM | POA: Diagnosis not present

## 2015-11-15 DIAGNOSIS — Z6841 Body Mass Index (BMI) 40.0 and over, adult: Secondary | ICD-10-CM

## 2015-11-15 DIAGNOSIS — I63039 Cerebral infarction due to thrombosis of unspecified carotid artery: Secondary | ICD-10-CM | POA: Diagnosis not present

## 2015-11-15 DIAGNOSIS — Z885 Allergy status to narcotic agent status: Secondary | ICD-10-CM

## 2015-11-15 DIAGNOSIS — T783XXA Angioneurotic edema, initial encounter: Secondary | ICD-10-CM | POA: Diagnosis not present

## 2015-11-15 DIAGNOSIS — E8809 Other disorders of plasma-protein metabolism, not elsewhere classified: Secondary | ICD-10-CM | POA: Diagnosis present

## 2015-11-15 DIAGNOSIS — I69951 Hemiplegia and hemiparesis following unspecified cerebrovascular disease affecting right dominant side: Secondary | ICD-10-CM | POA: Diagnosis not present

## 2015-11-15 DIAGNOSIS — R1310 Dysphagia, unspecified: Secondary | ICD-10-CM

## 2015-11-15 DIAGNOSIS — N400 Enlarged prostate without lower urinary tract symptoms: Secondary | ICD-10-CM | POA: Diagnosis present

## 2015-11-15 DIAGNOSIS — R32 Unspecified urinary incontinence: Secondary | ICD-10-CM | POA: Diagnosis present

## 2015-11-15 DIAGNOSIS — D62 Acute posthemorrhagic anemia: Secondary | ICD-10-CM | POA: Diagnosis not present

## 2015-11-15 DIAGNOSIS — R4182 Altered mental status, unspecified: Secondary | ICD-10-CM | POA: Diagnosis present

## 2015-11-15 DIAGNOSIS — E669 Obesity, unspecified: Secondary | ICD-10-CM | POA: Diagnosis present

## 2015-11-15 DIAGNOSIS — I69354 Hemiplegia and hemiparesis following cerebral infarction affecting left non-dominant side: Secondary | ICD-10-CM

## 2015-11-15 DIAGNOSIS — Y848 Other medical procedures as the cause of abnormal reaction of the patient, or of later complication, without mention of misadventure at the time of the procedure: Secondary | ICD-10-CM | POA: Diagnosis not present

## 2015-11-15 DIAGNOSIS — I639 Cerebral infarction, unspecified: Secondary | ICD-10-CM | POA: Diagnosis present

## 2015-11-15 DIAGNOSIS — I999 Unspecified disorder of circulatory system: Secondary | ICD-10-CM | POA: Diagnosis not present

## 2015-11-15 DIAGNOSIS — I69922 Dysarthria following unspecified cerebrovascular disease: Secondary | ICD-10-CM | POA: Diagnosis not present

## 2015-11-15 DIAGNOSIS — Z7982 Long term (current) use of aspirin: Secondary | ICD-10-CM | POA: Diagnosis not present

## 2015-11-15 DIAGNOSIS — R0600 Dyspnea, unspecified: Secondary | ICD-10-CM | POA: Diagnosis present

## 2015-11-15 DIAGNOSIS — Z8673 Personal history of transient ischemic attack (TIA), and cerebral infarction without residual deficits: Secondary | ICD-10-CM

## 2015-11-15 DIAGNOSIS — F329 Major depressive disorder, single episode, unspecified: Secondary | ICD-10-CM | POA: Diagnosis present

## 2015-11-15 DIAGNOSIS — F09 Unspecified mental disorder due to known physiological condition: Secondary | ICD-10-CM | POA: Diagnosis not present

## 2015-11-15 DIAGNOSIS — I6330 Cerebral infarction due to thrombosis of unspecified cerebral artery: Secondary | ICD-10-CM

## 2015-11-15 DIAGNOSIS — T464X5A Adverse effect of angiotensin-converting-enzyme inhibitors, initial encounter: Secondary | ICD-10-CM | POA: Diagnosis not present

## 2015-11-15 DIAGNOSIS — E785 Hyperlipidemia, unspecified: Secondary | ICD-10-CM | POA: Diagnosis present

## 2015-11-15 DIAGNOSIS — F0280 Dementia in other diseases classified elsewhere without behavioral disturbance: Secondary | ICD-10-CM | POA: Diagnosis present

## 2015-11-15 DIAGNOSIS — I69920 Aphasia following unspecified cerebrovascular disease: Secondary | ICD-10-CM | POA: Diagnosis not present

## 2015-11-15 DIAGNOSIS — F01A0 Vascular dementia, mild, without behavioral disturbance, psychotic disturbance, mood disturbance, and anxiety: Secondary | ICD-10-CM | POA: Insufficient documentation

## 2015-11-15 DIAGNOSIS — F419 Anxiety disorder, unspecified: Secondary | ICD-10-CM | POA: Diagnosis present

## 2015-11-15 DIAGNOSIS — Z87891 Personal history of nicotine dependence: Secondary | ICD-10-CM

## 2015-11-15 DIAGNOSIS — Z79899 Other long term (current) drug therapy: Secondary | ICD-10-CM | POA: Diagnosis not present

## 2015-11-15 DIAGNOSIS — F0150 Vascular dementia without behavioral disturbance: Secondary | ICD-10-CM | POA: Insufficient documentation

## 2015-11-15 DIAGNOSIS — J029 Acute pharyngitis, unspecified: Secondary | ICD-10-CM | POA: Diagnosis present

## 2015-11-15 DIAGNOSIS — Z8249 Family history of ischemic heart disease and other diseases of the circulatory system: Secondary | ICD-10-CM

## 2015-11-15 DIAGNOSIS — R0602 Shortness of breath: Secondary | ICD-10-CM

## 2015-11-15 DIAGNOSIS — R531 Weakness: Secondary | ICD-10-CM

## 2015-11-15 LAB — BASIC METABOLIC PANEL
Anion gap: 8 (ref 5–15)
BUN: 11 mg/dL (ref 6–20)
CALCIUM: 8.8 mg/dL — AB (ref 8.9–10.3)
CHLORIDE: 105 mmol/L (ref 101–111)
CO2: 27 mmol/L (ref 22–32)
CREATININE: 1.07 mg/dL (ref 0.61–1.24)
GFR calc Af Amer: >60 mL/min (ref >60)
GFR calc non Af Amer: >60 mL/min (ref >60)
GLUCOSE: 100 mg/dL — AB (ref 65–99)
Potassium: 3.6 mmol/L (ref 3.5–5.1)
Sodium: 140 mmol/L (ref 135–145)

## 2015-11-15 MED ORDER — ATORVASTATIN CALCIUM 40 MG PO TABS
40.0000 mg | ORAL_TABLET | Freq: Every day | ORAL | Status: DC
  Administered 2015-11-15 – 2015-11-27 (×12): 40 mg via ORAL
  Filled 2015-11-15 (×13): qty 1

## 2015-11-15 MED ORDER — ENOXAPARIN SODIUM 40 MG/0.4ML ~~LOC~~ SOLN
40.0000 mg | SUBCUTANEOUS | Status: DC
  Administered 2015-11-15 – 2015-11-27 (×13): 40 mg via SUBCUTANEOUS
  Filled 2015-11-15 (×13): qty 0.4

## 2015-11-15 MED ORDER — LISINOPRIL 40 MG PO TABS
40.0000 mg | ORAL_TABLET | Freq: Every day | ORAL | Status: DC
  Administered 2015-11-16 – 2015-11-24 (×9): 40 mg via ORAL
  Filled 2015-11-15 (×9): qty 1

## 2015-11-15 MED ORDER — CIPROFLOXACIN HCL 500 MG PO TABS: 500.0000 mg | ORAL_TABLET | Freq: Two times a day (BID) | ORAL | Status: DC

## 2015-11-15 MED ORDER — CLOPIDOGREL BISULFATE 75 MG PO TABS: 75.0000 mg | ORAL_TABLET | Freq: Every day | ORAL | Status: AC

## 2015-11-15 MED ORDER — VITAMIN D3 25 MCG (1000 UNIT) PO TABS
2000.0000 [IU] | ORAL_TABLET | Freq: Every day | ORAL | Status: DC
  Administered 2015-11-16 – 2015-11-28 (×12): 2000 [IU] via ORAL
  Filled 2015-11-15 (×24): qty 2

## 2015-11-15 MED ORDER — ACETAMINOPHEN 650 MG RE SUPP: 650.0000 mg | RECTAL | Status: DC | PRN

## 2015-11-15 MED ORDER — SORBITOL 70 % SOLN
30.0000 mL | Freq: Every day | Status: DC | PRN
Start: 2015-11-15 — End: 2015-11-28

## 2015-11-15 MED ORDER — SENNOSIDES-DOCUSATE SODIUM 8.6-50 MG PO TABS: 1.0000 | ORAL_TABLET | Freq: Every evening | ORAL | Status: DC | PRN

## 2015-11-15 MED ORDER — ASPIRIN EC 81 MG PO TBEC
81.0000 mg | DELAYED_RELEASE_TABLET | Freq: Every day | ORAL | Status: DC
Start: 2015-11-16 — End: 2015-11-28
  Administered 2015-11-16 – 2015-11-28 (×12): 81 mg via ORAL
  Filled 2015-11-15 (×13): qty 1

## 2015-11-15 MED ORDER — ONDANSETRON HCL 4 MG/2ML IJ SOLN: 4.0000 mg | Freq: Four times a day (QID) | INTRAMUSCULAR | Status: DC | PRN

## 2015-11-15 MED ORDER — LISINOPRIL 40 MG PO TABS: 40.0000 mg | ORAL_TABLET | Freq: Every day | ORAL | Status: DC

## 2015-11-15 MED ORDER — CLOPIDOGREL BISULFATE 75 MG PO TABS
75.0000 mg | ORAL_TABLET | Freq: Every day | ORAL | Status: DC
  Administered 2015-11-16 – 2015-11-28 (×12): 75 mg via ORAL
  Filled 2015-11-15 (×13): qty 1

## 2015-11-15 MED ORDER — ATORVASTATIN CALCIUM 40 MG PO TABS: 40.0000 mg | ORAL_TABLET | Freq: Every day | ORAL | Status: DC

## 2015-11-15 MED ORDER — VERAPAMIL HCL ER 240 MG PO TBCR
240.0000 mg | EXTENDED_RELEASE_TABLET | Freq: Every day | ORAL | Status: DC
  Administered 2015-11-15 – 2015-11-16 (×2): 240 mg via ORAL
  Filled 2015-11-15 (×2): qty 1

## 2015-11-15 MED ORDER — ASPIRIN EC 81 MG PO TBEC: 81.0000 mg | DELAYED_RELEASE_TABLET | Freq: Every day | ORAL | Status: AC

## 2015-11-15 MED ORDER — CIPROFLOXACIN HCL 500 MG PO TABS
500.0000 mg | ORAL_TABLET | Freq: Two times a day (BID) | ORAL | Status: DC
  Administered 2015-11-15 – 2015-11-23 (×16): 500 mg via ORAL
  Filled 2015-11-15 (×16): qty 1

## 2015-11-15 MED ORDER — AMLODIPINE BESYLATE 10 MG PO TABS: 10.0000 mg | ORAL_TABLET | Freq: Every day | ORAL | Status: DC

## 2015-11-15 MED ORDER — ONDANSETRON HCL 4 MG PO TABS: 4.0000 mg | ORAL_TABLET | Freq: Four times a day (QID) | ORAL | Status: DC | PRN

## 2015-11-15 MED ORDER — ACETAMINOPHEN 325 MG PO TABS: 650.0000 mg | ORAL_TABLET | ORAL | Status: DC | PRN

## 2015-11-15 MED ORDER — VERAPAMIL HCL ER 240 MG PO TBCR
240.0000 mg | EXTENDED_RELEASE_TABLET | Freq: Every day | ORAL | Status: DC
  Filled 2015-11-15: qty 1

## 2015-11-15 MED ORDER — ENOXAPARIN SODIUM 40 MG/0.4ML ~~LOC~~ SOLN: 40.0000 mg | SUBCUTANEOUS | Status: DC

## 2015-11-15 NOTE — H&P (Signed)
Physical Medicine and Rehabilitation Admission H&P    Chief Complaint  Patient presents with  . Aphasia  : HPI: Dominic Pineda is a 65 y.o. right handed male with history of bipolar disorder, hypertension, CVA 2009 maintained on aspirin as well as prior cocaine use. Per chart review patient lives with sister and one level home with 2 steps to entry. He used a cane prior to admission. Presented 11/11/2015 with new onset of slurred speech and right-sided weakness.Urine drug screen negative. MRI of the brain showed acute subcentimeter left corona radiata infarct as well as old  right MCA territory infarct. Old small cerebellar infarcts. Small area of left occipital lobe encephalomalacia. Echocardiogram with ejection fraction 78% grade 1 diastolic dysfunction. 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 Patient did not receive TPA. Neurology follow-up currently maintained on aspirin therapy as well as the addition of Plavix. Subcutaneous Lovenox for DVT prophylaxis. Urinalysis 11/11/2015 positive nitrite with culture multiple species present and mild leukocytosis 14,200 and placed on Cipro empirically for 15 2017. PhysicalAnd occupational therapy evaluations completed with recommendations of physical medicine rehabilitation consult.Patient was admitted for a comprehensive rehabilitation program  Review of Systems  Constitutional: Negative for fever and chills.  HENT: Negative for hearing loss.   Eyes: Negative for blurred vision and double vision.  Respiratory: Negative for cough and shortness of breath.   Cardiovascular: Negative for chest pain, palpitations and leg swelling.  Gastrointestinal: Positive for constipation. Negative for nausea and vomiting.    Genitourinary: Negative for dysuria and hematuria.  Musculoskeletal: Positive for myalgias.  Skin: Negative for rash.  Neurological: Positive for speech change and headaches. Negative for seizures, loss of consciousness and weakness.  Psychiatric/Behavioral:       Bipolar disorder   Past Medical History  Diagnosis Date  . Hypertension   . Bipolar 1 disorder ([place])   . Incontinence   . Mental disorder   . Depression   . Anxiety   . Blood transfusion 1970's  . Shortness of breath     has had a recent increase in episodes of shortness of breath and hyperventilating  . Stroke [facility]) 2009-2010    "have had 3 strokes"; denies residual  . BPH (benign prostatic hyperplasia)   . H/O acute renal failure 08/2011    severe w/hematuria  . Cocaine abuse    Past Surgical History  Procedure Laterality Date  . Incision and drainage of wound      right knee "got piece of wire in it while mowing"  . Inguinal hernia repair      left  . Tonsillectomy      "as a child"   Family History  Problem Relation Age of Onset  . Coronary artery disease Father    Social History:  reports that he quit smoking about 29 years ago. His smoking use included Cigarettes. He has a 18 pack-year smoking history. He has never used smokeless tobacco. He reports that he drinks alcohol. He reports that he uses illicit drugs ("Crack" cocaine and Marijuana). Allergies:  Allergies  Allergen Reactions  . Codeine Nausea Only   Medications Prior to Admission  Medication Sig Dispense Refill  . Cholecalciferol (VITAMIN D) 2000 units CAPS Take 2,000 Units by mouth daily.    Marland Kitchen  simvastatin (ZOCOR) 10 MG tablet Take 10 mg by mouth daily at 6 PM.    . verapamil (CALAN-SR) 240 MG CR tablet Take 1 tablet (240 mg total) by mouth at bedtime. (Patient taking differently: Take 240 mg by mouth every morning. ) 30 tablet 0  . [DISCONTINUED] aspirin EC 81 MG tablet Take 81 mg by mouth daily.    . [DISCONTINUED] lisinopril  (PRINIVIL,ZESTRIL) 10 MG tablet Take 10 mg by mouth daily.      Home: Home Living Family/patient expects to be discharged to:: Private residence Living Arrangements: Other relatives (sister) Available Help at Discharge: Family, Available 24 hours/day Type of Home: House Home Access: Stairs to enter [employer] of Steps: 2 [place]: One level Bathroom Shower/Tub: Chiropodist: Standard Bathroom Accessibility: Yes Home Equipment: Cane - single point, Industrial/product designer History: Prior Function Level of Independence: Needs assistance Gait / Transfers Assistance Needed: Pt reports he did not use a cane for mobility at home PTA. ADL's / Homemaking Assistance Needed: Sister assisting with getting pt into tub PTA but pt was bathing and dressing. Sister does cooking/cleaning. Comments: Per sister, patient did not use a device such as a cane or walker.  Functional Status:  Mobility: Bed Mobility Overal bed mobility: Needs Assistance Bed Mobility: Supine to Sit, Sit to Supine Supine to sit: Supervision Sit to supine: Supervision General bed mobility comments: Pt uses bed rail to come to sitting and uses bed rails to scoot to [facility]. Transfers Overall transfer level: Needs assistance Equipment used: None Transfers: Sit to/from Stand Sit to Stand: Min guard General transfer comment: Min instability noted w/ standing and on first sit>stand, pt requests to sit down because he "doesn't feel right".  Ambulation/Gait Ambulation/Gait assistance: Min assist Ambulation Distance (Feet): 40 Feet Assistive device: None Gait Pattern/deviations: Step-through pattern, Decreased stride length, Staggering left, Staggering right General Gait Details: Unsteady gait requiring min assist to steady.  Pt requesting to sit after ambulating just 20 ft which he reports is due to fatigue. Gait velocity interpretation: Below normal speed for age/gender    ADL: ADL Overall  ADL's : Needs assistance/impaired Eating/Feeding: Set up, Sitting Grooming: Applying deodorant, Sitting, Set up, Supervision/safety Grooming Details (indicate cue type and reason): Assist for balance in standing Upper Body Bathing: Supervision/ safety, Set up, Sitting, Standing Lower Body Bathing: Minimal assistance (sitting and standing) Upper Body Dressing : Minimal assistance, Sitting, Standing Upper Body Dressing Details (indicate cue type and reason): to don hospital gown Lower Body Dressing: Supervision/safety, Sitting/lateral leans Lower Body Dressing Details (indicate cue type and reason): doffed socks Toilet Transfer: Minimal assistance, Ambulation (sit to stand from bed and chair) Toilet Transfer Details (indicate cue type and reason): Simulated by transfer from EOB to chair. Pt incontinent PTA but sister reports he was able to clean himself independently. Toileting- Clothing Manipulation and Hygiene: Moderate assistance, Sit to/from stand Toileting - Clothing Manipulation Details (indicate cue type and reason): for toilet hygiene Functional mobility during ADLs:  (Min assist for ambulation; Min guard for sit to stand) General ADL Comments: Pt reported he was lightheaded in session. Talked about using long sponge for LB bathing.  Cognition: Cognition Overall Cognitive Status: No family/caregiver present to determine baseline cognitive functioning Orientation Level: Oriented X4 Cognition Arousal/Alertness: Awake/alert Behavior During Therapy: WFL for tasks assessed/performed Overall Cognitive Status: No family/caregiver present to determine baseline cognitive functioning Area of Impairment: Orientation, Attention Orientation Level: Disoriented to, Situation (says he is here for UTI and high  BP) Current Attention Level: Selective General Comments: Pt very fidgety and requires cues to redirect and focus.   Difficult to assess due to:  (Pt avoiding some orientation  questions.)  Physical Exam: Blood pressure 166/70, pulse 67, temperature 98.7 F (37.1 C), temperature source Oral, resp. rate 18, height 5\' 10"  (1.778 m), weight 127.189 kg (280 lb 6.4 oz), SpO2 97 %. Physical Exam Constitutional: He is oriented to person, place, and time. He appears well-developed.  Obese  HENT:  Head: Normocephalic and atraumatic.  Eyes: Conjunctivae and EOM are normal.  Neck: Normal range of motion. Neck supple. No thyromegaly present.  Cardiovascular: Normal rate and regular rhythm.  Respiratory: Effort normal and breath sounds normal. No respiratory distress.  GI: Soft. Bowel sounds are normal. He exhibits no distension.  Musculoskeletal: He exhibits no edema or tenderness.  Neurological: He is alert and oriented to person, place, and time.  He does make good eye contact with examiner.  Speech is dysarthric. Left central 7. Good tongue control Sensation intact to light touch and pain in all 4 limbs. DTRs symmetric and 2+ Motor: R UE/RLE: 4+/5 proximal to distal  LUE: 4 to 4+/5 deltoid, bicep, tricep, wrist, finger. LLE: 4+/5 HF, KE, ADF/PF.  Cognitively he displays reasonable insight and awareness. Memory is functional Skin: Skin is warm and dry.  Psychiatric: He has a normal mood and affect. His behavior is normal   Results for orders placed or performed during the hospital encounter of 11/11/15 (from the past 48 hour(s))  Basic metabolic panel     Status: Abnormal   Collection Time: 11/14/15  7:07 AM  Result Value Ref Range   Sodium 139 135 - 145 mmol/L   Potassium 3.7 3.5 - 5.1 mmol/L   Chloride 102 101 - 111 mmol/L   CO2 29 22 - 32 mmol/L   Glucose, Bld 98 65 - 99 mg/dL   BUN 13 6 - 20 mg/dL   Creatinine, Ser 11/16/15 0.61 - 1.24 mg/dL   Calcium 8.8 (L) 8.9 - 10.3 mg/dL   GFR calc non Af Amer 59 (L) >60 mL/min   GFR calc Af Amer >60 >60 mL/min    Comment: (NOTE) The eGFR has been calculated using the CKD EPI equation. This calculation has not been  validated in all clinical situations. eGFR's persistently <60 mL/min signify possible Chronic Kidney Disease.    Anion gap 8 5 - 15  Basic metabolic panel     Status: Abnormal   Collection Time: 11/15/15  7:42 AM  Result Value Ref Range   Sodium 140 135 - 145 mmol/L   Potassium 3.6 3.5 - 5.1 mmol/L   Chloride 105 101 - 111 mmol/L   CO2 27 22 - 32 mmol/L   Glucose, Bld 100 (H) 65 - 99 mg/dL   BUN 11 6 - 20 mg/dL   Creatinine, Ser 11/17/15 0.61 - 1.24 mg/dL   Calcium 8.8 (L) 8.9 - 10.3 mg/dL   GFR calc non Af Amer >60 >60 mL/min   GFR calc Af Amer >60 >60 mL/min    Comment: (NOTE) The eGFR has been calculated using the CKD EPI equation. This calculation has not been validated in all clinical situations. eGFR's persistently <60 mL/min signify possible Chronic Kidney Disease.    Anion gap 8 5 - 15   No results found.     Medical Problem List and Plan: 1.  Right sided weakness and dysarthria secondary to left corona radiata infarct as well as remote right MCA  territory infarct 2.  DVT Prophylaxis/Anticoagulation: Subcutaneous Lovenox. Monitor platelet counts and any signs of bleeding 3. Pain Management: Tylenol as needed 4. Mood/bipolar disorder. No present medications. Plan to discuss with sister. 5. Neuropsych: This patient is capable of making decisions on his own behalf. 6. Skin/Wound Care: Routine skin checks 7. Fluids/Electrolytes/Nutrition: Routine I&O with follow-up chemistries 8. Hypertension. Lisinopril 40 mg daily. Monitor with increased mobility 9. Hyperlipidemia. Lipitor 10. Remote history of cocaine abuse. Urine drug screen negative. 11. Positive urine study 11/11/2015 with mild leukocytosis. Cipro initiated 11/12/2015  Post Admission Physician Evaluation: 1. Functional deficits secondary  to left corona radiata infarct/ prior right MCA infarct. 2. Patient is admitted to receive collaborative, interdisciplinary care between the physiatrist, rehab nursing staff, and  therapy team. 3. Patient's level of medical complexity and substantial therapy needs in context of that medical necessity cannot be provided at a lesser intensity of care such as a SNF. 4. Patient has experienced substantial functional loss from his/her baseline which was documented above under the "Functional History" and "Functional Status" headings.  Judging by the patient's diagnosis, physical exam, and functional history, the patient has potential for functional progress which will result in measurable gains while on inpatient rehab.  These gains will be of substantial and practical use upon discharge  in facilitating mobility and self-care at the household level. 5. Physiatrist will provide 24 hour management of medical needs as well as oversight of the therapy plan/treatment and provide guidance as appropriate regarding the interaction of the two. 6. 24 hour rehab nursing will assist with bladder management, bowel management, safety, skin/wound care, disease management, medication administration, pain management and patient education  and help integrate therapy concepts, techniques,education, etc. 7. PT will assess and treat for/with: Lower extremity strength, range of motion, stamina, balance, functional mobility, safety, adaptive techniques and equipment, NMR, ego support, community reintegration.   Goals are: mod I. 8. OT will assess and treat for/with: ADL's, functional mobility, safety, upper extremity strength, adaptive techniques and equipment, NMR, education, ego support, community reintegration.   Goals are: mod I. Therapy may proceed with showering this patient. 9. SLP will assess and treat for/with: speech,communication.  Goals are: mod I. 10. Case Management and Social Worker will assess and treat for psychological issues and discharge planning. 11. Team conference will be held weekly to assess progress toward goals and to determine barriers to discharge. 12. Patient will receive at  least 3 hours of therapy per day at least 5 days per week. 13. ELOS: 7 days       14. Prognosis:  excellent     Meredith Staggers, MD, [facility] 11/15/2015   11/15/2015

## 2015-11-15 NOTE — PMR Pre-admission (Signed)
PMR Admission Coordinator Pre-Admission Assessment  Patient: Dominic Pineda is an 65 y.o., male MRN: [id number] DOB: [birth year] Height: _0  (177.8 cm) Weight: 127.189 kg (280 lb 6.4 oz)              Insurance Information HMO: Yes    PPO:       PCP:       IPA:       80/20:       OTHER:   PRIMARY:  Laurence Spates [facility]      Policy#: [id number]      Subscriber: Hilda Blades CM Name: Erik Obey     Phone#: [phone]     Fax#: [phone] Pre-Cert#: [id number] from 82/70 to 11/21/15      Employer: Not employed Benefits:  Phone #: [phone]     Name:  Pollyann Glen. Date: 04/29/14     Deduct: $0      Out of Pocket Max: $5900 (met $0)      Life Max: unlimited CIR: $275 days 1-6      SNF: $0 days 1-20; $160 days 21-100 Outpatient: medical necessity     Co-Pay: $40 Home Health: 100%      Co-Pay: none DME: 80%     Co-Pay: 20% Providers: in network  Emergency Contact Information Contact Information    Name Relation Home Work Mobile   [place] Daughter   [phone]   Baxter Hire   [phone]     Current Medical History  Patient Admitting Diagnosis:  L CR infarct  History of Present Illness: A 65 y.o. right handed male with history of bipolar disorder, hypertension, CVA 2009 maintained on aspirin as well as prior cocaine use. Per chart review patient lives with sister and one level home with 2 steps to entry. He used a cane prior to admission. Presented 11/11/2015 with new onset of slurred speech and right-sided weakness. MRI of the brain showed acute subcentimeter left corona radiata infarct as well as old left right MCA territory infarct. Old small cerebellar infarcts. Small area of left occipital lobe encephalomalacia. Echocardiogram with ejection fraction 88% grade 1 diastolic dysfunction. 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 Patient did not receive TPA. Neurology follow-up currently maintained on aspirin therapy as well as the addition of Plavix. Subcutaneous Lovenox for DVT prophylaxis. Physical therapy evaluation completed with recommendations of physical medicine rehabilitation consult.     Total: 0=NIH  Past Medical History  Past Medical History  Diagnosis Date  . Hypertension   . Bipolar 1 disorder ([place])   . Incontinence   . Mental disorder   . Depression   . Anxiety   . Blood transfusion 1970's  . Shortness of breath     has had a recent increase in episodes of shortness of breath and hyperventilating  . Stroke [facility]) 2009-2010    "have had 3 strokes"; denies residual  . BPH (benign prostatic hyperplasia)   . H/O acute renal failure 08/2011    severe w/hematuria  . Cocaine abuse     Family History  family history includes Coronary artery disease in his father.  Prior Rehab/Hospitalizations: No previous rehab.  Has the patient had major surgery during 100 days prior to admission? No  Current Medications   Current facility-administered  medications:  .  acetaminophen (TYLENOL) tablet 650 mg, 650 mg, Oral, Q4H PRN **OR** acetaminophen (TYLENOL) suppository 650 mg, 650 mg, Rectal, Q4H PRN, Milagros Loll, MD .  aspirin EC tablet 81 mg, 81 mg, Oral, Daily, Milagros Loll, MD, 81 mg at 11/15/15 1000 .  atorvastatin (LIPITOR) tablet 40 mg, 40 mg, Oral, q1800, Milagros Loll, MD, 40 mg at 11/14/15 1825 .  cholecalciferol (VITAMIN D) tablet 2,000 Units, 2,000 Units, Oral, Daily, Milagros Loll, MD, 2,000 Units at 11/15/15 1000 .  ciprofloxacin (CIPRO) tablet 500 mg, 500 mg, Oral, BID, Iline Oven, MD, 500 mg at 11/15/15 0932 .  clopidogrel (PLAVIX) tablet 75 mg, 75 mg, Oral, Daily, Juliet Rude, MD, 75 mg at 11/15/15 1000 .  enoxaparin (LOVENOX) injection 40 mg, 40 mg,  Subcutaneous, Q24H, Milagros Loll, MD, 40 mg at 11/14/15 2139 .  hydrALAZINE (APRESOLINE) injection 5 mg, 5 mg, Intravenous, Q6H PRN, Milagros Loll, MD, 5 mg at 11/12/15 2220 .  lisinopril (PRINIVIL,ZESTRIL) tablet 40 mg, 40 mg, Oral, Daily, Iline Oven, MD, 40 mg at 11/15/15 1000 .  senna-docusate (Senokot-S) tablet 1 tablet, 1 tablet, Oral, QHS PRN, Milagros Loll, MD .  verapamil (CALAN-SR) CR tablet 240 mg, 240 mg, Oral, QHS, Iline Oven, MD  Patients Current Diet: Diet Heart Room service appropriate?: Yes; Fluid consistency:: Thin Diet - low sodium heart healthy  Precautions / Restrictions Precautions Precautions: Fall Restrictions Weight Bearing Restrictions: No   Has the patient had 2 or more falls or a fall with injury in the past year?No  Prior Activity Level Limited Community (1-2x/wk): Went out 1 X a week.  sister drives.  Home Assistive Devices / Equipment Home Assistive Devices/Equipment: None Home Equipment: Cane - single point, Shower seat  Prior Device Use: Indicate devices/aids used by the patient prior to current illness, exacerbation or injury? None  Prior Functional Level Prior Function Level of Independence: Needs assistance Gait / Transfers Assistance Needed: Pt reports he did not use a cane for mobility at home PTA. ADL's / Homemaking Assistance Needed: Sister assisting with getting pt into tub PTA but pt was bathing and dressing. Sister does cooking/cleaning. Comments: Per sister, patient did not use a device such as a cane or walker.  Self Care: Did the patient need help bathing, dressing, using the toilet or eating?  Independent  Indoor Mobility: Did the patient need assistance with walking from room to room (with or without device)? Independent  Stairs: Did the patient need assistance with internal or external stairs (with or without device)? Independent  Functional Cognition: Did the patient need help planning regular tasks such  as shopping or remembering to take medications? Independent  Current Functional Level Cognition  Overall Cognitive Status: No family/caregiver present to determine baseline cognitive functioning Difficult to assess due to:  (Pt avoiding some orientation questions.) Current Attention Level: Selective Orientation Level: Oriented X4 General Comments: Pt very fidgety and requires cues to redirect and focus.      Extremity Assessment (includes Sensation/Coordination)  Upper Extremity Assessment: Overall WFL for tasks assessed (slight decrease in gross motor coordination but [facility])  Lower Extremity Assessment: Defer to PT evaluation RLE Deficits / Details: BILATERALLY: pt with inconsistent MMT results. Appears to resist therapist initially with strength testing however "lets go" and does not complete test.     ADLs  Overall ADL's : Needs assistance/impaired Eating/Feeding: Set up, Sitting Grooming: Applying deodorant, Sitting, Set up, Supervision/safety Grooming Details (indicate cue  type and reason): Assist for balance in standing Upper Body Bathing: Supervision/ safety, Set up, Sitting, Standing Lower Body Bathing: Minimal assistance (sitting and standing) Upper Body Dressing : Minimal assistance, Sitting, Standing Upper Body Dressing Details (indicate cue type and reason): to don hospital gown Lower Body Dressing: Supervision/safety, Sitting/lateral leans Lower Body Dressing Details (indicate cue type and reason): doffed socks Toilet Transfer: Minimal assistance, Ambulation (sit to stand from bed and chair) Toilet Transfer Details (indicate cue type and reason): Simulated by transfer from EOB to chair. Pt incontinent PTA but sister reports he was able to clean himself independently. Toileting- Clothing Manipulation and Hygiene: Moderate assistance, Sit to/from stand Toileting - Clothing Manipulation Details (indicate cue type and reason): for toilet hygiene Functional mobility during ADLs:   (Min assist for ambulation; Min guard for sit to stand) General ADL Comments: Pt reported he was lightheaded in session. Talked about using long sponge for LB bathing.    Mobility  Overal bed mobility: Needs Assistance Bed Mobility: Supine to Sit, Sit to Supine Supine to sit: Supervision Sit to supine: Supervision General bed mobility comments: Pt uses bed rail to come to sitting and uses bed rails to scoot to [facility].    Transfers  Overall transfer level: Needs assistance Equipment used: None Transfers: Sit to/from Stand Sit to Stand: Min guard General transfer comment: Min instability noted w/ standing and on first sit>stand, pt requests to sit down because he "doesn't feel right".     Ambulation / Gait / Stairs / Wheelchair Mobility  Ambulation/Gait Ambulation/Gait assistance: Museum/gallery curator (Feet): 40 Feet Assistive device: None Gait Pattern/deviations: Step-through pattern, Decreased stride length, Staggering left, Staggering right General Gait Details: Unsteady gait requiring min assist to steady.  Pt requesting to sit after ambulating just 20 ft which he reports is due to fatigue. Gait velocity interpretation: Below normal speed for age/gender    Posture / Balance Dynamic Sitting Balance Sitting balance - Comments: Initially requiring 1 UE support to maintain sitting balance however at end of EOB activity raised both hands up and was able to maintain for a short time (<5 seconds). Balance Overall balance assessment: Needs assistance Sitting-balance support: No upper extremity supported, Feet supported Sitting balance-Leahy Scale: Good Sitting balance - Comments: Initially requiring 1 UE support to maintain sitting balance however at end of EOB activity raised both hands up and was able to maintain for a short time (<5 seconds). Standing balance support: No upper extremity supported, During functional activity Standing balance-Leahy Scale: Fair Standing balance  comment: min instabilility noted w/ static standing.    Special needs/care consideration BiPAP/CPAP No CPM No Continuous Drip IV No Dialysis No         Life Vest No Oxygen No Special Bed No Trach Size No Wound Vac (area) No     Skin Rough skin                            Bowel mgmt: Last BM 11/13/15 with incontinence at times Bladder mgmt: Condom catheter with incontinence at times Diabetic mgmt No    Previous Home Environment Living Arrangements: Other relatives (sister) Available Help at Discharge: Family, Available 24 hours/day Type of Home: House Home Layout: One level Home Access: Stairs to enter [employer] of Steps: 2 Bathroom Shower/Tub: Chiropodist: Standard Bathroom Accessibility: Yes How Accessible: Accessible via walker [place]: No  Discharge Living Setting Plans for Discharge Living Setting: Patient's  home, House, Lives with (comment) (Lives with sister, niece, nephew) Type of Home at Discharge: House Discharge Home Layout: One level Discharge Home Access: Ramped entrance Does the patient have any problems obtaining your medications?: No  Social/Family/Support Systems Patient Roles: Parent, Other (Comment) (Has a sister, daughter, niece, nephew) Contact Information: Wyn Quaker - sister Anticipated Caregiver: Sister Anticipated Caregiver's Contact Information: Gay Filler - sister - [phone] Ability/Limitations of Caregiver: Sister and extended family can assist Caregiver Availability: 24/7 Discharge Plan Discussed with Primary Caregiver: Yes Is Caregiver In Agreement with Plan?: Yes Does Caregiver/Family have Issues with Lodging/Transportation while Pt is in Rehab?: No  Goals/Additional Needs Patient/Family Goal for Rehab: PT/OT mod I and supervision, ST supervision and min assist goals Expected length of stay: 7-10 days Cultural Considerations: [place] Needs: Heart diet, thin liquids Equipment  Needs: TBD Pt/Family Agrees to Admission and willing to participate: Yes Program Orientation Provided & Reviewed with Pt/Caregiver Including Roles  & Responsibilities: Yes  Decrease burden of Care through IP rehab admission: N/A  Possible need for SNF placement upon discharge: Not anticipated  Patient Condition: This patient's condition remains as documented in the consult dated 11/14/15, in which the Rehabilitation Physician determined and documented that the patient's condition is appropriate for intensive rehabilitative care in an inpatient rehabilitation facility. Will admit to inpatient rehab today.  Preadmission Screen Completed By:  Retta Diones, 11/15/2015 2:09 PM ______________________________________________________________________   Discussed status with Dr. Naaman Plummer on 11/15/15 at 1409 and received telephone approval for admission today.  Admission Coordinator:  Retta Diones, time1409/Date04/18/17

## 2015-11-15 NOTE — Progress Notes (Signed)
Patient ID: Dominic HoughRonald E Whelpley, male   DOB: [birth year], 65 y.o.   MRN: [id number] Patient arrived from [facility] with RN and NT via hospital bed with patient belongings. Patient oriented to room, rehab process, rehab schedule, fall prevention plan, rehab safety plan, health resource notebook, and nurse call bell system. Patient has no complaints of pain at this time. Patients dinner order has been placed with nutrition.

## 2015-11-15 NOTE — Interval H&P Note (Signed)
Dominic Pineda was admitted today to Inpatient Rehabilitation with the diagnosis of left corona radiata infarct (prior right CVA).  The patient's history has been reviewed, patient examined, and there is no change in status.  Patient continues to be appropriate for intensive inpatient rehabilitation.  I have reviewed the patient's chart and labs.  Questions were answered to the patient's satisfaction. The PAPE has been reviewed and assessment remains appropriate.  SWARTZ,ZACHARY T 11/15/2015, 6:17 PM

## 2015-11-15 NOTE — Progress Notes (Signed)
Subjective: Yesterday afternoon, patient expressed that he had the fleeting thought that he could jump out of the window.  At the time, he denied SI or intent.  He had a hopeful outlook on the future.  This morning, he also denies SI or HI.  He denies weakness, numbness, or tingling.  Objective: Vital signs in last 24 hours: Filed Vitals:   11/14/15 2120 11/15/15 0115 11/15/15 0146 11/15/15 0513  BP: 169/78 178/91 190/73 189/83  Pulse: 69 74 72 69  Temp: 98 F (36.7 C) 97.8 F (36.6 C) 98.2 F (36.8 C) 97.5 F (36.4 C)  TempSrc: Oral Oral Oral Oral  Resp: 18 18 18 18   Height:      Weight:      SpO2: 99% 100% 100% 100%   Weight change:   Intake/Output Summary (Last 24 hours) at 11/15/15 0801 Last data filed at 11/15/15 0513  Gross per 24 hour  Intake    600 ml  Output   1110 ml  Net   -510 ml   Physical Exam  Constitutional: He is oriented to person, place, and time.  Morbidly obese male, lying in bed, conversant, NAD.  HENT:  Head: Normocephalic and atraumatic.  Eyes: EOM are normal.  Neck: No tracheal deviation present.  Cardiovascular: Normal rate and regular rhythm.   Heart sounds distant.  No murmurs appreciated.  Pulmonary/Chest: Effort normal. No stridor. No respiratory distress. He has no wheezes.  Lungs clear in anterior zones.  Abdominal: Soft. There is no rebound and no guarding.  Obese. Nontender to palpation.  Musculoskeletal: He exhibits no edema.  Neurological: He is alert and oriented to person, place, and time.  CNII-XII intact.  Spontaneous movement of all four extremities.  Skin: Skin is warm and dry.    Lab Results: Basic Metabolic Panel:  Recent Labs Lab 11/12/15 0541 11/14/15 0707  NA 142 139  K 3.3* 3.7  CL 107 102  CO2 23 29  GLUCOSE 108* 98  BUN 12 13  CREATININE 1.01 1.24  CALCIUM 8.7* 8.8*   Liver Function Tests:  Recent Labs Lab 11/11/15 1750 11/12/15 0541  AST 29 10*  ALT 11* 8*  ALKPHOS 105 96  BILITOT 1.7* 1.0    PROT 6.7 6.5  ALBUMIN 3.5 3.2*   CBC:  Recent Labs Lab 11/11/15 1750 11/11/15 1810 11/12/15 0541  WBC 12.5*  --  14.2*  NEUTROABS 4.9  --   --   HGB 13.8 15.0 12.8*  HCT 40.5 44.0 37.6*  MCV 84.0  --  84.1  PLT 251  --  211   CBG:  Recent Labs Lab 11/11/15 1742  GLUCAP 181*   Fasting Lipid Panel:  Recent Labs Lab 11/12/15 0541  CHOL 209*  HDL 35*  LDLCALC 155*  TRIG 93  CHOLHDL 6.0   Coagulation:  Recent Labs Lab 11/11/15 1750  LABPROT 14.6  INR 1.12   Urine Drug Screen: Drugs of Abuse     Component Value Date/Time   LABOPIA NONE DETECTED 11/11/2015 1825   COCAINSCRNUR NONE DETECTED 11/11/2015 1825   LABBENZ NONE DETECTED 11/11/2015 1825   AMPHETMU NONE DETECTED 11/11/2015 1825   THCU NONE DETECTED 11/11/2015 1825   LABBARB NONE DETECTED 11/11/2015 1825    Alcohol Level:  Recent Labs Lab 11/11/15 1750  ETH <5   Urinalysis:  Recent Labs Lab 11/11/15 1825  COLORURINE AMBER*  LABSPEC 1.021  PHURINE 5.5  GLUCOSEU NEGATIVE  HGBUR SMALL*  BILIRUBINUR NEGATIVE  KETONESUR NEGATIVE  PROTEINUR  NEGATIVE  NITRITE POSITIVE*  LEUKOCYTESUR LARGE*   Misc. Labs:   Micro Results: Recent Results (from the past 240 hour(s))  Urine culture     Status: None   Collection Time: 11/11/15  8:46 PM  Result Value Ref Range Status   Specimen Description URINE, CLEAN CATCH  Final   Special Requests Normal  Final   Culture MULTIPLE SPECIES PRESENT, SUGGEST RECOLLECTION  Final   Report Status 11/13/2015 FINAL  Final  MRSA PCR Screening     Status: None   Collection Time: 11/12/15  2:11 PM  Result Value Ref Range Status   MRSA by PCR NEGATIVE NEGATIVE Final    Comment:        The GeneXpert MRSA Assay (FDA approved for NASAL specimens only), is one component of a comprehensive MRSA colonization surveillance program. It is not intended to diagnose MRSA infection nor to guide or monitor treatment for MRSA infections.    Studies/Results: No  results found. Medications: I have reviewed the patient's current medications. Scheduled Meds: . amLODipine  10 mg Oral Daily  . aspirin EC  81 mg Oral Daily  . atorvastatin  40 mg Oral q1800  . cholecalciferol  2,000 Units Oral Daily  . ciprofloxacin  500 mg Oral BID  . clopidogrel  75 mg Oral Daily  . enoxaparin (LOVENOX) injection  40 mg Subcutaneous Q24H  . lisinopril  40 mg Oral Daily   Continuous Infusions:  PRN Meds:.acetaminophen **OR** acetaminophen, hydrALAZINE, senna-docusate Assessment/Plan:  Mr. Houp is a 65yo with HTN, h/o crack cocaine abuse, multiple CVAs (last 2010) who presents with slurred speech and that earlier this afternoon.  Acute CVA of L corona radiata: Patient presented with transient focal neurologic symptoms in the setting of poorly-controlled HTN and multiple CVAs. UDS here negative. Patient reports compliance with his meds.  CT without new findings, with old R MCA encephalomalacia. MRI confirms acute CVA of L corona radiata.  Carotid US shows 1-39% on the right and 40-59%on the left. Echo with EF 60-65%, mild LVH, grade 1 diastolic dysfunction. MRA with diffuse intracranial atherosclerotic changes likely related to his drug abuse and stenoses in the right anterior circulation as noted above. A1c 5.5%. Patient will take ASA/Plavix for 3 months, then Plavix alone.  PT/OT recommending CIR.  Awaiting admission.  - Neurology following, appreciate recommendations - Lisinopril 40 mg daily - Verapamil 240 mg daily - Plavix 75 mg daily - Atorvastatin 40 mg - ASA 81 mg - PT/OT/SLP recs greatly appreciated -Telemetry - CIR pending  Acute cystitis: Patient with mild leukocytosis and UA suggestive of UTI without symptoms. He is s/p 1g CTX in the ED.  Given he is male, we will treat as complicated cystitis.   - UCx showing multiple species. - Ciprofloxacin 500 mg BID (4/14 - 4/20)  FEN/GI: HH DVT Ppx: Lovenox SQ Dispo: Discharge to CIR possibly today  The  patient does not have a current PCP (Provider Not In System) and does need an [facility] hospital follow-up appointment after discharge.  The patient does not have transportation limitations that hinder transportation to clinic appointments.  .Services Needed at time of discharge: Y = Yes, Blank = No PT:   OT:   RN:   Equipment:   Other:     LOS: 3 days   Jana Half, MD 11/15/2015, 8:01 AM

## 2015-11-15 NOTE — Progress Notes (Signed)
Rehab admissions - I have authorization for acute inpatient rehab admission for today.  I have medical clearance from attending MD.  Bed available and will admit to acute inpatient rehab today.  Call me for questions.  #[phone]

## 2015-11-15 NOTE — H&P (View-Only) (Signed)
Physical Medicine and Rehabilitation Admission H&P    Chief Complaint  Patient presents with  . Aphasia  : HPI: Dominic Pineda is a 65 y.o. right handed male with history of bipolar disorder, hypertension, CVA 2009 maintained on aspirin as well as prior cocaine use. Per chart review patient lives with sister and one level home with 2 steps to entry. He used a cane prior to admission. Presented 11/11/2015 with new onset of slurred speech and right-sided weakness.Urine drug screen negative. MRI of the brain showed acute subcentimeter left corona radiata infarct as well as old  right MCA territory infarct. Old small cerebellar infarcts. Small area of left occipital lobe encephalomalacia. Echocardiogram with ejection fraction 85% grade 1 diastolic dysfunction. 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 Patient did not receive TPA. Neurology follow-up currently maintained on aspirin therapy as well as the addition of Plavix. Subcutaneous Lovenox for DVT prophylaxis. Urinalysis 11/11/2015 positive nitrite with culture multiple species present and mild leukocytosis 14,200 and placed on Cipro empirically for 15 2017. PhysicalAnd occupational therapy evaluations completed with recommendations of physical medicine rehabilitation consult.Patient was admitted for a comprehensive rehabilitation program  Review of Systems  Constitutional: Negative for fever and chills.  HENT: Negative for hearing loss.   Eyes: Negative for blurred vision and double vision.  Respiratory: Negative for cough and shortness of breath.   Cardiovascular: Negative for chest pain, palpitations and leg swelling.  Gastrointestinal: Positive for constipation. Negative for nausea and vomiting.    Genitourinary: Negative for dysuria and hematuria.  Musculoskeletal: Positive for myalgias.  Skin: Negative for rash.  Neurological: Positive for speech change and headaches. Negative for seizures, loss of consciousness and weakness.  Psychiatric/Behavioral:       Bipolar disorder   Past Medical History  Diagnosis Date  . Hypertension   . Bipolar 1 disorder ([place])   . Incontinence   . Mental disorder   . Depression   . Anxiety   . Blood transfusion 1970's  . Shortness of breath     has had a recent increase in episodes of shortness of breath and hyperventilating  . Stroke [facility]) 2009-2010    "have had 3 strokes"; denies residual  . BPH (benign prostatic hyperplasia)   . H/O acute renal failure 08/2011    severe w/hematuria  . Cocaine abuse    Past Surgical History  Procedure Laterality Date  . Incision and drainage of wound      right knee "got piece of wire in it while mowing"  . Inguinal hernia repair      left  . Tonsillectomy      "as a child"   Family History  Problem Relation Age of Onset  . Coronary artery disease Father    Social History:  reports that he quit smoking about 29 years ago. His smoking use included Cigarettes. He has a 18 pack-year smoking history. He has never used smokeless tobacco. He reports that he drinks alcohol. He reports that he uses illicit drugs ("Crack" cocaine and Marijuana). Allergies:  Allergies  Allergen Reactions  . Codeine Nausea Only   Medications Prior to Admission  Medication Sig Dispense Refill  . Cholecalciferol (VITAMIN D) 2000 units CAPS Take 2,000 Units by mouth daily.    Marland Kitchen  simvastatin (ZOCOR) 10 MG tablet Take 10 mg by mouth daily at 6 PM.    . verapamil (CALAN-SR) 240 MG CR tablet Take 1 tablet (240 mg total) by mouth at bedtime. (Patient taking differently: Take 240 mg by mouth every morning. ) 30 tablet 0  . [DISCONTINUED] aspirin EC 81 MG tablet Take 81 mg by mouth daily.    . [DISCONTINUED] lisinopril  (PRINIVIL,ZESTRIL) 10 MG tablet Take 10 mg by mouth daily.      Home: Home Living Family/patient expects to be discharged to:: Private residence Living Arrangements: Other relatives (sister) Available Help at Discharge: Family, Available 24 hours/day Type of Home: House Home Access: Stairs to enter [employer] of Steps: 2 Muniz: One level Bathroom Shower/Tub: Chiropodist: Standard Bathroom Accessibility: Yes Home Equipment: Cane - single point, Industrial/product designer History: Prior Function Level of Independence: Needs assistance Gait / Transfers Assistance Needed: Pt reports he did not use a cane for mobility at home PTA. ADL's / Homemaking Assistance Needed: Sister assisting with getting pt into tub PTA but pt was bathing and dressing. Sister does cooking/cleaning. Comments: Per sister, patient did not use a device such as a cane or walker.  Functional Status:  Mobility: Bed Mobility Overal bed mobility: Needs Assistance Bed Mobility: Supine to Sit, Sit to Supine Supine to sit: Supervision Sit to supine: Supervision General bed mobility comments: Pt uses bed rail to come to sitting and uses bed rails to scoot to [facility]. Transfers Overall transfer level: Needs assistance Equipment used: None Transfers: Sit to/from Stand Sit to Stand: Min guard General transfer comment: Min instability noted w/ standing and on first sit>stand, pt requests to sit down because he "doesn't feel right".  Ambulation/Gait Ambulation/Gait assistance: Min assist Ambulation Distance (Feet): 40 Feet Assistive device: None Gait Pattern/deviations: Step-through pattern, Decreased stride length, Staggering left, Staggering right General Gait Details: Unsteady gait requiring min assist to steady.  Pt requesting to sit after ambulating just 20 ft which he reports is due to fatigue. Gait velocity interpretation: Below normal speed for age/gender    ADL: ADL Overall  ADL's : Needs assistance/impaired Eating/Feeding: Set up, Sitting Grooming: Applying deodorant, Sitting, Set up, Supervision/safety Grooming Details (indicate cue type and reason): Assist for balance in standing Upper Body Bathing: Supervision/ safety, Set up, Sitting, Standing Lower Body Bathing: Minimal assistance (sitting and standing) Upper Body Dressing : Minimal assistance, Sitting, Standing Upper Body Dressing Details (indicate cue type and reason): to don hospital gown Lower Body Dressing: Supervision/safety, Sitting/lateral leans Lower Body Dressing Details (indicate cue type and reason): doffed socks Toilet Transfer: Minimal assistance, Ambulation (sit to stand from bed and chair) Toilet Transfer Details (indicate cue type and reason): Simulated by transfer from EOB to chair. Pt incontinent PTA but sister reports he was able to clean himself independently. Toileting- Clothing Manipulation and Hygiene: Moderate assistance, Sit to/from stand Toileting - Clothing Manipulation Details (indicate cue type and reason): for toilet hygiene Functional mobility during ADLs:  (Min assist for ambulation; Min guard for sit to stand) General ADL Comments: Pt reported he was lightheaded in session. Talked about using long sponge for LB bathing.  Cognition: Cognition Overall Cognitive Status: No family/caregiver present to determine baseline cognitive functioning Orientation Level: Oriented X4 Cognition Arousal/Alertness: Awake/alert Behavior During Therapy: WFL for tasks assessed/performed Overall Cognitive Status: No family/caregiver present to determine baseline cognitive functioning Area of Impairment: Orientation, Attention Orientation Level: Disoriented to, Situation (says he is here for UTI and high  BP) Current Attention Level: Selective General Comments: Pt very fidgety and requires cues to redirect and focus.   Difficult to assess due to:  (Pt avoiding some orientation  questions.)  Physical Exam: Blood pressure 166/70, pulse 67, temperature 98.7 F (37.1 C), temperature source Oral, resp. rate 18, height '5\' 10"'$  (1.778 m), weight 127.189 kg (280 lb 6.4 oz), SpO2 97 %. Physical Exam Constitutional: He is oriented to person, place, and time. He appears well-developed.  Obese  HENT:  Head: Normocephalic and atraumatic.  Eyes: Conjunctivae and EOM are normal.  Neck: Normal range of motion. Neck supple. No thyromegaly present.  Cardiovascular: Normal rate and regular rhythm.  Respiratory: Effort normal and breath sounds normal. No respiratory distress.  GI: Soft. Bowel sounds are normal. He exhibits no distension.  Musculoskeletal: He exhibits no edema or tenderness.  Neurological: He is alert and oriented to person, place, and time.  He does make good eye contact with examiner.  Speech is dysarthric. Left central 7. Good tongue control Sensation intact to light touch and pain in all 4 limbs. DTRs symmetric and 2+ Motor: R UE/RLE: 4+/5 proximal to distal  LUE: 4 to 4+/5 deltoid, bicep, tricep, wrist, finger. LLE: 4+/5 HF, KE, ADF/PF.  Cognitively he displays reasonable insight and awareness. Memory is functional Skin: Skin is warm and dry.  Psychiatric: He has a normal mood and affect. His behavior is normal   Results for orders placed or performed during the hospital encounter of 11/11/15 (from the past 48 hour(s))  Basic metabolic panel     Status: Abnormal   Collection Time: 11/14/15  7:07 AM  Result Value Ref Range   Sodium 139 135 - 145 mmol/L   Potassium 3.7 3.5 - 5.1 mmol/L   Chloride 102 101 - 111 mmol/L   CO2 29 22 - 32 mmol/L   Glucose, Bld 98 65 - 99 mg/dL   BUN 13 6 - 20 mg/dL   Creatinine, Ser 1.24 0.61 - 1.24 mg/dL   Calcium 8.8 (L) 8.9 - 10.3 mg/dL   GFR calc non Af Amer 59 (L) >60 mL/min   GFR calc Af Amer >60 >60 mL/min    Comment: (NOTE) The eGFR has been calculated using the CKD EPI equation. This calculation has not been  validated in all clinical situations. eGFR's persistently <60 mL/min signify possible Chronic Kidney Disease.    Anion gap 8 5 - 15  Basic metabolic panel     Status: Abnormal   Collection Time: 11/15/15  7:42 AM  Result Value Ref Range   Sodium 140 135 - 145 mmol/L   Potassium 3.6 3.5 - 5.1 mmol/L   Chloride 105 101 - 111 mmol/L   CO2 27 22 - 32 mmol/L   Glucose, Bld 100 (H) 65 - 99 mg/dL   BUN 11 6 - 20 mg/dL   Creatinine, Ser 1.07 0.61 - 1.24 mg/dL   Calcium 8.8 (L) 8.9 - 10.3 mg/dL   GFR calc non Af Amer >60 >60 mL/min   GFR calc Af Amer >60 >60 mL/min    Comment: (NOTE) The eGFR has been calculated using the CKD EPI equation. This calculation has not been validated in all clinical situations. eGFR's persistently <60 mL/min signify possible Chronic Kidney Disease.    Anion gap 8 5 - 15   No results found.     Medical Problem List and Plan: 1.  Right sided weakness and dysarthria secondary to left corona radiata infarct as well as remote right MCA  territory infarct 2.  DVT Prophylaxis/Anticoagulation: Subcutaneous Lovenox. Monitor platelet counts and any signs of bleeding 3. Pain Management: Tylenol as needed 4. Mood/bipolar disorder. No present medications. Plan to discuss with sister. 5. Neuropsych: This patient is capable of making decisions on his own behalf. 6. Skin/Wound Care: Routine skin checks 7. Fluids/Electrolytes/Nutrition: Routine I&O with follow-up chemistries 8. Hypertension. Lisinopril 40 mg daily. Monitor with increased mobility 9. Hyperlipidemia. Lipitor 10. Remote history of cocaine abuse. Urine drug screen negative. 11. Positive urine study 11/11/2015 with mild leukocytosis. Cipro initiated 11/12/2015  Post Admission Physician Evaluation: 1. Functional deficits secondary  to left corona radiata infarct/ prior right MCA infarct. 2. Patient is admitted to receive collaborative, interdisciplinary care between the physiatrist, rehab nursing staff, and  therapy team. 3. Patient's level of medical complexity and substantial therapy needs in context of that medical necessity cannot be provided at a lesser intensity of care such as a SNF. 4. Patient has experienced substantial functional loss from his/her baseline which was documented above under the "Functional History" and "Functional Status" headings.  Judging by the patient's diagnosis, physical exam, and functional history, the patient has potential for functional progress which will result in measurable gains while on inpatient rehab.  These gains will be of substantial and practical use upon discharge  in facilitating mobility and self-care at the household level. 5. Physiatrist will provide 24 hour management of medical needs as well as oversight of the therapy plan/treatment and provide guidance as appropriate regarding the interaction of the two. 6. 24 hour rehab nursing will assist with bladder management, bowel management, safety, skin/wound care, disease management, medication administration, pain management and patient education  and help integrate therapy concepts, techniques,education, etc. 7. PT will assess and treat for/with: Lower extremity strength, range of motion, stamina, balance, functional mobility, safety, adaptive techniques and equipment, NMR, ego support, community reintegration.   Goals are: mod I. 8. OT will assess and treat for/with: ADL's, functional mobility, safety, upper extremity strength, adaptive techniques and equipment, NMR, education, ego support, community reintegration.   Goals are: mod I. Therapy may proceed with showering this patient. 9. SLP will assess and treat for/with: speech,communication.  Goals are: mod I. 10. Case Management and Social Worker will assess and treat for psychological issues and discharge planning. 11. Team conference will be held weekly to assess progress toward goals and to determine barriers to discharge. 12. Patient will receive at  least 3 hours of therapy per day at least 5 days per week. 13. ELOS: 7 days       14. Prognosis:  excellent     Meredith Staggers, MD, [place] [facility] 11/15/2015   11/15/2015

## 2015-11-15 NOTE — Progress Notes (Signed)
Date: 11/15/2015  Patient name: Dominic Pineda  Medical record number: [id number]  Date of birth: 28-Nov-1950   This patient has been seen and the plan of care was discussed with the house staff. Please see their note for complete details. I concur with their findings with the following additions/corrections: [name] was seen on AM rounds. No complaints. For CIR today.  Burns Spain, MD 11/15/2015, 12:34 PM

## 2015-11-15 NOTE — Discharge Instructions (Signed)
1. Increase Lisinopril to 40 mg daily. 2. Take your Aspirin every day for the next 3 months.  Take it with your Plavix.  After 3 months, stop the Aspirin, but continue Plavix. 3. Start Clopidogrel (Plavix) 1 pill daily. 4. Follow up with Neurology.

## 2015-11-15 NOTE — Care Management Note (Signed)
Case Management Note  Patient Details  Name: Wyonia HoughRonald E Gilmartin MRN: [id number] Date of Birth: Nov 26, 1950  Subjective/Objective:                    Action/Plan: Patient discharging to CIR today. No further needs per CM.   Expected Discharge Date:                  Expected Discharge Plan:  IP Rehab Facility  In-House Referral:     Discharge planning Services     Post Acute Care Choice:    Choice offered to:     DME Arranged:    DME Agency:     HH Arranged:    HH Agency:     Status of Service:  Completed, signed off  Medicare Important Message Given:  Yes Date Medicare IM Given:    Medicare IM give by:    Date Additional Medicare IM Given:    Additional Medicare Important Message give by:     If discussed at Long Length of Stay Meetings, dates discussed:    Additional Comments:  Kermit BaloKelli F Zyion Leidner, RN 11/15/2015, 2:28 PM

## 2015-11-15 NOTE — Progress Notes (Signed)
Pt to be transferred to rehab rm [id number]. Report called in to nurse.

## 2015-11-15 NOTE — Care Management Important Message (Signed)
Important Message  Patient Details  Name: Dominic HoughRonald E Jorden MRN: [id number] Date of Birth: [birth year]   Medicare Important Message Given:  Yes    Oralia RudMegan P Sarp Vernier 11/15/2015, 12:18 PM

## 2015-11-15 NOTE — Progress Notes (Signed)
Rehab admissions - I tried to visit with patient earlier today, but he was too sleepy.  I called his sister.  Sister is agreeable to inpatient rehab admission.  Patient has [employer] medicare insurance and we will need approval.  I have called and faxed information to insurance carrier.  Will await call back from insurance case manager.  Call me for questions.  #[phone]

## 2015-11-15 NOTE — Telephone Encounter (Signed)
Pt needs TOC pt scheduled for 11/29/15 with dr r. patel

## 2015-11-15 NOTE — Consult Note (Signed)
CH attempted to visit pt; pt was sleeping; if spiritual support still required, please place a follow-up consult. 10:43 AM

## 2015-11-15 NOTE — Progress Notes (Signed)
Rehab admissions - I have received authorization for [facility].  Bed available and will plan to admit to acute inpatient rehab today.  Call me for questions.  #[phone]

## 2015-11-15 NOTE — Interval H&P Note (Signed)
Dominic Pineda was admitted today to Inpatient Rehabilitation with the diagnosis of left corona radiata infarct, prior right CVA.  The patient's history has been reviewed, patient examined, and there is no change in status.  Patient continues to be appropriate for intensive inpatient rehabilitation.  I have reviewed the patient's chart and labs.  Questions were answered to the patient's satisfaction. The PAPE has been reviewed and assessment remains appropriate.  [name] T 11/15/2015, 4:49 PM

## 2015-11-16 ENCOUNTER — Inpatient Hospital Stay (HOSPITAL_COMMUNITY): Payer: Commercial Managed Care - HMO | Admitting: Speech Pathology

## 2015-11-16 ENCOUNTER — Inpatient Hospital Stay (HOSPITAL_COMMUNITY): Payer: Commercial Managed Care - HMO | Admitting: Occupational Therapy

## 2015-11-16 ENCOUNTER — Inpatient Hospital Stay (HOSPITAL_COMMUNITY): Payer: Commercial Managed Care - HMO

## 2015-11-16 LAB — COMPREHENSIVE METABOLIC PANEL
ALT: 13 U/L — AB (ref 17–63)
AST: 20 U/L (ref 15–41)
Albumin: 3.1 g/dL — ABNORMAL LOW (ref 3.5–5.0)
Alkaline Phosphatase: 97 U/L (ref 38–126)
Anion gap: 8 (ref 5–15)
BILIRUBIN TOTAL: 1.1 mg/dL (ref 0.3–1.2)
BUN: 12 mg/dL (ref 6–20)
CO2: 27 mmol/L (ref 22–32)
CREATININE: 1.31 mg/dL — AB (ref 0.61–1.24)
Calcium: 8.9 mg/dL (ref 8.9–10.3)
Chloride: 103 mmol/L (ref 101–111)
GFR calc Af Amer: >60 mL/min (ref >60)
GFR, EST NON AFRICAN AMERICAN: 55 mL/min — AB (ref >60)
Glucose, Bld: 164 mg/dL — ABNORMAL HIGH (ref 65–99)
POTASSIUM: 4.1 mmol/L (ref 3.5–5.1)
Sodium: 138 mmol/L (ref 135–145)
TOTAL PROTEIN: 6.3 g/dL — AB (ref 6.5–8.1)

## 2015-11-16 LAB — CBC WITH DIFFERENTIAL/PLATELET
Basophils Absolute: 0 10*3/uL (ref 0.0–0.1)
Basophils Relative: 0 %
Eosinophils Absolute: 0.2 10*3/uL (ref 0.0–0.7)
Eosinophils Relative: 2 %
HCT: 39.8 % (ref 39.0–52.0)
Hemoglobin: 13.2 g/dL (ref 13.0–17.0)
LYMPHS ABS: 6.5 10*3/uL — AB (ref 0.7–4.0)
Lymphocytes Relative: 59 %
MCH: 28.5 pg (ref 26.0–34.0)
MCHC: 33.2 g/dL (ref 30.0–36.0)
MCV: 86 fL (ref 78.0–100.0)
MONOS PCT: 5 %
Monocytes Absolute: 0.6 10*3/uL (ref 0.1–1.0)
Neutro Abs: 3.8 10*3/uL (ref 1.7–7.7)
Neutrophils Relative %: 34 %
Platelets: 226 10*3/uL (ref 150–400)
RBC: 4.63 MIL/uL (ref 4.22–5.81)
RDW: 14.4 % (ref 11.5–15.5)
WBC: 11.1 10*3/uL — AB (ref 4.0–10.5)

## 2015-11-16 NOTE — Progress Notes (Signed)
65 y.o. right handed male with history of bipolar disorder, hypertension, CVA 2009 maintained on aspirin as well as prior cocaine use. Per chart review patient lives with sister and one level home with 2 steps to entry. He used a cane prior to admission. Presented 11/11/2015 with new onset of slurred speech and right-sided weakness.Urine drug screen negative. MRI of the brain showed acute subcentimeter left corona radiata infarct as well as old right MCA territory infarct. Old small cerebellar infarcts. Small area of left occipital lobe encephalomalacia. Echocardiogram with ejection fraction 65% grade 1 diastolic dysfunction. 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 Carotid Dopplers with left 40-59% ICA stenosis  Subjective/Complaints: Feels tired this am but otherwise no issues ROS-No CP or SOB, feels week legs and arms, good appetite Objective: Vital Signs: Blood pressure 171/76, pulse 78, temperature 97.8 F (36.6 C), temperature source Oral, resp. rate 18, height 5\' 10"  (1.778 m), weight 127.506 kg (281 lb 1.6 oz), SpO2 96 %. No results found. Results for orders placed or performed during the hospital encounter of 11/15/15 (from the past 72 hour(s))  CBC WITH DIFFERENTIAL     Status: Abnormal   Collection Time: 11/16/15  4:48 AM  Result Value Ref Range   WBC 11.1 (H) 4.0 - 10.5 K/uL   RBC 4.63 4.22 - 5.81 MIL/uL   Hemoglobin 13.2 13.0 - 17.0 g/dL   HCT 11/18/15 85.8 - 85.0 %   MCV 86.0 78.0 - 100.0 fL   MCH 28.5 26.0 - 34.0 pg   MCHC 33.2 30.0 - 36.0 g/dL   RDW 27.7 41.2 - 87.8 %   Platelets 226 150 - 400 K/uL   Neutrophils Relative % 34 %   Lymphocytes Relative 59 %   Monocytes Relative 5 %   Eosinophils Relative 2 %   Basophils Relative 0 %   Neutro Abs 3.8 1.7 - 7.7 K/uL   Lymphs Abs  6.5 (H) 0.7 - 4.0 K/uL   Monocytes Absolute 0.6 0.1 - 1.0 K/uL   Eosinophils Absolute 0.2 0.0 - 0.7 K/uL   Basophils Absolute 0.0 0.0 - 0.1 K/uL   WBC Morphology ATYPICAL LYMPHOCYTES   Comprehensive metabolic panel     Status: Abnormal   Collection Time: 11/16/15  4:48 AM  Result Value Ref Range   Sodium 138 135 - 145 mmol/L   Potassium 4.1 3.5 - 5.1 mmol/L   Chloride 103 101 - 111 mmol/L   CO2 27 22 - 32 mmol/L   Glucose, Bld 164 (H) 65 - 99 mg/dL   BUN 12 6 - 20 mg/dL   Creatinine, Ser 11/18/15 (H) 0.61 - 1.24 mg/dL   Calcium 8.9 8.9 - 7.20 mg/dL   Total Protein 6.3 (L) 6.5 - 8.1 g/dL   Albumin 3.1 (L) 3.5 - 5.0 g/dL   AST 20 15 - 41 U/L   ALT 13 (L) 17 - 63 U/L   Alkaline Phosphatase 97 38 - 126 U/L   Total Bilirubin 1.1 0.3 - 1.2 mg/dL   GFR calc non Af Amer 55 (L) >60 mL/min   GFR calc Af Amer >60 >60 mL/min    Comment: (NOTE) The eGFR has been calculated using the CKD EPI equation. This calculation has not been validated in all clinical situations. eGFR's persistently <60 mL/min signify possible Chronic Kidney Disease.  Anion gap 8 5 - 15     HEENT: normal Cardio: RRR Resp: CTA B/L and unlabored GI: BS positive and NT, ND Extremity:  No Edema Skin:   Intact Neuro: Lethargic, Normal Sensory, Abnormal Motor right Delt bi tri grip 4/5, R HF 4/5 R ADF 4/5 , left side 5/5, normal tone and Abnormal FMC Ataxic/ dec FMC Musc/Skel:  Other no pain with UE or LE ROM Gen NAD   Assessment/Plan: 1. 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 Physiatrist is providing close team supervision and 24 hour management of active medical problems listed below. Physiatrist and rehab team continue to assess barriers to discharge/monitor patient progress toward functional and medical goals. FIM:       Function - Toileting Toileting activity  did not occur: Safety/medical concerns           Function - Comprehension Comprehension: Auditory Comprehension assist level: Follows basic conversation/direction with no assist  Function - Expression Expression: Verbal Expression assist level: Expresses basic 90% of the time/requires cueing < 10% of the time.  Function - Social Interaction Social Interaction assist level: Interacts appropriately 90% of the time - Needs monitoring or encouragement for participation or interaction.  Function - Problem Solving Problem solving assist level: Solves basic 75 - 89% of the time/requires cueing 10 - 24% of the time  Function - Memory Memory assist level: More than reasonable amount of time Patient normally able to recall (first 3 days only): Current season, Location of own room, Staff names and faces, That he or she is in a hospital  Medical Problem List and Plan: 1.  Right sided weakness and dysarthria secondary to left corona radiata infarct as well as remote right MCA territory infarct- initiate CIR 2.  DVT Prophylaxis/Anticoagulation: Subcutaneous Lovenox. Monitor platelet counts and any signs of bleeding 3. Pain Management: Tylenol as needed 4. Mood/bipolar disorder. No present medications. Plan to discuss with sister. 5. Neuropsych: This patient is capable of making decisions on his own behalf. 6. Skin/Wound Care: Routine skin checks 7. Fluids/Electrolytes/Nutrition: Routine I&O , CMET with mild hypoalbuminemia,add prostat 8. Hypertension. Lisinopril 40 mg daily. Monitor with increased mobility 9. Hyperlipidemia. Lipitor 10. Remote history of cocaine abuse. Urine drug screen negative. 11. Positive urine study 11/11/2015 with mild leukocytosis. Cipro initiated 11/12/2015,UCx neg will d/c cipro 12.  Labs reviewed , mild leukocytosis no fever  LOS (Days) 1 A FACE TO FACE EVALUATION WAS PERFORMED  Monnie Anspach E 11/16/2015, 7:12 AM

## 2015-11-16 NOTE — Progress Notes (Signed)
Patient information reviewed and entered into eRehab system by Jashay Roddy, RN, CRRN, PPS Coordinator.  Information including medical coding and functional independence measure will be reviewed and updated through discharge.     Per nursing patient was given "Data Collection Information Summary for Patients in Inpatient Rehabilitation Facilities with attached "Privacy Act Statement-Health Care Records" upon admission.  

## 2015-11-16 NOTE — Evaluation (Signed)
Occupational Therapy Assessment and Plan  Patient Details  Name: Dominic Pineda MRN: [id number] Date of Birth: 05-10-1951  OT Diagnosis: abnormal posture, acute pain, cognitive deficits and muscle weakness (generalized) Rehab Potential: Rehab Potential (ACUTE ONLY): Good ELOS: 10-12 days   Today's Date: 11/16/2015 OT Individual Time: 0901-1001 OT Individual Time Calculation (min): 60 min     Problem List:  Patient Active Problem List   Diagnosis Date Noted  . CVA (cerebral vascular accident) (HCC) 11/15/2015  . Essential hypertension   . Hemiparesis affecting left side as late effect of stroke (HCC)   . Bipolar affective disorder in remission (HCC)   . Benign essential HTN   . History of CVA (cerebrovascular accident)   . Acute blood loss anemia   . AKI (acute kidney injury) (HCC)   . Acute cerebrovascular accident (CVA) (HCC) 11/12/2015  . Substance abuse   . Severe hypertension 11/11/2015  . Stroke (HCC)   . Urinary tract infectious disease   . Facial droop 10/30/2011  . CKD (chronic kidney disease) 10/30/2011  . UTI (lower urinary tract infection) 10/30/2011  . Obstructive uropathy 09/28/2011  . ARF (acute renal failure) (HCC) 09/23/2011  . Diarrhea 09/23/2011  . Nausea & vomiting 09/23/2011  . Hyperkalemia 09/23/2011  . Polyuria 09/23/2011  . Hypertensive emergency 09/23/2011  . Bipolar 1 disorder (HCC) 09/23/2011  . Shuffling gait 09/23/2011  . Weakness generalized 09/23/2011  . H/O cocaine abuse 09/23/2011  . Hypertension   . Incontinence     Past Medical History:  Past Medical History  Diagnosis Date  . Hypertension   . Bipolar 1 disorder (HCC)   . Incontinence   . Mental disorder   . Depression   . Anxiety   . Blood transfusion 1970's  . Shortness of breath     has had a recent increase in episodes of shortness of breath and hyperventilating  . Stroke [facility]) 2009-2010    "have had 3 strokes"; denies residual  . BPH (benign prostatic hyperplasia)    . H/O acute renal failure 08/2011    severe w/hematuria  . Cocaine abuse    Past Surgical History:  Past Surgical History  Procedure Laterality Date  . Incision and drainage of wound      right knee "got piece of wire in it while mowing"  . Inguinal hernia repair      left  . Tonsillectomy      "as a child"    Assessment & Plan Clinical Impression: Patient is a 65 y.o. year old male with recent admission to the hospital on 11/11/2015 with new onset of slurred speech and right-sided weakness.Urine drug screen negative. MRI of the brain showed acute subcentimeter left corona radiata infarct as well as old right MCA territory infarct. Old small cerebellar infarcts. Small area of left occipital lobe encephalomalacia .  Patient transferred to CIR on 11/15/2015 .    Patient currently requires mod with basic self-care skills secondary to muscle weakness, impaired timing and sequencing and abnormal tone, decreased initiation, decreased attention, decreased awareness, decreased problem solving, decreased safety awareness, decreased memory and delayed processing and decreased sitting balance, decreased standing balance, decreased postural control and decreased balance strategies.  Prior to hospitalization, patient could complete ADLs with modified independent .  Patient will benefit from skilled intervention to decrease level of assist with basic self-care skills prior to discharge home with care partner.  Anticipate patient will require 24 hour supervision and follow up home health.  OT - End of  Session Activity Tolerance: Decreased this session;Tolerates 10 - 20 min activity with multiple rests Endurance Deficit: Yes Endurance Deficit Description: Pt needing to sit down after standing less than 45 seconds during LB dressing tasks.  OT Assessment Rehab Potential (ACUTE ONLY): Good OT Patient demonstrates impairments in the following area(s): Balance;Cognition;Endurance;Motor;Safety OT Basic  ADL's Functional Problem(s): Grooming;Bathing;Dressing;Toileting OT Transfers Functional Problem(s): Toilet;Tub/Shower OT Additional Impairment(s): None OT Plan OT Intensity: Minimum of 1-2 x/day, 45 to 90 minutes OT Frequency: 5 out of 7 days OT Duration/Estimated Length of Stay: 10-12 days OT Treatment/Interventions: Balance/vestibular training;Cognitive remediation/compensation;Discharge planning;Functional mobility training;DME/adaptive equipment instruction;Neuromuscular re-education;Self Care/advanced ADL retraining;Pain management;Patient/family education;Therapeutic Activities;UE/LE Coordination activities;UE/LE Strength taining/ROM;Therapeutic Exercise;Psychosocial support;Community reintegration OT Self Feeding Anticipated Outcome(s): independent OT Basic Self-Care Anticipated Outcome(s): supervision OT Toileting Anticipated Outcome(s): supervision OT Bathroom Transfers Anticipated Outcome(s): supervision OT Recommendation Follow Up Recommendations: Home health OT Equipment Recommended: To be determined   Skilled Therapeutic Intervention Worked on selfcare re-training sit to stand shower level and at EOB during session.  Pt with decreased ability to reach his feet for washing or for doffing/donning gripper socks.  Decreased initiation of all bathing tasks, with pt needing max instructional cueing for thoroughness and sequencing of bath.  He was able to dress with less cueing but demonstrated overall limited endurance for standing tasks, only being able to tolerate standing for 45 seconds or less.  Pt with frequent grunting throughout session but reports no pain.  Pt left in recliner chair with safety belt in place.  He was able to state and demonstrate safe use of the call button as well.    OT Evaluation Precautions/Restrictions  Precautions Precautions: Fall Restrictions Weight Bearing Restrictions: No  Pain Pain Assessment Pain Assessment: Faces Faces Pain Scale: Hurts  little more Pain Type: Acute pain Pain Location: Flank Pain Orientation: Right Pain Descriptors / Indicators: Cramping Pain Intervention(s): Repositioned;Emotional support Home Living/Prior Functioning Home Living Family/patient expects to be discharged to:: Private residence Living Arrangements: Other relatives Available Help at Discharge: Family, Available 24 hours/day Type of Home: House Home Access: Stairs to enter [employer] of Steps: 2 Home Layout: One level Bathroom Shower/Tub: Associate Professor: Yes  Lives With: Family IADL History Homemaking Responsibilities: No Education: 1 year of college  Prior Function Level of Independence: Independent with basic ADLs Vocation: Retired Comments: Per sister, patient did not use a device such as a cane or walker. ADL  See Function Section of chart  Vision/Perception  Vision- History Baseline Vision/History: Wears glasses (No glasses present) Wears Glasses: Reading only Patient Visual Report: No change from baseline Vision- Assessment Vision Assessment?: Yes Eye Alignment: Within Functional Limits Ocular Range of Motion: Within Functional Limits Alignment/Gaze Preference: Within Defined Limits Visual Fields: No apparent deficits  Cognition Overall Cognitive Status: History of cognitive impairments - at baseline Arousal/Alertness: Awake/alert Orientation Level: Person;Place;Situation Person: Oriented Place: Oriented Situation: Oriented Year: 2017 Month: April Day of Week: Correct Memory: Impaired Memory Impairment: Decreased recall of new information Immediate Memory Recall: Sock;Blue;Bed Memory Recall: Sock;Blue;Bed Memory Recall Sock: With Cue Attention: Sustained Sustained Attention: Impaired Sustained Attention Impairment: Functional basic;Functional complex Selective Attention: Impaired Selective Attention Impairment: Functional basic;Functional  complex Awareness: Impaired Awareness Impairment: Intellectual impairment Problem Solving: Impaired Problem Solving Impairment: Functional basic;Functional complex Executive Function: Organizing;Self Monitoring;Self Correcting Organizing: Impaired Organizing Impairment: Functional basic Self Monitoring: Impaired Self Monitoring Impairment: Functional basic Self Correcting: Impaired Self Correcting Impairment: Functional basic Safety/Judgment: Appears intact Comments: Pt did not identify any issues when asked  about problems from this CVA.   He needed max instructional cueing for initiation of basic selfcare task of bathing with max instructional cueing to sequence through task for thoroughness.  Sensation Sensation Light Touch: Appears Intact Stereognosis: Not tested Hot/Cold: Not tested Proprioception: Not tested Coordination Gross Motor Movements are Fluid and Coordinated: Yes (In BUEs with selfcare performance) Fine Motor Movements are Fluid and Coordinated: Yes Motor  Motor Motor: Abnormal postural alignment and control Mobility  Bed Mobility Bed Mobility: Supine to Sit Supine to Sit: 4: Min assist;HOB flat Supine to Sit Details: Manual facilitation for placement  Trunk/Postural Assessment  Cervical Assessment Cervical Assessment:  (forward cervical flexion with slight head turn to the right) Thoracic Assessment Thoracic Assessment: Exceptions to [facility] (thoracic rounding) Lumbar Assessment Lumbar Assessment: Exceptions to [facility] (maintains posterior pelvic tilt)  Balance Balance Balance Assessed: Yes Static Sitting Balance Static Sitting - Balance Support: No upper extremity supported Static Sitting - Level of Assistance: 7: Independent Dynamic Sitting Balance Dynamic Sitting - Balance Support: Feet supported Dynamic Sitting - Level of Assistance: 5: Stand by assistance Static Standing Balance Static Standing - Balance Support: Right upper extremity supported;Left upper  extremity supported Static Standing - Level of Assistance: 5: Stand by assistance Dynamic Standing Balance Dynamic Standing - Level of Assistance: 4: Min assist Extremity/Trunk Assessment RUE Assessment RUE Assessment: Within Functional Limits (AROM WFLs for all joints with strength at least 4/5 throughout.  slight difficulty following directions for MMT however.) LUE Assessment LUE Assessment: Within Functional Limits (AROM WFLs for all joints with strength at least 4/5 throughout.  slight difficulty following directions for MMT however.)   See Function Navigator for Current Functional Status.   Refer to Care Plan for Long Term Goals  Recommendations for other services: None  Discharge Criteria: Patient will be discharged from OT if patient refuses treatment 3 consecutive times without medical reason, if treatment goals not met, if there is a change in medical status, if patient makes no progress towards goals or if patient is discharged from hospital.  The above assessment, treatment plan, treatment alternatives and goals were discussed and mutually agreed upon: by patient  Demari Kropp OTR/L 11/16/2015, 12:59 PM

## 2015-11-16 NOTE — Evaluation (Addendum)
Physical Therapy Assessment and Plan  Patient Details  Name: Dominic Pineda MRN: [id number] Date of Birth: [birth year]  PT Diagnosis: Abnormal posture, Abnormality of gait, Contracture of joint: bil ankles, bil hips and Hemiparesis dominant Rehab Potential: Good ELOS: 10-12   Today's Date: 11/16/2015 PT Individual Time: 1300 - 1430; 60 min      Problem List:  Patient Active Problem List   Diagnosis Date Noted  . CVA (cerebral vascular accident) (HCC) 11/15/2015  . Essential hypertension   . Hemiparesis affecting left side as late effect of stroke (HCC)   . Bipolar affective disorder in remission (HCC)   . Benign essential HTN   . History of CVA (cerebrovascular accident)   . Acute blood loss anemia   . AKI (acute kidney injury) (HCC)   . Acute cerebrovascular accident (CVA) (HCC) 11/12/2015  . Substance abuse   . Severe hypertension 11/11/2015  . Stroke (HCC)   . Urinary tract infectious disease   . Facial droop 10/30/2011  . CKD (chronic kidney disease) 10/30/2011  . UTI (lower urinary tract infection) 10/30/2011  . Obstructive uropathy 09/28/2011  . ARF (acute renal failure) (HCC) 09/23/2011  . Diarrhea 09/23/2011  . Nausea & vomiting 09/23/2011  . Hyperkalemia 09/23/2011  . Polyuria 09/23/2011  . Hypertensive emergency 09/23/2011  . Bipolar 1 disorder (HCC) 09/23/2011  . Shuffling gait 09/23/2011  . Weakness generalized 09/23/2011  . H/O cocaine abuse 09/23/2011  . Hypertension   . Incontinence     Past Medical History:  Past Medical History  Diagnosis Date  . Hypertension   . Bipolar 1 disorder (HCC)   . Incontinence   . Mental disorder   . Depression   . Anxiety   . Blood transfusion 1970's  . Shortness of breath     has had a recent increase in episodes of shortness of breath and hyperventilating  . Stroke [facility]) 2009-2010    "have had 3 strokes"; denies residual  . BPH (benign prostatic hyperplasia)   . H/O acute renal failure 08/2011    severe  w/hematuria  . Cocaine abuse    Past Surgical History:  Past Surgical History  Procedure Laterality Date  . Incision and drainage of wound      right knee "got piece of wire in it while mowing"  . Inguinal hernia repair      left  . Tonsillectomy      "as a child"    Assessment & Plan Clinical Impression: Dominic Pineda is a 65 y.o. right handed male with history of bipolar disorder, hypertension, CVA 2009 maintained on aspirin as well as prior cocaine use. Per chart review patient lives with sister and one level home with 2 steps to entry. He used a cane prior to admission. Presented 11/11/2015 with new onset of slurred speech and right-sided weakness.Urine drug screen negative. MRI of the brain showed acute subcentimeter left corona radiata infarct as well as old right MCA territory infarct. Old small cerebellar infarcts. Small area of left occipital lobe encephalomalacia. Echocardiogram with ejection fraction 65% grade 1 diastolic dysfunction. 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 Pt also diagnosed with UTI.  Patient transferred to CIR on 11/15/2015 .   Patient currently requires min with mobility secondary to muscle weakness  and muscle joint tightness, decreased cardiorespiratoy endurance and impaired timing and sequencing, unbalanced muscle activation and decreased coordination.  Prior to hospitalization, patient was modified independent  with mobility and lived with Family in a House home.  Home access is 2Stairs to enter, no rails. Patient will benefit from skilled PT intervention to maximize safe functional mobility, minimize fall risk and decrease caregiver burden for planned discharge home with 24 hour supervision.  Anticipate patient will  benefit from follow up HH at discharge.  PT - End of Session Activity Tolerance: Tolerates < 10 min activity with changes in vital signs Endurance Deficit: Yes Endurance Deficit Description: DOE after gait x 30' PT Assessment Rehab Potential (ACUTE/IP ONLY): Good PT Patient demonstrates impairments in the following area(s): Balance;Endurance;Motor;Safety PT Transfers Functional Problem(s): Bed Mobility;Bed to Chair;Car;Furniture PT Locomotion Functional Problem(s): Ambulation;Wheelchair Mobility;Stairs PT Plan PT Intensity: Minimum of 1-2 x/day ,45 to 90 minutes PT Frequency: 5 out of 7 days PT Duration Estimated Length of Stay: 10-12 PT Treatment/Interventions: Ambulation/gait training;Balance/vestibular training;Cognitive remediation/compensation;Discharge planning;Community reintegration;DME/adaptive equipment instruction;Functional electrical stimulation;Functional mobility training;Patient/family education;Neuromuscular re-education;Psychosocial support;Splinting/orthotics;Pain management;Stair training;Therapeutic Activities;Therapeutic Exercise;UE/LE Coordination activities;UE/LE Strength taining/ROM;Wheelchair propulsion/positioning PT Transfers Anticipated Outcome(s): supervision PT Locomotion Anticipated Outcome(s): supervision w/c x 150' (for L attention); supervision gait x 150' controlled env and community, x 50' home env; up/down 12 steps PT Recommendation Follow Up Recommendations: Home health PT Patient destination: Home Equipment Recommended: To be determined Equipment Details: owns a [facility] which he did not use  Skilled Therapeutic Intervention  Eval completed.  ELOS and LTGs discussed with pt.  neuromuscular re-education via demo, VCs, manual cues , forced use for : bil hip adduction in sitting; bil calf raises/toe raises in sitting; R/L ankle pumps in extended knee position iwht LE resting on stool; reducing posterior pelvic tilt in sitting during bil UE activity on table  in front of pt.  In sitting, block placed between feet to decrease ER bil hips and widening stance. NuStep at level 4 x 5 minutes with many rest breaks initiated by pt, for alternating reciprocal movements and core activation.  Pt switched to 22" wide w/c with more supportive cushion.  W/c propulsion improved in this set-up, with less veering L.  Pt left resting in w/c in room, with quick release belt donned and all needs within reach.   PT Evaluation Precautions/Restrictions Precautions Precautions: Fall Restrictions Weight Bearing Restrictions: No General   Vital SignsTherapy Vitals Temp: 97.9 F (36.6 C) Temp Source: Oral Pulse Rate: 71 Resp: 18 BP: (!) 152/92 mmHg Patient Position (if appropriate): Sitting Oxygen Therapy SpO2: 97 % O2 Device: Not Delivered Pain Pain Assessment Pain Assessment: No/denies pain Home Living/Prior Functioning Home Living Available Help at Discharge: Family;Available 24 hours/day Type of Home: House Home Access: Stairs to enter [employer] of Steps: 2 Home Layout: One level Bathroom Shower/Tub: Engineer, manufacturing systems: Standard Bathroom Accessibility: Yes  Lives With: Family Prior Function Level of Independence: Independent with basic ADLs;Independent with gait  Able to Take Stairs?: Yes Vocation: Retired Comments: Per sister, patient did not use a device such as a cane or walker. Vision/Perception  Vision - Assessment Eye Alignment: Within Functional Limits Ocular Range of Motion: Within Functional Limits Alignment/Gaze Preference: Within Defined Limits  Cognition Overall Cognitive Status: History of cognitive impairments - at baseline Arousal/Alertness: Awake/alert Attention: Sustained Sustained Attention: Impaired Sustained Attention Impairment: Functional basic;Functional complex Selective Attention: Impaired Selective Attention Impairment: Functional basic;Functional complex Memory: Impaired Memory  Impairment: Decreased recall of new information Awareness: Impaired Awareness  Impairment: Intellectual impairment Problem Solving: Impaired Problem Solving Impairment: Functional basic;Functional complex Comments: pt stated he came to the hospital because he had a bladder infection.  Max cueing to identify any impairments due to CVA.  Safety- pt attempts to pull up on furniture to stand, and does not remember that this is a safety risk throughout session, despite max VCs. Sensation Sensation Light Touch: Appears Intact Stereognosis: Not tested Hot/Cold: Not tested Proprioception: Appears Intact Coordination Gross Motor Movements are Fluid and Coordinated: No Fine Motor Movements are Fluid and Coordinated: No Heel Shin Test: difficult due to bil hip ER contractures; limited excursion, speed and coordination Motor  Motor Motor: Abnormal postural alignment and control Motor - Skilled Clinical Observations: increased waist crease R   Mobility Bed Mobility Bed Mobility: Not assessed Supine to Sit: 4: Min assist;HOB flat Supine to Sit Details: Manual facilitation for placement Transfers Transfers: Yes Stand Pivot Transfers: 4: Min assist Stand Pivot Transfer Details: Verbal cues for sequencing;Verbal cues for precautions/safety;Verbal cues for technique;Manual facilitation for placement Locomotion  Ambulation Ambulation: Yes Ambulation/Gait Assistance: 4: Min assist Ambulation Distance (Feet): 30 Feet Assistive device: None Ambulation/Gait Assistance Details: Verbal cues for technique;Verbal cues for precautions/safety Gait Gait: Yes Gait Pattern: Impaired Gait Pattern: Wide base of support;Decreased trunk rotation;Decreased hip/knee flexion - right;Decreased hip/knee flexion - left;Step-through pattern; R hip ER Stairs / Additional Locomotion Stairs: Yes Stairs Assistance: 4: Min assist Stairs Assistance Details: Verbal cues for gait pattern;Verbal cues for technique;Verbal  cues for precautions/safety Stair Management Technique: Two rails Number of Stairs: 12 (stairs in Ortho gym x 3) Height of Stairs: 6.5 Ramp: 4: Min assist Curb: 4: Min Paediatric nurse: Yes Wheelchair Assistance: 4: Administrator, sports Details: Musician for Water quality scientist: Both upper extremities Wheelchair Parts Management: Needs assistance Distance: 50  Trunk/Postural Assessment  Cervical Assessment Cervical Assessment: Exceptions to [facility] (limited PROM in all directions) Thoracic Assessment Thoracic Assessment: Within Functional Limits Lumbar Assessment Lumbar Assessment: Exceptions to [facility] (maintains posterior pelvic tilt, and increased wt bearing R hip) Postural Control Postural Control: Within Functional Limits  Balance Balance Balance Assessed: Yes Static Sitting Balance Static Sitting - Balance Support: No upper extremity supported Static Sitting - Level of Assistance: 7: Independent Dynamic Sitting Balance Dynamic Sitting - Balance Support: Feet supported Dynamic Sitting - Level of Assistance: 5: Stand by assistance Sitting balance - Comments: reaches out of BOS to R, reluctant to do so to L Static Standing Balance Static Standing - Balance Support: No upper extremity supported Static Standing - Level of Assistance: 5: Stand by assistance Dynamic Standing Balance Dynamic Standing - Balance Support: No upper extremity supported Dynamic Standing - Level of Assistance: 4: Min assist Extremity Assessment  RUE Assessment RUE Assessment: Within Functional Limits (AROM WFLs for all joints with strength at least 4/5 throughout.  slight difficulty following directions for MMT however.) LUE Assessment LUE Assessment: Within Functional Limits (AROM WFLs for all joints with strength at least 4/5 throughout.  slight difficulty following directions for MMT however.) RLE Assessment RLE Assessment: Within  Functional Limits (strength difficult to assess/WFLS; ankle DF 0 degrees, hamstrings and hip ER tight) LLE Assessment LLE Assessment: Within Functional Limits (strength difficult to assess/WFL; ankle DF 0 degrees; hamstrings  and hip ER tight)   See Function Navigator for Current Functional Status.   Refer to Care Plan for Long Term Goals  Recommendations for other services: None  Discharge Criteria: Patient will be discharged from PT if patient refuses treatment  3 consecutive times without medical reason, if treatment goals not met, if there is a change in medical status, if patient makes no progress towards goals or if patient is discharged from hospital.  The above assessment, treatment plan, treatment alternatives and goals were discussed and mutually agreed upon: by patient  Kohlton Gilpatrick 11/16/2015, 3:00 PM

## 2015-11-16 NOTE — Progress Notes (Signed)
Ankit Karis Juba, MD Physician Signed Physical Medicine and Rehabilitation Consult Note 11/14/2015 10:29 AM  Related encounter: ED to Hosp-Admission (Discharged) from 11/11/2015 in [facility] 5 CENTRAL NEURO SURGICAL    Expand All Collapse All        Physical Medicine and Rehabilitation Consult Reason for Consult: Left corona radiata infarct Referring Physician: Internal medicine   HPI: Dominic Pineda is a 65 y.o. right handed male with history of bipolar disorder, hypertension, CVA 2009 maintained on aspirin as well as prior cocaine use. Per chart review patient lives with sister and one level home with 2 steps to entry. He used a cane prior to admission. Presented 11/11/2015 with new onset of slurred speech and right-sided weakness. MRI of the brain showed acute subcentimeter left corona radiata infarct as well as old left right MCA territory infarct. Old small cerebellar infarcts. Small area of left occipital lobe encephalomalacia. Echocardiogram with ejection fraction 65% grade 1 diastolic dysfunction. 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 Patient did not receive TPA. Neurology follow-up currently maintained on aspirin therapy as well as the addition of Plavix. Subcutaneous Lovenox for DVT prophylaxis. Physical therapy evaluation completed with recommendations of physical medicine rehabilitation consult.   Review of Systems  Constitutional: Negative for fever and chills.  Gastrointestinal: Positive for constipation.  Neurological: Negative for weakness.  All other systems reviewed and are negative.  Past Medical History  Diagnosis Date  . Hypertension   . Bipolar 1 disorder (HCC)   . Incontinence   . Mental  disorder   . Depression   . Anxiety   . Blood transfusion 1970's  . Shortness of breath     has had a recent increase in episodes of shortness of breath and hyperventilating  . Stroke [facility]) 2009-2010    "have had 3 strokes"; denies residual  . BPH (benign prostatic hyperplasia)   . H/O acute renal failure 08/2011    severe w/hematuria  . Cocaine abuse    Past Surgical History  Procedure Laterality Date  . Incision and drainage of wound      right knee "got piece of wire in it while mowing"  . Inguinal hernia repair      left  . Tonsillectomy      "as a child"   Family History  Problem Relation Age of Onset  . Coronary artery disease Father    Social History:  reports that he quit smoking about 29 years ago. His smoking use included Cigarettes. He has a 18 pack-year smoking history. He has never used smokeless tobacco. He reports that he drinks alcohol. He reports that he uses illicit drugs ("Crack" cocaine and Marijuana). Allergies:  Allergies  Allergen Reactions  . Codeine Nausea Only   Medications Prior to Admission  Medication Sig Dispense Refill  . aspirin EC 81 MG tablet Take 81 mg by mouth daily.    . Cholecalciferol (VITAMIN D) 2000 units CAPS Take 2,000 Units by mouth daily.    Marland Kitchen lisinopril (PRINIVIL,ZESTRIL) 10 MG tablet Take 10 mg by mouth daily.    . simvastatin (ZOCOR) 10 MG tablet Take 10 mg by mouth daily at 6 PM.    . verapamil (CALAN-SR) 240 MG CR tablet Take 1 tablet (240 mg total) by  mouth at bedtime. (Patient taking differently: Take 240 mg by mouth every morning. ) 30 tablet 0    Home: Home Living Family/patient expects to be discharged to:: Private residence Living Arrangements: Other relatives (sister) Available Help at Discharge: Family, Available 24 hours/day Type of Home: House Home Access: Stairs to enter [employer] of Steps:  2 Home Layout: One level Bathroom Shower/Tub: Engineer, manufacturing systems: Standard Bathroom Accessibility: Yes Home Equipment: Cane - single point, Banker History: Prior Function Level of Independence: Needs assistance Gait / Transfers Assistance Needed: Pt reports he did not use a cane for mobility at home PTA. ADL's / Homemaking Assistance Needed: Sister assisting with getting pt into tub PTA but pt was bathing and dressing. Sister does cooking/cleaning. Comments: Per RN, pt states he was walking independently at home with a SPC.  Functional Status:  Mobility: Bed Mobility Overal bed mobility: Needs Assistance Bed Mobility: Supine to Sit Supine to sit: Min assist, HOB elevated Sit to supine: Min assist General bed mobility comments: Min hand held assist to pull trunk into sitting position. VCs throughout for use of hand rails, technique, sequencing, and initiation. Transfers Overall transfer level: Needs assistance Equipment used: None Transfers: Sit to/from Stand Sit to Stand: Min guard General transfer comment: Min guard for safety with sit to stand, Min assist provided for balance in standing.       ADL: ADL Overall ADL's : Needs assistance/impaired Eating/Feeding: Set up, Sitting Grooming: Minimal assistance, Standing, Wash/dry hands Grooming Details (indicate cue type and reason): Assist for balance in standing Upper Body Bathing: Min guard, Sitting Lower Body Bathing: Moderate assistance, Sit to/from stand Upper Body Dressing : Minimal assistance, Sitting, Cueing for sequencing Upper Body Dressing Details (indicate cue type and reason): to don hospital gown Lower Body Dressing: Moderate assistance, Sit to/from stand Toilet Transfer: Minimal assistance, Ambulation, Regular Toilet Toilet Transfer Details (indicate cue type and reason): Simulated by transfer from EOB to chair. Pt incontinent PTA but sister reports he was able to clean himself  independently. Toileting- Clothing Manipulation and Hygiene: Moderate assistance, Sit to/from stand Toileting - Clothing Manipulation Details (indicate cue type and reason): for toilet hygiene Functional mobility during ADLs: Minimal assistance General ADL Comments: Pt declining to use AD. Pt able to perform in-room mobility without AD; wide base of support and increased time required.  Cognition: Cognition Overall Cognitive Status: History of cognitive impairments - at baseline Orientation Level: Oriented X4 Cognition Arousal/Alertness: Awake/alert Behavior During Therapy: WFL for tasks assessed/performed Overall Cognitive Status: History of cognitive impairments - at baseline Difficult to assess due to: (Pt avoiding some orientation questions.)  Blood pressure 204/97, pulse 83, temperature 97.3 F (36.3 C), temperature source Oral, resp. rate 18, height 5\' 10"  (1.778 m), weight 127.189 kg (280 lb 6.4 oz), SpO2 100 %. Physical Exam  Vitals reviewed. Constitutional: He is oriented to person, place, and time. He appears well-developed.  Obese  HENT:  Head: Normocephalic and atraumatic.  Eyes: Conjunctivae and EOM are normal.  Neck: Normal range of motion. Neck supple. No thyromegaly present.  Cardiovascular: Normal rate and regular rhythm.  Respiratory: Effort normal and breath sounds normal. No respiratory distress.  GI: Soft. Bowel sounds are normal. He exhibits no distension.  Musculoskeletal: He exhibits no edema or tenderness.  Neurological: He is alert and oriented to person, place, and time.  He does make good eye contact with examiner.  Speech appears to be slightly dysarthric.  Limited awareness of deficits ? Mild left  facial weakness Sensation intact to light touch DTRs symmetric Motor: R UE/RLE: 5/5 proximal to distal LUE/LLE: 4+/5 proximal to distal  Skin: Skin is warm and dry.  Psychiatric: He has a normal mood and affect. His behavior is normal.     Lab  Results Last 24 Hours    Results for orders placed or performed during the hospital encounter of 11/11/15 (from the past 24 hour(s))  Basic metabolic panel Status: Abnormal   Collection Time: 11/14/15 7:07 AM  Result Value Ref Range   Sodium 139 135 - 145 mmol/L   Potassium 3.7 3.5 - 5.1 mmol/L   Chloride 102 101 - 111 mmol/L   CO2 29 22 - 32 mmol/L   Glucose, Bld 98 65 - 99 mg/dL   BUN 13 6 - 20 mg/dL   Creatinine, Ser 6.571.24 0.61 - 1.24 mg/dL   Calcium 8.8 (L) 8.9 - 10.3 mg/dL   GFR calc non Af Amer 59 (L) >60 mL/min   GFR calc Af Amer >60 >60 mL/min   Anion gap 8 5 - 15      Imaging Results (Last 48 hours)    No results found.    Assessment/Plan: Diagnosis: Left corona radiata infarct Labs and images independently reviewed. Records reviewed and summated above. Stroke: Continue secondary stroke prophylaxis and Risk Factor Modification listed below:  Antiplatelet therapy Blood Pressure Management: Continue current medication with prn's with permisive HTN per primary team Statin Agent Left sided hemiparesis: fit for orthotics to prevent contractures   1. Does the need for close, 24 hr/day medical supervision in concert with the patient's rehab needs make it unreasonable for this patient to be served in a less intensive setting? Potentially  2. Co-Morbidities requiring supervision/potential complications: bipolar disorder (ensure mood does not limit functional progress), HTN (monitor and provide prns in accordance with increased physical exertion and pain), history of CVA 2009, leukocytosis (cont to monitor for signs and symptoms of infection, further workup if indicated), AKI (avoid nephrotoxic meds), ABLA (transfuse if necessary to ensure appropriate perfusion for increased activity tolerance) 3. Due to safety, medication administration and patient education, does the patient require 24 hr/day rehab nursing? Yes 4. Does  the patient require coordinated care of a physician, rehab nurse, PT (1-2 hrs/day, 5 days/week), OT (1-2 hrs/day, 5 days/week) and SLP (1-2 hrs/day, 5 days/week) to address physical and functional deficits in the context of the above medical diagnosis(es)? Potentially Addressing deficits in the following areas: balance, endurance, locomotion, strength, transferring, toileting, cognition, speech, language and psychosocial support 5. Can the patient actively participate in an intensive therapy program of at least 3 hrs of therapy per day at least 5 days per week? Yes 6. The potential for patient to make measurable gains while on inpatient rehab is good 7. Anticipated functional outcomes upon discharge from inpatient rehab are modified independent and supervision with PT, modified independent and supervision with OT, supervision and min assist with SLP. 8. Estimated rehab length of stay to reach the above functional goals is: 7-10 days. 9. Does the patient have adequate social supports and living environment to accommodate these discharge functional goals? Yes 10. Anticipated D/C setting: Home 11. Anticipated post D/C treatments: HH therapy and Home excercise program 12. Overall Rehab/Functional Prognosis: good  RECOMMENDATIONS: This patient's condition is appropriate for continued rehabilitative care in the following setting: Will await more thorough PT evaluation, however, likely CIR Patient has agreed to participate in recommended program. Yes Note that insurance prior authorization may be required for  reimbursement for recommended care.  Comment: Rehab Admissions Coordinator to follow up.  Maryla Morrow, MD 11/14/2015       Revision History     Date/Time User Provider Type Action   11/14/2015 1:52 PM Ankit Karis Juba, MD Physician Sign   11/14/2015 10:44 AM Charlton Amor, PA-C Physician Assistant Pend   View Details Report       Routing History     Date/Time From To Method    11/14/2015 1:52 PM Ankit Karis Juba, MD Provider Not In System In Basket

## 2015-11-16 NOTE — Evaluation (Addendum)
Speech Language Pathology Assessment and Plan  Patient Details  Name: Dominic Pineda MRN: [id number] Date of Birth: Dec 20, 1950  SLP Diagnosis: Cognitive Impairments  Rehab Potential: Good ELOS: 5-7 days for ST     Today's Date: 11/16/2015 SLP Individual Time: 0800-0900 SLP Individual Time Calculation (min): 60 min   Problem List:  Patient Active Problem List   Diagnosis Date Noted  . CVA (cerebral vascular accident) ([place]) 11/15/2015  . Essential hypertension   . Hemiparesis affecting left side as late effect of stroke ([place])   . Bipolar affective disorder in remission ([place])   . Benign essential HTN   . History of CVA (cerebrovascular accident)   . Acute blood loss anemia   . AKI (acute kidney injury) ([place])   . Acute cerebrovascular accident (CVA) ([place]) 11/12/2015  . Substance abuse   . Severe hypertension 11/11/2015  . Stroke ([place])   . Urinary tract infectious disease   . Facial droop 10/30/2011  . CKD (chronic kidney disease) 10/30/2011  . UTI (lower urinary tract infection) 10/30/2011  . Obstructive uropathy 09/28/2011  . ARF (acute renal failure) ([place]) 09/23/2011  . Diarrhea 09/23/2011  . Nausea & vomiting 09/23/2011  . Hyperkalemia 09/23/2011  . Polyuria 09/23/2011  . Hypertensive emergency 09/23/2011  . Bipolar 1 disorder ([place]) 09/23/2011  . Shuffling gait 09/23/2011  . Weakness generalized 09/23/2011  . H/O cocaine abuse 09/23/2011  . Hypertension   . Incontinence    Past Medical History:  Past Medical History  Diagnosis Date  . Hypertension   . Bipolar 1 disorder ([place])   . Incontinence   . Mental disorder   . Depression   . Anxiety   . Blood transfusion 1970's  . Shortness of breath     has had a recent increase in episodes of shortness of breath and hyperventilating  . Stroke [facility]) 2009-2010    "have had 3 strokes"; denies residual  . BPH (benign prostatic hyperplasia)   . H/O acute renal failure 08/2011    severe w/hematuria  . Cocaine abuse     Past Surgical History:  Past Surgical History  Procedure Laterality Date  . Incision and drainage of wound      right knee "got piece of wire in it while mowing"  . Inguinal hernia repair      left  . Tonsillectomy      "as a child"    Assessment / Plan / Recommendation Clinical Impression  65 y.o. right handed male with history of bipolar disorder, hypertension, CVA 2009 maintained on aspirin as well as prior cocaine use. Presented 11/11/2015 with new onset of slurred speech and right-sided weakness.Urine drug screen negative. MRI of the brain showed acute subcentimeter left corona radiata infarct as well as old right MCA territory infarct. Old small cerebellar infarcts. Small area of left occipital lobe encephalomalacia. Pt admitted to CIR on 11/15/2015.  SLP evaluation completed on 11/16/2015 with the following results: Pt presents with moderate cognitive deficits characterized by decreased emergent awareness of deficits, decreased functional problem solving, decreased retrieval of information, and decreased selective attention to tasks.  Pt is likely near his baseline given history of multiple previous strokes; however, pt reported that he managed his own finances prior to admission.  The abovementioned deficits would impact his ability to return to completing these tasks successfully.  As a result, recommend brief ST follow up while inpatient in order to maximize functional independence and reduce burden of care prior to discharge.  Would recommend at least  initial assist for medication and financial management at discharge but do not anticipate further ST needs at next level of care.     Skilled Therapeutic Interventions          Cognitive-linguistic evaluation completed with results and recommendations reviewed with patient.   5 word delayed recall improved from 0 to 5 accuracy to 5 out of 5 with max assist verbal cues.  Pt required max assist verbal cues for working memory and thought  organization to complete serial subtraction for 60% accuracy.  Pt with poor awareness of errors and decreased attention to detail on clock drawing despite max assist question cues.  Pt educated regarding goals for ST to address while inpatient.  Pt in agreement with plan of care.  Pt left in bed with call bell within reach.      SLP Assessment  Patient will need skilled [place] [facility] during CIR admission    Recommendations  Patient destination: Home Follow up Recommendations: None Equipment Recommended: None recommended by SLP    SLP Frequency 1 to 3 out of 7 days   SLP Duration  SLP Intensity  SLP Treatment/Interventions 5-7 days for ST   Minumum of 1-2 x/day, 30 to 90 minutes  Cognitive remediation/compensation;Cueing hierarchy;Functional tasks;Internal/external aids;Patient/family education;Environmental controls    Pain Pain Assessment Pain Assessment: 0-10 Pain Score: 3  Pain Location: Head Pain Descriptors / Indicators: Headache Pain Intervention(s): Other (Comment);RN made aware (declined pain medication )  Prior Functioning Cognitive/Linguistic Baseline: Baseline deficits Type of Home: House  Lives With: Family Available Help at Discharge: Family;Available 24 hours/day Education: 1 year of college  Vocation: Retired  Function:  Eating Eating   Modified Consistency Diet: No Eating Assist Level: No help, No cues           Cognition Comprehension Comprehension assist level: Follows basic conversation/direction with no assist  Expression   Expression assist level: Expresses basic needs/ideas: With extra time/assistive device  Social Interaction Social Interaction assist level: Interacts appropriately 75 - 89% of the time - Needs redirection for appropriate language or to initiate interaction.  Problem Solving Problem solving assist level: Solves basic 50 - 74% of the time/requires cueing 25 - 49% of the time  Memory Memory assist level:  Recognizes or recalls 50 - 74% of the time/requires cueing 25 - 49% of the time   Short Term Goals: Week 1: SLP Short Term Goal 1 (Week 1): STG=LTG due to ELOS for SLP   Refer to Care Plan for Long Term Goals  Recommendations for other services: None  Discharge Criteria: Patient will be discharged from SLP if patient refuses treatment 3 consecutive times without medical reason, if treatment goals not met, if there is a change in medical status, if patient makes no progress towards goals or if patient is discharged from hospital.  The above assessment, treatment plan, treatment alternatives and goals were discussed and mutually agreed upon: by patient  Emilio Math 11/16/2015, 10:56 AM

## 2015-11-16 NOTE — Progress Notes (Signed)
Retta Diones, RN Rehab Admission Coordinator Signed Physical Medicine and Rehabilitation PMR Pre-admission 11/15/2015 1:54 PM  Related encounter: ED to Hosp-Admission (Discharged) from 11/11/2015 in [place] All Collapse All   PMR Admission Coordinator Pre-Admission Assessment  Patient: Dominic Pineda is an 65 y.o., male MRN: [id number] DOB: April 29, 1951 Height: '5\' 10"'$  (177.8 cm) Weight: 127.189 kg (280 lb 6.4 oz)  Insurance Information HMO: Yes PPO: PCP: IPA: 80/20: OTHER:  PRIMARY: Laurence Spates [facility] Policy#: [id number] Subscriber: Dominic Pineda CM Name: Dominic Pineda Phone#: [phone] Fax#: [phone] Pre-Cert#: [id number] from 16/07 to 11/21/15 Employer: Not employed Benefits: Phone #: [phone] Name: Dominic Pineda. Date: 04/29/14 Deduct: $0 Out of Pocket Max: $5900 (met $0) Life Max: unlimited CIR: $275 days 1-6 SNF: $0 days 1-20; $160 days 21-100 Outpatient: medical necessity Co-Pay: $40 Home Health: 100% Co-Pay: none DME: 80% Co-Pay: 20% Providers: in network  Emergency Contact Information Contact Information    Name Relation Home Work Mobile   Dominic Pineda Daughter   [phone]   Dominic Pineda   [phone]     Current Medical History  Patient Admitting Diagnosis: L CR infarct  History of Present Illness: A 65 y.o. right handed male with history of bipolar disorder, hypertension, CVA 2009 maintained on aspirin as well as prior cocaine use. Per chart review patient lives with sister and one level home with 2 steps to entry. He used a cane prior to admission. Presented 11/11/2015 with new onset of slurred speech and right-sided weakness. MRI of the brain  showed acute subcentimeter left corona radiata infarct as well as old left right MCA territory infarct. Old small cerebellar infarcts. Small area of left occipital lobe encephalomalacia. Echocardiogram with ejection fraction 93% grade 1 diastolic dysfunction. 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 Patient did not receive TPA. Neurology follow-up currently maintained on aspirin therapy as well as the addition of Plavix. Subcutaneous Lovenox for DVT prophylaxis. Physical therapy evaluation completed with recommendations of physical medicine rehabilitation consult.   Total: 0=NIH  Past Medical History  Past Medical History  Diagnosis Date  . Hypertension   . Bipolar 1 disorder ([place])   . Incontinence   . Mental disorder   . Depression   . Anxiety   . Blood transfusion 1970's  . Shortness of breath     has had a recent increase in episodes of shortness of breath and hyperventilating  . Stroke [facility]) 2009-2010    "have had 3 strokes"; denies residual  . BPH (benign prostatic hyperplasia)   . H/O acute renal failure 08/2011    severe w/hematuria  . Cocaine abuse     Family History  family history includes Coronary artery disease in his father.  Prior Rehab/Hospitalizations: No previous rehab.  Has the patient had major surgery during 100 days prior to admission? No  Current Medications   Current facility-administered medications:  . acetaminophen (TYLENOL) tablet 650 mg, 650 mg, Oral, Q4H PRN **OR** acetaminophen (TYLENOL) suppository 650 mg, 650 mg, Rectal, Q4H PRN, Milagros Loll, MD . aspirin EC tablet 81 mg, 81 mg, Oral, Daily, Milagros Loll, MD, 81 mg at 11/15/15 1000 .  atorvastatin (LIPITOR) tablet 40 mg, 40 mg, Oral, q1800, Milagros Loll,  MD, 40 mg at 11/14/15 1825 . cholecalciferol (VITAMIN D) tablet 2,000 Units, 2,000 Units, Oral, Daily, Milagros Loll, MD, 2,000 Units at 11/15/15 1000 . ciprofloxacin (CIPRO) tablet 500 mg, 500 mg, Oral, BID, Iline Oven, MD, 500 mg at 11/15/15 2025 . clopidogrel (PLAVIX) tablet 75 mg, 75 mg, Oral, Daily, Juliet Rude, MD, 75 mg at 11/15/15 1000 . enoxaparin (LOVENOX) injection 40 mg, 40 mg, Subcutaneous, Q24H, Milagros Loll, MD, 40 mg at 11/14/15 2139 . hydrALAZINE (APRESOLINE) injection 5 mg, 5 mg, Intravenous, Q6H PRN, Milagros Loll, MD, 5 mg at 11/12/15 2220 . lisinopril (PRINIVIL,ZESTRIL) tablet 40 mg, 40 mg, Oral, Daily, Iline Oven, MD, 40 mg at 11/15/15 1000 . senna-docusate (Senokot-S) tablet 1 tablet, 1 tablet, Oral, QHS PRN, Milagros Loll, MD . verapamil (CALAN-SR) CR tablet 240 mg, 240 mg, Oral, QHS, Iline Oven, MD  Patients Current Diet: Diet Heart Room service appropriate?: Yes; Fluid consistency:: Thin Diet - low sodium heart healthy  Precautions / Restrictions Precautions Precautions: Fall Restrictions Weight Bearing Restrictions: No   Has the patient had 2 or more falls or a fall with injury in the past year?No  Prior Activity Level Limited Community (1-2x/wk): Went out 1 X a week. sister drives.  Home Assistive Devices / Equipment Home Assistive Devices/Equipment: None Home Equipment: Cane - single point, Shower seat  Prior Device Use: Indicate devices/aids used by the patient prior to current illness, exacerbation or injury? None  Prior Functional Level Prior Function Level of Independence: Needs assistance Gait / Transfers Assistance Needed: Pt reports he did not use a cane for mobility at home PTA. ADL's / Homemaking Assistance Needed: Sister assisting with getting pt into tub PTA but pt was bathing and dressing. Sister does  cooking/cleaning. Comments: Per sister, patient did not use a device such as a cane or walker.  Self Care: Did the patient need help bathing, dressing, using the toilet or eating? Independent  Indoor Mobility: Did the patient need assistance with walking from room to room (with or without device)? Independent  Stairs: Did the patient need assistance with internal or external stairs (with or without device)? Independent  Functional Cognition: Did the patient need help planning regular tasks such as shopping or remembering to take medications? Independent  Current Functional Level Cognition  Overall Cognitive Status: No family/caregiver present to determine baseline cognitive functioning Difficult to assess due to: (Pt avoiding some orientation questions.) Current Attention Level: Selective Orientation Level: Oriented X4 General Comments: Pt very fidgety and requires cues to redirect and focus.    Extremity Assessment (includes Sensation/Coordination)  Upper Extremity Assessment: Overall WFL for tasks assessed (slight decrease in gross motor coordination but [facility])  Lower Extremity Assessment: Defer to PT evaluation RLE Deficits / Details: BILATERALLY: pt with inconsistent MMT results. Appears to resist therapist initially with strength testing however "lets go" and does not complete test.     ADLs  Overall ADL's : Needs assistance/impaired Eating/Feeding: Set up, Sitting Grooming: Applying deodorant, Sitting, Set up, Supervision/safety Grooming Details (indicate cue type and reason): Assist for balance in standing Upper Body Bathing: Supervision/ safety, Set up, Sitting, Standing Lower Body Bathing: Minimal assistance (sitting and standing) Upper Body Dressing : Minimal assistance, Sitting, Standing Upper Body Dressing Details (indicate cue type and reason): to don hospital gown Lower Body Dressing: Supervision/safety, Sitting/lateral leans Lower Body Dressing Details  (indicate cue type and reason): doffed socks Toilet Transfer: Minimal assistance, Ambulation (sit to stand from bed and chair) Toilet  Transfer Details (indicate cue type and reason): Simulated by transfer from EOB to chair. Pt incontinent PTA but sister reports he was able to clean himself independently. Toileting- Clothing Manipulation and Hygiene: Moderate assistance, Sit to/from stand Toileting - Clothing Manipulation Details (indicate cue type and reason): for toilet hygiene Functional mobility during ADLs: (Min assist for ambulation; Min guard for sit to stand) General ADL Comments: Pt reported he was lightheaded in session. Talked about using long sponge for LB bathing.    Mobility  Overal bed mobility: Needs Assistance Bed Mobility: Supine to Sit, Sit to Supine Supine to sit: Supervision Sit to supine: Supervision General bed mobility comments: Pt uses bed rail to come to sitting and uses bed rails to scoot to [facility].    Transfers  Overall transfer level: Needs assistance Equipment used: None Transfers: Sit to/from Stand Sit to Stand: Min guard General transfer comment: Min instability noted w/ standing and on first sit>stand, pt requests to sit down because he "doesn't feel right".     Ambulation / Gait / Stairs / Wheelchair Mobility  Ambulation/Gait Ambulation/Gait assistance: Architect (Feet): 40 Feet Assistive device: None Gait Pattern/deviations: Step-through pattern, Decreased stride length, Staggering left, Staggering right General Gait Details: Unsteady gait requiring min assist to steady. Pt requesting to sit after ambulating just 20 ft which he reports is due to fatigue. Gait velocity interpretation: Below normal speed for age/gender    Posture / Balance Dynamic Sitting Balance Sitting balance - Comments: Initially requiring 1 UE support to maintain sitting balance however at end of EOB activity raised both hands up and was able to  maintain for a short time (<5 seconds). Balance Overall balance assessment: Needs assistance Sitting-balance support: No upper extremity supported, Feet supported Sitting balance-Leahy Scale: Good Sitting balance - Comments: Initially requiring 1 UE support to maintain sitting balance however at end of EOB activity raised both hands up and was able to maintain for a short time (<5 seconds). Standing balance support: No upper extremity supported, During functional activity Standing balance-Leahy Scale: Fair Standing balance comment: min instabilility noted w/ static standing.    Special needs/care consideration BiPAP/CPAP No CPM No Continuous Drip IV No Dialysis No  Life Vest No Oxygen No Special Bed No Trach Size No Wound Vac (area) No  Skin Rough skin  Bowel mgmt: Last BM 11/13/15 with incontinence at times Bladder mgmt: Condom catheter with incontinence at times Diabetic mgmt No    Previous Home Environment Living Arrangements: Other relatives (sister) Available Help at Discharge: Family, Available 24 hours/day Type of Home: House Home Layout: One level Home Access: Stairs to enter [employer] of Steps: 2 Bathroom Shower/Tub: Engineer, manufacturing systems: Standard Bathroom Accessibility: Yes How Accessible: Accessible via walker Home Care Services: No  Discharge Living Setting Plans for Discharge Living Setting: Patient's home, House, Lives with (comment) (Lives with sister, niece, nephew) Type of Home at Discharge: House Discharge Home Layout: One level Discharge Home Access: Ramped entrance Does the patient have any problems obtaining your medications?: No  Social/Family/Support Systems Patient Roles: Parent, Other (Comment) (Has a sister, daughter, niece, nephew) Contact Information: [place] Public - sister Anticipated Caregiver: Sister Anticipated Caregiver's Contact Information: Kennon Rounds - sister -  [phone] Ability/Limitations of Caregiver: Sister and extended family can assist Caregiver Availability: 24/7 Discharge Plan Discussed with Primary Caregiver: Yes Is Caregiver In Agreement with Plan?: Yes Does Caregiver/Family have Issues with Lodging/Transportation while Pt is in Rehab?: No  Goals/Additional Needs Patient/Family Goal for Rehab: PT/OT  mod I and supervision, ST supervision and min assist goals Expected length of stay: 7-10 days Cultural Considerations: [place] Needs: Heart diet, thin liquids Equipment Needs: TBD Pt/Family Agrees to Admission and willing to participate: Yes Program Orientation Provided & Reviewed with Pt/Caregiver Including Roles & Responsibilities: Yes  Decrease burden of Care through IP rehab admission: N/A  Possible need for SNF placement upon discharge: Not anticipated  Patient Condition: This patient's condition remains as documented in the consult dated 11/14/15, in which the Rehabilitation Physician determined and documented that the patient's condition is appropriate for intensive rehabilitative care in an inpatient rehabilitation facility. Will admit to inpatient rehab today.  Preadmission Screen Completed By: Retta Diones, 11/15/2015 2:09 PM ______________________________________________________________________  Discussed status with Dr. Naaman Plummer on 11/15/15 at 1409 and received telephone approval for admission today.  Admission Coordinator: Retta Diones, time1409/Date04/18/17          Cosigned by: Meredith Staggers, MD at 11/15/2015 2:55 PM  Revision History     Date/Time User Provider Type Action   11/15/2015 2:55 PM Meredith Staggers, MD Physician Cosign   11/15/2015 2:09 PM Retta Diones, RN Rehab Admission Coordinator Sign

## 2015-11-17 ENCOUNTER — Inpatient Hospital Stay (HOSPITAL_COMMUNITY): Payer: Commercial Managed Care - HMO | Admitting: Speech Pathology

## 2015-11-17 ENCOUNTER — Inpatient Hospital Stay (HOSPITAL_COMMUNITY): Payer: Commercial Managed Care - HMO | Admitting: Physical Therapy

## 2015-11-17 ENCOUNTER — Inpatient Hospital Stay (HOSPITAL_COMMUNITY): Payer: Commercial Managed Care - HMO | Admitting: Occupational Therapy

## 2015-11-17 ENCOUNTER — Inpatient Hospital Stay (HOSPITAL_COMMUNITY): Payer: No Typology Code available for payment source

## 2015-11-17 MED ORDER — VERAPAMIL HCL ER 180 MG PO TBCR
360.0000 mg | EXTENDED_RELEASE_TABLET | Freq: Every day | ORAL | Status: DC
  Administered 2015-11-17 – 2015-11-23 (×7): 360 mg via ORAL
  Filled 2015-11-17 (×8): qty 2

## 2015-11-17 MED ORDER — HYDROCERIN EX CREA
TOPICAL_CREAM | Freq: Two times a day (BID) | CUTANEOUS | Status: DC
  Administered 2015-11-18 – 2015-11-19 (×3): via TOPICAL
  Administered 2015-11-19: 1 via TOPICAL
  Administered 2015-11-20 – 2015-11-25 (×11): via TOPICAL
  Administered 2015-11-25 – 2015-11-26 (×2): 1 via TOPICAL
  Administered 2015-11-26 – 2015-11-28 (×4): via TOPICAL
  Filled 2015-11-17 (×2): qty 113

## 2015-11-17 NOTE — Progress Notes (Signed)
Inpatient Rehabilitation Center Individual Statement of Services  Patient Name:  Dominic Pineda  Date:  11/17/2015  Welcome to the Inpatient Rehabilitation Center.  Our goal is to provide you with an individualized program based on your diagnosis and situation, designed to meet your specific needs.  With this comprehensive rehabilitation program, you will be expected to participate in at least 3 hours of rehabilitation therapies Monday-Friday, with modified therapy programming on the weekends.  Your rehabilitation program will include the following services:  Physical Therapy (PT), Occupational Therapy (OT), Speech Therapy (ST), 24 hour per day rehabilitation nursing, Case Management (Social Worker), Rehabilitation Medicine, Nutrition Services and Pharmacy Services  Weekly team conferences will be held on Wednesdays to discuss your progress.  Your Social Worker will talk with you frequently to get your input and to update you on team discussions.  Team conferences with you and your family in attendance may also be held.  Expected length of stay: 10 to 12 days  Overall anticipated outcome: Supervision  Depending on your progress and recovery, your program may change. Your Social Worker will coordinate services and will keep you informed of any changes. Your Social Worker's name and contact numbers are listed  below.  The following services may also be recommended but are not provided by the Inpatient Rehabilitation Center:   Driving Evaluations  Home Health Rehabiltiation Services  Outpatient Rehabilitation Services   Arrangements will be made to provide these services after discharge if needed.  Arrangements include referral to agencies that provide these services.  Your insurance has been verified to be:  [employer] Your primary doctor is:  [facility]  Pertinent information will be shared with your doctor and your insurance company.  Social Worker:  Staci AcostaJenny Kilynn Fitzsimmons, LCSW  [phone]  [phone] or [phone]) [phone]  Information discussed with and copy given to patient by: Elvera LennoxPrevatt, Mariaeduarda Defranco Capps, 11/17/2015, 12:23 PM

## 2015-11-17 NOTE — Progress Notes (Signed)
Occupational Therapy Session Note  Patient Details  Name: Dominic HoughRonald E Biello MRN: [id number] Date of Birth: Jan 30, 1951  Today's Date: 11/17/2015 OT Individual Time: [id number] OT Individual Time Calculation (min): 60 min    Short Term Goals: Week 1:  OT Short Term Goal 1 (Week 1): Pt will complete LB selfcare with supervision sit to stand using AE PRN. OT Short Term Goal 2 (Week 1): Pt will complete toilet transfers with supervision using LRAD. OT Short Term Goal 3 (Week 1): Pt will complete shower transfers with supervision using LRAD. OT Short Term Goal 4 (Week 1): Pt will tolerate standing for at least 3 mins during grooming tasks at the sink.   Skilled Therapeutic Interventions/Progress Updates: Patient difficult to arouse upon approach this late afternoon for OT...Marland Kitchen.he did just finish Phys Therapy about 45 minutes earlier and prior to that speech therapy and another PT session.   He also stated he only slept 2-6am this morning.   He was educated on the importance of sleep for function, brain health and organ health and reminded to alert his nurse if he is having trouble sleeping.  He was able to return demonstration for alerting his nurse via his call bell.  Attempted sit to stand and standing balance and tolerance actitives with bilateral UE use - especially using left hand as lead, but patient finally stated that he was too fatigued to stand up.   So, he sat edge of bed for endurance dynamic balance and endurance activities.    He was able to lotion his right lower leg and top of foot, eleveated on a stool, but for the left he lacked balance and right hip flexion to reach his foot.   As well, he stated, "I am afraid I might fall over on that side to get to that foot."   For donning shoes he was required mod A requiring more help to get left foot into shoe.  He was able to complete oral care sitting edge of bed using bilateral upper extremities but required moderate assistance to atttend, focus  and complete the task.    To ly back down and complete bed mobility, he required min A and extra time as he stated his legs were tired.  Not sure this patient will be able to iniitate contacting nurse for sleep aide; so, she stated the nursing staff will monitor him a little closer tonight in case he will benefit from a sleep aide as reportedly, he was very sleepy today and had difficulty staying awake.     Therapy Documentation Precautions:  Precautions Precautions: Fall Restrictions Weight Bearing Restrictions: No  Pain: Pain Assessment Pain Assessment: No/denies pain  See Function Navigator for Current Functional Status.   Therapy/Group: Individual Therapy  Bud Faceickett, T[facility]una [facility] 11/17/2015, 4:07 PM

## 2015-11-17 NOTE — Progress Notes (Signed)
Speech Language Pathology Daily Session Note  Patient Details  Name: Dominic HoughRonald E Epperly MRN: [id number] Date of Birth: August 03, 1950  Today's Date: 11/17/2015 SLP Individual Time: 0904-1000 SLP Individual Time Calculation (min): 56 min  Short Term Goals: Week 1: SLP Short Term Goal 1 (Week 1): STG=LTG due to ELOS for SLP   Skilled Therapeutic Interventions:  Pt was seen for skilled ST targeting cognitive goals and family education.  Pt's sister was present during today's therapy session.   SLP initiated skilled education regarding pt's current goals and cognitive limitations s/p CVA.  Pt's sister reports generalized cognitive slowing since CVA but she verified pt reports during yesterday's evaluation regarding previous level of function (pt required assistance for most home management and self care tasks but did assist in financial management prior to admission).  SLP provided skilled education regarding compensatory memory strategies and provided pt's sister with a handout to maximize carryover in the home environment.  Pt required max assist verbal cues to count money due to working memory and attention impairments which pt's sister reports to be different from his baseline.  Pt was transferred back to bed and left with sister at bedside.  Continue per current plan of care.    Function:  Eating Eating                 Cognition Comprehension Comprehension assist level: Follows basic conversation/direction with no assist  Expression   Expression assist level: Expresses basic needs/ideas: With extra time/assistive device  Social Interaction Social Interaction assist level: Interacts appropriately 75 - 89% of the time - Needs redirection for appropriate language or to initiate interaction.  Problem Solving Problem solving assist level: Solves basic 25 - 49% of the time - needs direction more than half the time to initiate, plan or complete simple activities  Memory Memory assist level:  Recognizes or recalls 25 - 49% of the time/requires cueing 50 - 75% of the time    Pain Pain Assessment Pain Assessment: No/denies pain  Therapy/Group: Individual Therapy  Ivy Meriwether, Melanee SpryNicole L 11/17/2015, 1:12 PM

## 2015-11-17 NOTE — Progress Notes (Signed)
Physical Therapy Session Note  Patient Details  Name: Dominic Pineda MRN: [id number] Date of Birth: Apr 07, 1951  Today's Date: 11/17/2015 PT Individual Time: 1445-1510 PT Individual Time Calculation (min): 25 min   Short Term Goals: Week 1:  PT Short Term Goal 1 (Week 1): = LTGs due to ELOS  Skilled Therapeutic Interventions/Progress Updates:    Pt received asleep in bed, easily awoken and agreeable to treatment however increased time required to initiate getting OOB due to lethargy/fatigue. Denies pain, but does c/o stomach "feeling funny"; states not nauseous however could not describe otherwise and didn't recall if he had alerted his nurse. Still willing to participate initially, states he might be hungry. Supine>sit with S, HOB elevated and bedrails. Seated on EOB, shoes donned totalA for time management. Pt attempted to eat lunch, min cues for attention to task before pt ultimately reporting he's not hungry. Agreeable to ambulate in hallway, however no initiation of task and when repetitively encouraged pt declines stating he really does not feel well, doesn't feel up to doing anything. Educated pt on rehab process/goals and importance of participation, however pt continues to decline. Sit >supine with S. Remained supine in bed at completion of session, all needs within reach. RN alerted to pt c/o stomach discomfort.   Therapy Documentation Precautions:  Precautions Precautions: Fall Restrictions Weight Bearing Restrictions: No General: PT Amount of Missed Time (min): 20 Minutes PT Missed Treatment Reason: Patient ill (Comment);Patient unwilling to participate (declines d/t stomach discomfort) Pain: Pain Assessment Pain Assessment: No/denies pain   See Function Navigator for Current Functional Status.   Therapy/Group: Individual Therapy  Vista Lawmanlizabeth J Tygielski 11/17/2015, 3:22 PM

## 2015-11-17 NOTE — Progress Notes (Signed)
65 y.o. right handed male with history of bipolar disorder, hypertension, CVA 2009 maintained on aspirin as well as prior cocaine use. Per chart review patient lives with sister and one level home with 2 steps to entry. He used a cane prior to admission. Presented 11/11/2015 with new onset of slurred speech and right-sided weakness.Urine drug screen negative. MRI of the brain showed acute subcentimeter left corona radiata infarct as well as old right MCA territory infarct. Old small cerebellar infarcts. Small area of left occipital lobe encephalomalacia. Echocardiogram with ejection fraction 59% grade 1 diastolic dysfunction. 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 Carotid Dopplers with left 40-59% ICA stenosis  Subjective/Complaints: Nursing documents bowel and bladder incont , pt states he had this at home.  States he couldn 't control it.  Did not use walker at home ROS-No CP or SOB, feels week legs and arms, good appetite Objective: Vital Signs: Blood pressure 162/69, pulse 70, temperature 98 F (36.7 C), temperature source Oral, resp. rate 17, height '5\' 10"'$  (1.778 m), weight 127.85 kg (281 lb 13.7 oz), SpO2 96 %. No results found. Results for orders placed or performed during the hospital encounter of 11/15/15 (from the past 72 hour(s))  CBC WITH DIFFERENTIAL     Status: Abnormal   Collection Time: 11/16/15  4:48 AM  Result Value Ref Range   WBC 11.1 (H) 4.0 - 10.5 K/uL   RBC 4.63 4.22 - 5.81 MIL/uL   Hemoglobin 13.2 13.0 - 17.0 g/dL   HCT 39.8 39.0 - 52.0 %   MCV 86.0 78.0 - 100.0 fL   MCH 28.5 26.0 - 34.0 pg   MCHC 33.2 30.0 - 36.0 g/dL   RDW 14.4 11.5 - 15.5 %   Platelets 226 150 - 400 K/uL   Neutrophils Relative % 34 %   Lymphocytes Relative 59 %   Monocytes Relative 5 %   Eosinophils Relative 2 %   Basophils Relative 0 %   Neutro Abs 3.8 1.7 - 7.7 K/uL   Lymphs Abs 6.5 (H) 0.7 - 4.0 K/uL   Monocytes Absolute 0.6 0.1 - 1.0 K/uL   Eosinophils Absolute 0.2 0.0 - 0.7 K/uL   Basophils Absolute 0.0 0.0 - 0.1 K/uL   WBC Morphology ATYPICAL LYMPHOCYTES   Comprehensive metabolic panel     Status: Abnormal   Collection Time: 11/16/15  4:48 AM  Result Value Ref Range   Sodium 138 135 - 145 mmol/L   Potassium 4.1 3.5 - 5.1 mmol/L   Chloride 103 101 - 111 mmol/L   CO2 27 22 - 32 mmol/L   Glucose, Bld 164 (H) 65 - 99 mg/dL   BUN 12 6 - 20 mg/dL   Creatinine, Ser 1.31 (H) 0.61 - 1.24 mg/dL   Calcium 8.9 8.9 - 10.3 mg/dL   Total Protein 6.3 (L) 6.5 - 8.1 g/dL   Albumin 3.1 (L) 3.5 - 5.0 g/dL   AST 20 15 - 41 U/L   ALT 13 (L) 17 - 63 U/L   Alkaline Phosphatase 97 38 - 126 U/L   Total Bilirubin 1.1 0.3 - 1.2 mg/dL   GFR calc non Af Amer 55 (L) >60 mL/min   GFR calc Af Amer >60 >60 mL/min    Comment: (NOTE) The eGFR has been calculated using the CKD EPI equation.  This calculation has not been validated in all clinical situations. eGFR's persistently <60 mL/min signify possible Chronic Kidney Disease.    Anion gap 8 5 - 15     HEENT: normal Cardio: RRR Resp: CTA B/L and unlabored GI: BS positive and NT, ND Extremity:  No Edema Skin:   Intact Neuro: Lethargic, Normal Sensory, Abnormal Motor right Delt bi tri grip 4/5, R HF 4/5 R ADF 4/5 , left side 5/5, normal tone and Abnormal FMC Ataxic/ dec FMC Musc/Skel:  Other no pain with UE or LE ROM Gen NAD   Assessment/Plan: 1. 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 Physiatrist is providing close team supervision and 24 hour management of active medical problems listed below. Physiatrist and rehab team continue to assess barriers to discharge/monitor patient progress  toward functional and medical goals. FIM: Function - Bathing Position: Shower Body parts bathed by patient: Right arm, Left arm, Chest, Abdomen, Front perineal area, Right upper leg, Left upper leg Body parts bathed by helper: Right lower leg, Left lower leg, Back, Buttocks  Function- Upper Body Dressing/Undressing What is the patient wearing?: Pull over shirt/dress Pull over shirt/dress - Perfomed by patient: Put head through opening, Thread/unthread left sleeve, Thread/unthread right sleeve Pull over shirt/dress - Perfomed by helper: Pull shirt over trunk Function - Lower Body Dressing/Undressing What is the patient wearing?: Non-skid slipper socks, Pants Position: Bed Pants- Performed by patient: Thread/unthread right pants leg, Thread/unthread left pants leg, Pull pants up/down Non-skid slipper socks- Performed by helper: Don/doff right sock, Don/doff left sock  Function - Toileting Toileting activity did not occur: Safety/medical concerns     Function - Chair/bed transfer Chair/bed transfer method: Stand pivot Chair/bed transfer assist level: Touching or steadying assistance (Pt > 75%) Chair/bed transfer details: Verbal cues for technique, Manual facilitation for weight shifting, Verbal cues for sequencing, Verbal cues for precautions/safety  Function - Locomotion: Wheelchair Will patient use wheelchair at discharge?: Yes Type: Manual Max wheelchair distance: 50 Assist Level: Touching or steadying assistance (Pt > 75%) Wheel 150 feet activity did not occur: Safety/medical concerns (DOE- propel x 50') Assist Level: Touching or steadying assistance (Pt > 75%) Turns around,maneuvers to table,bed, and toilet,negotiates 3% grade,maneuvers on rugs and over doorsills: No Function - Locomotion: Ambulation Assistive device: No device Max distance: 30 Assist level: Touching or steadying assistance (Pt > 75%) Assist level: Touching or steadying assistance (Pt > 75%) Walk 50 feet  with 2 turns activity did not occur: Safety/medical concerns (DOE on exertion ; gait x 30') Walk 150 feet activity did not occur: Safety/medical concerns (DOE - gait x 30') Assist level: Touching or steadying assistance (Pt > 75%)  Function - Comprehension Comprehension: Auditory Comprehension assist level: Follows basic conversation/direction with no assist  Function - Expression Expression: Verbal Expression assist level: Expresses basic needs/ideas: With extra time/assistive device  Function - Social Interaction Social Interaction assist level: Interacts appropriately 75 - 89% of the time - Needs redirection for appropriate language or to initiate interaction.  Function - Problem Solving Problem solving assist level: Solves basic 25 - 49% of the time - needs direction more than half the time to initiate, plan or complete simple activities  Function - Memory Memory assist level: Recognizes or recalls 25 - 49% of the time/requires cueing 50 - 75% of the time Patient normally able to recall (first 3 days only): Current season, That  he or she is in a hospital  Medical Problem List and Plan: 1.  Right sided weakness and dysarthria secondary to left corona radiata infarct as well as remote right MCA territory infarct- initiate CIR 2.  DVT Prophylaxis/Anticoagulation: Subcutaneous Lovenox. Monitor platelet counts and any signs of bleeding 3. Pain Management: Tylenol as needed 4. Mood/bipolar disorder. No present medications. Plan to discuss with sister. 5. Neuropsych: This patient is capable of making decisions on his own behalf. 6. Skin/Wound Care: Routine skin checks 7. Fluids/Electrolytes/Nutrition: Routine I&O , CMET with mild hypoalbuminemia,add prostat, 60-90% intake 8. Hypertension. Lisinopril 40 mg daily. Monitor with increased mobility, will add amlodipine 9. Hyperlipidemia. Lipitor 10. Remote history of cocaine abuse. Urine drug screen negative. 11. Positive urine study  11/11/2015 with mild leukocytosis. Cipro initiated 11/12/2015,UCx neg will d/c cipro 12.  Labs reviewed , mild leukocytosis no fever  13.  Bowel and Bladder incont- likely due to poor awareness, will need toileting program LOS (Days) 2 A FACE TO FACE EVALUATION WAS PERFORMED  Cong Hightower E 11/17/2015, 6:55 AM

## 2015-11-17 NOTE — Progress Notes (Signed)
Physical Therapy Session Note  Patient Details  Name: Dominic HoughRonald E Brundage MRN: [id number] Date of Birth: [birth year]  Today's Date: 11/17/2015 PT Individual Time: 0800-0900 PT Individual Time Calculation (min): 60 min   Short Term Goals: Week 1:  PT Short Term Goal 1 (Week 1): = LTGs due to ELOS  Skilled Therapeutic Interventions/Progress Updates:   No pain reported. Pt performed bed mobility for assistance to don brief and pants in bed; wearing condom cath which he will use 24/7 until bladder program started. Pt washed hands at sink with max multi modal cues.  Sister arrived who stated pt walked short distances without AD at home and in community.  He has had B and B incontinence since previous CVA.  Pt sat EOB to finish eating breakfast, supervision.  Gait on level tile x 40' with turns, x 1870' with turns, wide RW.    neuromuscular re-education via demo, VCs for R/L ankle pumps with extended knee in sitting, resisted bil hip adduction and hip internal rotation.   Pt left resting in w/c with all needs within reach.  Sister stated she would stay with him until next therapist entered.     Therapy Documentation Precautions:  Precautions Precautions: Fall Restrictions Weight Bearing Restrictions: No   See Function Navigator for Current Functional Status.   Therapy/Group: Individual Therapy  Analuisa Tudor 11/17/2015, 10:53 AM

## 2015-11-18 ENCOUNTER — Inpatient Hospital Stay (HOSPITAL_COMMUNITY): Payer: Commercial Managed Care - HMO

## 2015-11-18 ENCOUNTER — Inpatient Hospital Stay (HOSPITAL_COMMUNITY): Payer: Commercial Managed Care - HMO | Admitting: Physical Therapy

## 2015-11-18 ENCOUNTER — Inpatient Hospital Stay (HOSPITAL_COMMUNITY): Payer: No Typology Code available for payment source | Admitting: Speech Pathology

## 2015-11-18 ENCOUNTER — Inpatient Hospital Stay (HOSPITAL_COMMUNITY): Payer: Commercial Managed Care - HMO | Admitting: Occupational Therapy

## 2015-11-18 NOTE — Progress Notes (Signed)
Social Work Assessment and Plan  Patient Details  Name: Dominic Pineda MRN: [id number] Date of Birth: 1950/12/18  Today's Date: 11/16/2015  Problem List:  Patient Active Problem List   Diagnosis Date Noted  . CVA (cerebral vascular accident) (HCC) 11/15/2015  . Essential hypertension   . Hemiparesis affecting left side as late effect of stroke (HCC)   . Bipolar affective disorder in remission (HCC)   . Benign essential HTN   . History of CVA (cerebrovascular accident)   . Acute blood loss anemia   . AKI (acute kidney injury) (HCC)   . Acute cerebrovascular accident (CVA) (HCC) 11/12/2015  . Substance abuse   . Severe hypertension 11/11/2015  . Stroke (HCC)   . Urinary tract infectious disease   . Facial droop 10/30/2011  . CKD (chronic kidney disease) 10/30/2011  . UTI (lower urinary tract infection) 10/30/2011  . Obstructive uropathy 09/28/2011  . ARF (acute renal failure) (HCC) 09/23/2011  . Diarrhea 09/23/2011  . Nausea & vomiting 09/23/2011  . Hyperkalemia 09/23/2011  . Polyuria 09/23/2011  . Hypertensive emergency 09/23/2011  . Bipolar 1 disorder (HCC) 09/23/2011  . Shuffling gait 09/23/2011  . Weakness generalized 09/23/2011  . H/O cocaine abuse 09/23/2011  . Hypertension   . Incontinence    Past Medical History:  Past Medical History  Diagnosis Date  . Hypertension   . Bipolar 1 disorder (HCC)   . Incontinence   . Mental disorder   . Depression   . Anxiety   . Blood transfusion 1970's  . Shortness of breath     has had a recent increase in episodes of shortness of breath and hyperventilating  . Stroke [facility]) 2009-2010    "have had 3 strokes"; denies residual  . BPH (benign prostatic hyperplasia)   . H/O acute renal failure 08/2011    severe w/hematuria  . Cocaine abuse    Past Surgical History:  Past Surgical History  Procedure Laterality Date  . Incision and drainage of wound      right knee "got piece of wire in it while mowing"  . Inguinal  hernia repair      left  . Tonsillectomy      "as a child"   Social History:  reports that he quit smoking about 29 years ago. His smoking use included Cigarettes. He has a 18 pack-year smoking history. He has never used smokeless tobacco. He reports that he drinks alcohol. He reports that he uses illicit drugs ("Crack" cocaine and Marijuana).  Family / Support Systems Marital Status: Divorced Patient Roles: Parent, Other (Comment) (brother, uncle) Children: Gordy Councilman - dtr - [phone] Other Supports: Bonnell Public - sister - [phone] Anticipated Caregiver: Sister Ability/Limitations of Caregiver: Sister and extended family can assist Caregiver Availability: 24/7 Family Dynamics: supportive family  Social History Preferred language: English Religion: [employer] Read: Yes Write: Yes Employment Status: Disabled Date Retired/Disabled/Unemployed: Oceanographer Issues: none reported Guardian/Conservator: N/A - MD has stated that pt is able to make his own decisions.   Abuse/Neglect Physical Abuse: Denies Verbal Abuse: Denies Sexual Abuse: Denies Exploitation of patient/patient's resources: Denies Self-Neglect: Denies  Emotional Status Pt's affect, behavior and adjustment status: Pt is motivated to work hard to get stronger. Recent Psychosocial Issues: none reported Psychiatric History: Pt with Bipolar Disorder and self reported "nervous breakdown".  Goes to the [facility] for management of this. Substance Abuse History: none reported by pt/sister, but CSW noted hx in pt's chart  Patient /  Family Perceptions, Expectations & Goals Pt/Family understanding of illness & functional limitations: Pt/sister seem to have a good understanding of pt's condition. Premorbid pt/family roles/activities: Pt is not very active at home.  Sister reports he watches TV and goes out with her every once in a while. Anticipated changes in roles/activities/participation:  Pt wants to resume activities as he is able. Pt/family expectations/goals: Pt wants to get back home.  Community Resources [employer]: Other (Comment) ([facility] Administration) Premorbid Home Care/DME Agencies: Other (Comment) (standard walker; shower chair;  Sister initiated services from the [facility] - [facility] for personal care 2hrs/ day 3 days/week) Transportation available at discharge: family Resource referrals recommended: Neuropsychology, Support group (specify)  Discharge Planning Living Arrangements: Other relatives Support Systems: Other relatives, Children Type of Residence: Private residence Insurance Resources: Media planner (specify) ([employer] Medicare) Financial Resources: SSD Financial Screen Referred: No Living Expenses: Lives with family Money Management: Patient, Family Does the patient have any problems obtaining your medications?: No Home Management: Pt's sister and niece take care of this. Patient/Family Preliminary Plans: Pt plans to go home with his family.  Sister is having surgery soon, but she feels other family members will be able to be with pt. Barriers to Discharge: Steps Social Work Anticipated Follow Up Needs: HH/OP Expected length of stay: 7-10 days  Clinical Impression CSW met with pt and later spoke with his sister via telephone to introduce self and role of CSW, as well as to complete assessment.  Pt is motivated to rehabilitate and sister is so appreciative that pt could come to CIR instead of having to go to a SNF.  She has been caring for pt with other family members for a while and will continue to do so.  She is planning to have some outpt surgery herself, but feels other family members can care for pt while she is recovering.  CSW explained that pt will need 24/7 supervision.  She stated that she will usually get him set up and then she can run a quick hour errand and he will not get up while she is gone.  CSW stated that pt will need more  supervision than before.  She expressed understanding.  Pt gets all of his medical care at the [facility] in [place].  Sister has already [facility] and they added personal care services 2 hours a day for 3 days a week from [facility].  CSW will continue to follow and assist as needed.  Dylan Ruotolo, [place] 11/17/2015, 2:09 PM

## 2015-11-18 NOTE — Progress Notes (Signed)
Occupational Therapy Session Note  Patient Details  Name: Wyonia HoughRonald E Horton MRN: [id number] Date of Birth: 1950-08-03  Today's Date: 11/18/2015 OT Individual Time: 1445-1515 OT Individual Time Calculation (min): 30 min    Short Term Goals: Week 1:  OT Short Term Goal 1 (Week 1): Pt will complete LB selfcare with supervision sit to stand using AE PRN. OT Short Term Goal 2 (Week 1): Pt will complete toilet transfers with supervision using LRAD. OT Short Term Goal 3 (Week 1): Pt will complete shower transfers with supervision using LRAD. OT Short Term Goal 4 (Week 1): Pt will tolerate standing for at least 3 mins during grooming tasks at the sink.   Skilled Therapeutic Interventions/Progress Updates:  Upon entering the room, pt seated in recliner chair with sister present in the room. Pt requiring encouragement to participate in therapy session with pt stating, "I am weak." Pt required coaxing for sit <>stand from recliner chair with min A and use of RW with min cues for proper technique. Pt engaged in B UE strengthening exercises with use of orange, level 2 theraband. OT educating and demonstrating exercises with pt returning demonstration with min verbal and tactile cues for proper technique. Pt stopped in the middle of exercise with saliva coming from mouth and eyes rolling back. PA alerted.Quick release belt donned and family remaining within the room.   Therapy Documentation Precautions:  Precautions Precautions: Fall Restrictions Weight Bearing Restrictions: No General:   Vital Signs: Therapy Vitals Temp: 97.7 F (36.5 C) Temp Source: Oral Pulse Rate: 67 Resp: 18 BP: 136/72 mmHg Patient Position (if appropriate): Sitting Oxygen Therapy SpO2: 97 % O2 Device: Not Delivered Pain: Pain Assessment Pain Assessment: No/denies pain  See Function Navigator for Current Functional Status.   Therapy/Group: Individual Therapy  Lowella Gripittman, Devere Brem L 11/18/2015, 3:37 PM

## 2015-11-18 NOTE — IPOC Note (Signed)
Overall Plan of Care [facility]) Patient Details Name: SHAHEED SCHMUCK MRN: [id number] DOB: 08/19/1950  Admitting Diagnosis: L  CVA  Hospital Problems: Principal Problem:   CVA (cerebral vascular accident) (HCC) Active Problems:   Hemiparesis affecting left side as late effect of stroke (HCC)   Acute blood loss anemia   Essential hypertension     Functional Problem List: Nursing Edema, Endurance, Medication Management, Nutrition, Pain, Perception, Safety, Skin Integrity, Bladder  PT Balance, Endurance, Motor, Safety  OT Balance, Cognition, Endurance, Motor, Safety  SLP Cognition  TR         Basic ADL's: OT Grooming, Bathing, Dressing, Toileting     Advanced  ADL's: OT       Transfers: PT Bed Mobility, Bed to Chair, Car, Occupational psychologist, Research scientist (life sciences): PT Ambulation, Psychologist, prison and probation services, Stairs     Additional Impairments: OT None  SLP Social Cognition   Memory, Attention, Awareness, Problem Solving  TR      Anticipated Outcomes Item Anticipated Outcome  Self Feeding independent  Swallowing      Basic self-care  supervision  Toileting  supervision   Bathroom Transfers supervision  Bowel/Bladder  mod assist  Transfers  supervision  Locomotion  supervision w/c x 150' (for L attention); supervision gait x 150' controlled env and community, x 50' home env; up/down 12 steps  Communication     Cognition  min assist for basic   Pain  <3  Safety/Judgment  min assist   Therapy Plan: PT Intensity: Minimum of 1-2 x/day ,45 to 90 minutes PT Frequency: 5 out of 7 days PT Duration Estimated Length of Stay: 10-12 OT Intensity: Minimum of 1-2 x/day, 45 to 90 minutes OT Frequency: 5 out of 7 days OT Duration/Estimated Length of Stay: 10-12 days SLP Intensity: Minumum of 1-2 x/day, 30 to 90 minutes SLP Frequency: 1 to 3 out of 7 days SLP Duration/Estimated Length of Stay: 5-7 days for ST        Team Interventions: Nursing Interventions  Patient/Family Education, Bladder Management, Disease Management/Prevention, Pain Management, Medication Management, Skin Care/Wound Management, Discharge Planning, Psychosocial Support  PT interventions Ambulation/gait training, Balance/vestibular training, Cognitive remediation/compensation, Discharge planning, Community reintegration, DME/adaptive equipment instruction, Functional electrical stimulation, Functional mobility training, Patient/family education, Neuromuscular re-education, Psychosocial support, Splinting/orthotics, Pain management, Stair training, Therapeutic Activities, Therapeutic Exercise, UE/LE Coordination activities, UE/LE Strength taining/ROM, Wheelchair propulsion/positioning  OT Interventions Balance/vestibular training, Cognitive remediation/compensation, Discharge planning, Functional mobility training, DME/adaptive equipment instruction, Neuromuscular re-education, Self Care/advanced ADL retraining, Pain management, Patient/family education, Therapeutic Activities, UE/LE Coordination activities, UE/LE Strength taining/ROM, Therapeutic Exercise, Psychosocial support, Community reintegration  SLP Interventions Cognitive remediation/compensation, Financial trader, Functional tasks, Internal/external aids, Patient/family education, Environmental controls  TR Interventions    SW/CM Interventions Discharge Planning, Psychosocial Support, Patient/Family Education    Team Discharge Planning: Destination: PT-Home ,OT-   , SLP-Home Projected Follow-up: PT-Home health PT, OT-  Home health OT, SLP-None Projected Equipment Needs: PT-To be determined, OT- To be determined, SLP-None recommended by SLP Equipment Details: PT-owns a SPC which he did not use, OT-  Patient/family involved in discharge planning: PT- Patient,  OT-Patient, SLP-Patient  MD ELOS: [facility] Prognosis:  Good Assessment: 65 y.o. right handed male with history of bipolar disorder, hypertension, CVA 2009  maintained on aspirin as well as prior cocaine use. Per chart review patient lives with sister and one level home with 2 steps to entry. He used a cane prior to admission. Presented 11/11/2015 with new onset  of slurred speech and right-sided weakness.Urine drug screen negative. MRI of the brain showed acute subcentimeter left corona radiata infarct as well as old right MCA territory infarct. Old small cerebellar infarcts. Small area of left occipital lobe encephalomalacia. Echocardiogram with ejection fraction 65% grade 1 diastolic dysfunction. 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 Patient did not receive TPA. Neurology follow-up currently maintained on aspirin therapy as well as the addition of Plavix  Now requiring 24/7 Rehab RN,MD, as well as CIR level PT, OT and SLP.  Treatment team will focus on ADLs and mobility with goals set at Supervision    See Team Conference Notes for weekly updates to the plan of care

## 2015-11-18 NOTE — Progress Notes (Signed)
Occupational Therapy Session Note  Patient Details  Name: Wyonia HoughRonald E Deaton MRN: [id number] Date of Birth: [birth year]  Today's Date: 11/18/2015 OT Individual Time: 1300-1345 OT Individual Time Calculation (min): 45 min    Short Term Goals: Week 1:  OT Short Term Goal 1 (Week 1): Pt will complete LB selfcare with supervision sit to stand using AE PRN. OT Short Term Goal 2 (Week 1): Pt will complete toilet transfers with supervision using LRAD. OT Short Term Goal 3 (Week 1): Pt will complete shower transfers with supervision using LRAD. OT Short Term Goal 4 (Week 1): Pt will tolerate standing for at least 3 mins during grooming tasks at the sink.   Skilled Therapeutic Interventions/Progress Updates:    Treatment session with focus on sit > stand and standing tolerance.  Engaged in Dynavision with focus on sit > stand, standing tolerance, and initiation.  Initially required mod assist for sit > stand but with tactile and verbal cues for anterior weight shift and increased positioning of BUE pt able to progress to min assist with sit > stand.  Completed Dynavision Mode A in standing with pt selecting 32 and 30 lights in 60 second trials; increased challenge to 2 second time limit with pt completed 22/35 in 60 seconds and 48/72 in 120 seconds.  Pt tolerated standing 2-3 min bouts throughout task with min guard and UE support.    Therapy Documentation Precautions:  Precautions Precautions: Fall Restrictions Weight Bearing Restrictions: No General:   Vital Signs: Therapy Vitals Temp: 97.7 F (36.5 C) Temp Source: Oral Pulse Rate: 67 Resp: 18 BP: 136/72 mmHg Patient Position (if appropriate): Sitting Oxygen Therapy SpO2: 97 % O2 Device: Not Delivered Pain: Pain Assessment Pain Assessment: No/denies pain  See Function Navigator for Current Functional Status.   Therapy/Group: Individual Therapy  Rosalio LoudHOXIE, Yareth Macdonnell 11/18/2015, 3:37 PM

## 2015-11-18 NOTE — Progress Notes (Signed)
Speech Language Pathology Daily Session Note  Patient Details  Name: Dominic Pineda MRN: [id number] Date of Birth: [birth year]  Today's Date: 11/18/2015 SLP Individual Time: [id number] SLP Individual Time Calculation (min): 45 min  Short Term Goals: Week 1: SLP Short Term Goal 1 (Week 1): STG=LTG due to ELOS for SLP   Skilled Therapeutic Interventions:  Pt was seen for skilled ST targeting cognitive goals.  Pt required mod assist verbal cues for task sequencing and initiation during basic familiar self care tasks.  SLP utilized the Dynavision to target sustained attention to task and task initiation.  Initially pt required max assist verbal and visual cues to locate 12/22 targets with an average reaction time of 2.31 seconds.  However, after 4 trials pt's accuracy for target location improved to 32/34 with an average reaction time of 1.64 seconds and SLP was able to fade assist level to min assist verbal cues only.  Pt required max assist for wayfinding when being propelled in wheelchair to room.  Pt able to use his daily schedule to orient himself to basic daily information with mod-max assist verbal and visual cues to locate information due to decreased sustained attention to task.  Pt left in wheelchair with quick release belt donned and call bell left within reach.  Continue per current plan of care.   Function:  Eating Eating                 Cognition Comprehension Comprehension assist level: Understands basic 90% of the time/cues < 10% of the time  Expression   Expression assist level: Expresses basic 90% of the time/requires cueing < 10% of the time.  Social Interaction Social Interaction assist level: Interacts appropriately 75 - 89% of the time - Needs redirection for appropriate language or to initiate interaction.  Problem Solving Problem solving assist level: Solves basic 50 - 74% of the time/requires cueing 25 - 49% of the time  Memory Memory assist level: Recognizes or  recalls 25 - 49% of the time/requires cueing 50 - 75% of the time    Pain Pain Assessment Pain Assessment: No/denies pain  Therapy/Group: Individual Therapy  Kanylah Muench, Melanee SpryNicole L 11/18/2015, 12:12 PM

## 2015-11-18 NOTE — Progress Notes (Signed)
Physical Therapy Session Note  Patient Details  Name: Dominic Pineda MRN: [id number] Date of Birth: Feb 23, 1951  Today's Date: 11/18/2015 PT Individual Time: 1015-1130 PT Individual Time Calculation (min): 75 min   Short Term Goals: Week 1:  PT Short Term Goal 1 (Week 1): = LTGs due to ELOS  Skilled Therapeutic Interventions/Progress Updates:    Pt received in recliner, denying c/o pain, & agreeable to PT. Gait training x 15 ft with RW & steady A but pt's briefs fell down & pt returned to room & tolerated standing with BUE support ~3 minutes to allow PT to don new brief.  PT administered Berg balance test & pt scored 29/56. Patient demonstrates increased fall risk as noted by score of 29/56 on Berg Balance Scale.  (<36= high risk for falls, close to 100%; 37-45 significant >80%; 46-51 moderate >50%; 52-55 lower >25%). (Please see individual scores of Berg tasks in chart). Pt appears to self limit himself as he repeatedly stated "I need to sit" during each & between each activity. During balance testing pt's brief fell down again & PT assisted pt with donning elastic waistband brief. Balance testing also appears to be limited by pt's decreased cognition as PT had to provide multimodal cuing & one-step instructions for some tasks & pt still had difficulty following commands. Pt's sister called him on telephone during session & requested to speak to PT; pt's sister Dominic Pineda inquired if pt had put on clothes today & remarked that his speech seemed more slurred to her today. This PT followed up with RN & SLP who both reported pt's speech did not appear different from yesterday. Gait training over uneven surface without AD with Steady A & maximum cuing not to reach & try to hold on to furniture.  Pt completed 5x sit-to-stand for 2 trials for BLE strengthening with extremely elevated surface & then with the surface lowered a couple of inches. Pt required maximum encouragement to attempt transfer without BUE  support and when pt attempted he was able to complete transfer. Gait training x 120 ft with RW & steady A; pt requires cuing for turning & demonstrates shuffled gait pattern. At end of session pt left in recliner with QRB in place, BLE elevated & all needs within reach. PT reviewed use of call bell & pt able to demonstrate which button to push.    Therapy Documentation Precautions:  Precautions Precautions: Fall Restrictions Weight Bearing Restrictions: No Pain: Pain Assessment Pain Assessment: No/denies pain   See Function Navigator for Current Functional Status.   Therapy/Group: Individual Therapy  Sandi MariscalVictoria M Tyton Abdallah 11/18/2015, 8:08 AM

## 2015-11-18 NOTE — Progress Notes (Signed)
Subjective/Complaints: No isues overnite, didn't have good appetite yesterday ROS-No CP or SOB, feels week legs and arms Objective: Vital Signs: Blood pressure 131/55, pulse 66, temperature 98 F (36.7 C), temperature source Oral, resp. rate 19, height _0  (1.778 m), weight 127.85 kg (281 lb 13.7 oz), SpO2 95 %. No results found. Results for orders placed or performed during the hospital encounter of 11/15/15 (from the past 72 hour(s))  CBC WITH DIFFERENTIAL     Status: Abnormal   Collection Time: 11/16/15  4:48 AM  Result Value Ref Range   WBC 11.1 (H) 4.0 - 10.5 K/uL   RBC 4.63 4.22 - 5.81 MIL/uL   Hemoglobin 13.2 13.0 - 17.0 g/dL   HCT 39.8 39.0 - 52.0 %   MCV 86.0 78.0 - 100.0 fL   MCH 28.5 26.0 - 34.0 pg   MCHC 33.2 30.0 - 36.0 g/dL   RDW 14.4 11.5 - 15.5 %   Platelets 226 150 - 400 K/uL   Neutrophils Relative % 34 %   Lymphocytes Relative 59 %   Monocytes Relative 5 %   Eosinophils Relative 2 %   Basophils Relative 0 %   Neutro Abs 3.8 1.7 - 7.7 K/uL   Lymphs Abs 6.5 (H) 0.7 - 4.0 K/uL   Monocytes Absolute 0.6 0.1 - 1.0 K/uL   Eosinophils Absolute 0.2 0.0 - 0.7 K/uL   Basophils Absolute 0.0 0.0 - 0.1 K/uL   WBC Morphology ATYPICAL LYMPHOCYTES   Comprehensive metabolic panel     Status: Abnormal   Collection Time: 11/16/15  4:48 AM  Result Value Ref Range   Sodium 138 135 - 145 mmol/L   Potassium 4.1 3.5 - 5.1 mmol/L   Chloride 103 101 - 111 mmol/L   CO2 27 22 - 32 mmol/L   Glucose, Bld 164 (H) 65 - 99 mg/dL   BUN 12 6 - 20 mg/dL   Creatinine, Ser 1.31 (H) 0.61 - 1.24 mg/dL   Calcium 8.9 8.9 - 10.3 mg/dL   Total Protein 6.3 (L) 6.5 - 8.1 g/dL   Albumin 3.1 (L) 3.5 - 5.0 g/dL   AST 20 15 - 41 U/L   ALT 13 (L) 17 - 63 U/L   Alkaline Phosphatase 97 38 - 126 U/L   Total Bilirubin 1.1 0.3 - 1.2 mg/dL   GFR calc non Af Amer 55 (L) >60 mL/min   GFR calc Af Amer >60 >60 mL/min    Comment: (NOTE) The eGFR has been calculated using the CKD EPI equation. This  calculation has not been validated in all clinical situations. eGFR's persistently <60 mL/min signify possible Chronic Kidney Disease.    Anion gap 8 5 - 15     HEENT: normal Cardio: RRR Resp: CTA B/L and unlabored GI: BS positive and NT, ND Extremity:  No Edema Skin:   Intact Neuro: Lethargic, Normal Sensory, Abnormal Motor right Delt bi tri grip 4/5, R HF 4/5 R ADF 4/5 , left side 5/5, normal tone and Abnormal FMC Ataxic/ dec FMC Musc/Skel:  Other no pain with UE or LE ROM Gen NAD   Assessment/Plan: 1. 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 Physiatrist is providing close team supervision and 24 hour management of active medical problems listed below. Physiatrist and rehab team continue to assess barriers to discharge/monitor patient progress toward functional and medical goals. FIM: Function - Bathing Position:  Shower Body parts bathed by patient: Right arm, Left arm, Chest, Abdomen, Front perineal area, Right upper leg, Left upper leg Body parts bathed by helper: Right lower leg, Left lower leg, Back, Buttocks  Function- Upper Body Dressing/Undressing What is the patient wearing?: Pull over shirt/dress Pull over shirt/dress - Perfomed by patient: Put head through opening, Thread/unthread left sleeve, Thread/unthread right sleeve Pull over shirt/dress - Perfomed by helper: Pull shirt over trunk Function - Lower Body Dressing/Undressing What is the patient wearing?: Non-skid slipper socks, Pants Position: Bed Pants- Performed by patient: Thread/unthread right pants leg, Thread/unthread left pants leg, Pull pants up/down Non-skid slipper socks- Performed by helper: Don/doff right sock, Don/doff left sock  Function - Toileting Toileting activity did not occur: Safety/medical concerns     Function - Chair/bed transfer Chair/bed  transfer method: Stand pivot Chair/bed transfer assist level: Touching or steadying assistance (Pt > 75%) Chair/bed transfer details: Verbal cues for technique, Manual facilitation for weight shifting, Verbal cues for sequencing, Verbal cues for precautions/safety  Function - Locomotion: Wheelchair Will patient use wheelchair at discharge?: Yes Type: Manual Max wheelchair distance: 50 Assist Level: Touching or steadying assistance (Pt > 75%) Wheel 150 feet activity did not occur: Safety/medical concerns (DOE- propel x 50') Assist Level: Touching or steadying assistance (Pt > 75%) Turns around,maneuvers to table,bed, and toilet,negotiates 3% grade,maneuvers on rugs and over doorsills: No Function - Locomotion: Ambulation Assistive device: No device Max distance: 70 Assist level: Touching or steadying assistance (Pt > 75%) Assist level: Touching or steadying assistance (Pt > 75%) Walk 50 feet with 2 turns activity did not occur: Safety/medical concerns (DOE on exertion ; gait x 30') Assist level: Touching or steadying assistance (Pt > 75%) Walk 150 feet activity did not occur: Safety/medical concerns (DOE - gait x 30') Assist level: Touching or steadying assistance (Pt > 75%)  Function - Comprehension Comprehension: Auditory Comprehension assist level: Follows basic conversation/direction with extra time/assistive device  Function - Expression Expression: Verbal Expression assist level: Expresses basic needs/ideas: With extra time/assistive device  Function - Social Interaction Social Interaction assist level: Interacts appropriately 75 - 89% of the time - Needs redirection for appropriate language or to initiate interaction.  Function - Problem Solving Problem solving assist level: Solves basic 25 - 49% of the time - needs direction more than half the time to initiate, plan or complete simple activities  Function - Memory Memory assist level: Recognizes or recalls 25 - 49% of the  time/requires cueing 50 - 75% of the time Patient normally able to recall (first 3 days only): Current season, That he or she is in a hospital  Medical Problem List and Plan: 1.  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  DVT Prophylaxis/Anticoagulation: Subcutaneous Lovenox. Monitor platelet counts and any signs of bleeding 3. Pain Management: Tylenol as needed 4. Mood/bipolar disorder. No present medications. Plan to discuss with sister. 5. Neuropsych: This patient is capable of making decisions on his own behalf. 6. Skin/Wound Care: Routine skin checks 7. Fluids/Electrolytes/Nutrition: Routine I&O , CMET with mild hypoalbuminemia,add prostat, 0-75% intake 8. Hypertension. Lisinopril 40 mg daily. Monitor with increased mobility,increased calan SR to 352m last noc, am systolic 11619. Hyperlipidemia. Lipitor 10. Remote history of cocaine abuse. Urine drug screen negative. 11. Positive urine study 11/11/2015 with mild leukocytosis. Cipro initiated 11/12/2015,UCx neg will d/c cipro 12.  Labs reviewed , mild leukocytosis no fever  13.  Bowel and Bladder incont- likely due to poor awareness, will need toileting program LOS (Days) 3 A FACE TO FACE EVALUATION WAS PERFORMED  Dominic Pineda E 11/18/2015, 7:00 AM

## 2015-11-19 ENCOUNTER — Inpatient Hospital Stay (HOSPITAL_COMMUNITY): Payer: Commercial Managed Care - HMO | Admitting: Occupational Therapy

## 2015-11-19 ENCOUNTER — Ambulatory Visit (HOSPITAL_COMMUNITY): Payer: No Typology Code available for payment source

## 2015-11-19 ENCOUNTER — Inpatient Hospital Stay (HOSPITAL_COMMUNITY): Payer: No Typology Code available for payment source

## 2015-11-19 NOTE — Progress Notes (Signed)
Physical Therapy Session Note  Patient Details  Name: Dominic Pineda MRN: [id number] Date of Birth: March 21, 1951  Today's Date: 11/19/2015 PT Individual Time: 1100-1200; [id number] PT Individual Time Calculation (min): 60 min , 60 min  Short Term Goals: Week 1:  PT Short Term Goal 1 (Week 1): = LTGs due to ELOS  Skilled Therapeutic Interventions/Progress Updates:   tx 1:  Pt unable to form words today, or answer questions.  Drooling noted.  PT discussed with RN, who stated CT yesterday was unchanged. Pt having greater difficulty following directions. No pain indicated.PT donned pt's shoes.    W/c> NuStep with min assist, RW.  Once on the NuStep, pt uncooperative using it, basical refusing.  NuStep > RW for gait on level tile x 100' x 2 x 20' x 2 ,x 125' iwht min guard to keep pants from falling down, and max VCS to continue when pt tried to sit on next available surface.  Pt's condom cath fell off during gait. Up/down 6" curb/step with RW forward.  Attempted descent backwards, as pt has 2 STE at home without rail, but pt unable to understand.  Seated peg activity using R hand; pt unable to match colors 50 % of time.  When asked to name or match colors, pt grunted; increased drooling with effort. Standing endurance x 5 minutes while folding towels and pillow cases onto table in front, using bil hands spontaneously, with hand over hand instruction fading to min cues.    Sit>< stand from firm mat to match playing cards on vertical board in front of pt, x 7, using bil hands with wide BOS.  Pt needed max multimodal ues to match cards initially but eventually understood. Max multimodal cues for safe placement of hands, pt pt continues to attempt to reach and pull forward with bil hands, q trial.  Gait to return to room; route finding without cues.  Pt left with RN in attendance to change brief which had fallen off in his pants, to pull-up style which pt's sister had brought in.  PT informed RN of  draining catheter bag into urinal for measurement, before start of tx.  Tx 2:  Pt denied pain; more alert and more understandable for 1 word utterances.     Gait group with 1 other pt.  Session focused on gait on level tile, steps with 2 rails, up/down ramp and curb, and stepping over obstacles.  Pt also retrieved 1 item from floor.  Recliner >< w/c with min tactile and VCs.  Pt tends to reach for RW with R hand, and push up with L hand, safely this PM.   Pt returned to room, left resting in recliner with quick release belt applied.     Therapy Documentation Precautions:  Precautions Precautions: Fall Restrictions Weight Bearing Restrictions: No    See Function Navigator for Current Functional Status.   Therapy/Group: Individual Therapy  Abbygale Lapid 11/19/2015, 12:15 PM

## 2015-11-19 NOTE — Progress Notes (Signed)
Dominic Pineda is a 65 y.o. male 07/30/1951 [id number]  Subjective: No new complaints. Feeling OK.  Objective: Vital signs in last 24 hours: Temp:  [97.7 F (36.5 C)-97.8 F (36.6 C)] 97.8 F (36.6 C) (04/22 0536) Pulse Rate:  [66-70] 66 (04/22 0536) Resp:  [18] 18 (04/22 0536) BP: (136-171)/(64-82) 144/64 mmHg (04/22 0536) SpO2:  [97 %] 97 % (04/22 0536) Weight change:  Last BM Date: 11/19/15  Intake/Output from previous day: 04/21 0701 - 04/22 0700 In: 720 [P.O.:720] Out: 575 [Urine:575] Last cbgs: CBG (last 3)  No results for input(s): GLUCAP in the last 72 hours.   Physical Exam General: No apparent distress   HEENT: not dry Lungs: Normal effort. Lungs clear to auscultation, no crackles or wheezes. Cardiovascular: Regular rate and rhythm, no edema Abdomen: S/NT/ND; BS(+) Musculoskeletal:  unchanged Neurological: No new neurological deficits Wounds: N/A    Skin: clear  Aging changes Mental state: Alert, dysarthric,cooperative    Lab Results: BMET    Component Value Date/Time   NA 138 11/16/2015 0448   K 4.1 11/16/2015 0448   CL 103 11/16/2015 0448   CO2 27 11/16/2015 0448   GLUCOSE 164* 11/16/2015 0448   BUN 12 11/16/2015 0448   CREATININE 1.31* 11/16/2015 0448   CALCIUM 8.9 11/16/2015 0448   GFRNONAA 55* 11/16/2015 0448   GFRAA >60 11/16/2015 0448   CBC    Component Value Date/Time   WBC 11.1* 11/16/2015 0448   RBC 4.63 11/16/2015 0448   HGB 13.2 11/16/2015 0448   HCT 39.8 11/16/2015 0448   PLT 226 11/16/2015 0448   MCV 86.0 11/16/2015 0448   MCH 28.5 11/16/2015 0448   MCHC 33.2 11/16/2015 0448   RDW 14.4 11/16/2015 0448   LYMPHSABS 6.5* 11/16/2015 0448   MONOABS 0.6 11/16/2015 0448   EOSABS 0.2 11/16/2015 0448   BASOSABS 0.0 11/16/2015 0448    Studies/Results: Ct Head Wo Contrast  11/18/2015  CLINICAL DATA:  65 year old male with new onset weakness EXAM: CT HEAD WITHOUT CONTRAST TECHNIQUE: Contiguous axial images were obtained from  the base of the skull through the vertex without intravenous contrast. COMPARISON:  Prior head CT 11/11/2015 and brain MRI 11/12/2015 FINDINGS: Negative for acute intracranial hemorrhage, acute infarction, mass, mass effect, hydrocephalus or midline shift. Gray-white differentiation is preserved throughout. Stable appearance of remote prior right posterior MCA territory infarct. 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 No focal soft tissue or calvarial abnormality. Normal aeration of the mastoid air cells and paranasal sinuses. IMPRESSION: 1. No acute intracranial abnormality. 2. Stable appearance of the brain compared to recent prior imaging. Electronically Signed   By: Malachy Moan M.D.   On: 11/18/2015 16:36    Medications: I have reviewed the patient's current medications.  Assessment/Plan:  1. 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 DVT Prophylaxis/Anticoagulation: Subcutaneous Lovenox. Monitor platelet counts and any signs of bleeding 3. Pain Management: Tylenol as needed 4. Mood/bipolar disorder. No present medications. Plan to discuss with sister. 5. Neuropsych: This patient is capable of making decisions on his own behalf. 6. Skin/Wound Care: Routine skin checks 7. Fluids/Electrolytes/Nutrition: Routine I&O , CMET with mild hypoalbuminemia 8. Hypertension. Lisinopril 40 mg daily. Monitor with increased mobility,increased calan SR to 360 mg 9. Hyperlipidemia. Lipitor 10. Remote history of cocaine abuse. Urine drug screen negative.  11. Positive urine study 11/11/2015 with mild leukocytosis. Pt took Cipro 12. Labs reviewed , mild leukocytosis no fever  13. Bowel and Bladder incont- likely due to poor awareness, will need toileting  program    Length of stay, days: 4  Sonda PrimesAlex Plotnikov , MD 11/19/2015, 11:31 AM

## 2015-11-19 NOTE — Progress Notes (Signed)
Occupational Therapy Session Note  Patient Details  Name: Dominic HoughRonald E Veronica MRN: [id number] Date of Birth: 28-May-1951  Today's Date: 11/19/2015 OT Individual Time:  - 0900-1000  (60 min)      Short Term Goals: Week 1:  OT Short Term Goal 1 (Week 1): Pt will complete LB selfcare with supervision sit to stand using AE PRN. OT Short Term Goal 2 (Week 1): Pt will complete toilet transfers with supervision using LRAD. OT Short Term Goal 3 (Week 1): Pt will complete shower transfers with supervision using LRAD. OT Short Term Goal 4 (Week 1): Pt will tolerate standing for at least 3 mins during grooming tasks at the sink.  Week 2:     Skilled Therapeutic Interventions/Progress Updates:    Treatment session with focus on sit > stand and standing tolerance. Pt lying in bed upon OT arrival.  Required increased time for supine to sit due to decreased initiation.  Pt was min assist with this; Mod assist for sit > stand but with tactile and verbal cues for anterior weight shift and increased positioning of BUE.  Pt transferred to wc with stand pivot.  Did bath at sink with directional cues for organizing, sequencing, and initiation.  Pt went from  sit > stand with min assist at the end of session.  Stood at sink for 8 minutes during bathing.with UEsupport.  Left pt in recliner with safety belt on and call bell,phone within reach.     Therapy Documentation Precautions:  Precautions Precautions: Fall Restrictions Weight Bearing Restrictions: No    Vital Signs: Therapy Vitals Temp: 97.8 F (36.6 C) Temp Source: Oral Pulse Rate: 66 Resp: 18 BP: (!) 144/64 mmHg Patient Position (if appropriate): Lying Oxygen Therapy SpO2: 97 % O2 Device: Not Delivered Pain:  none      :    See Function Navigator for Current Functional Status.   Therapy/Group: Individual Therapy  Humberto Sealsdwards, Mohammed Mcandrew J 11/19/2015, 7:49 AM

## 2015-11-19 NOTE — Progress Notes (Signed)
Using condom cath to manage incontinence, usually comes off once a shift. Without complaint of during night. Flat affect. Decreased initiation. Dominic MartinezMurray, Kristien Salatino A

## 2015-11-20 ENCOUNTER — Inpatient Hospital Stay (HOSPITAL_COMMUNITY): Payer: Commercial Managed Care - HMO

## 2015-11-20 DIAGNOSIS — I63039 Cerebral infarction due to thrombosis of unspecified carotid artery: Secondary | ICD-10-CM

## 2015-11-20 NOTE — Progress Notes (Signed)
Awake since 0300, watching TV, drinking juice and eating graham crackers. Reminded of time, but reports not sleepy. Slurred speech. Denies pain. Alfredo MartinezMurray, Twyla Dais A

## 2015-11-20 NOTE — Progress Notes (Signed)
Dominic Pineda is a 64 y.o. male [birth year] [id number]  Subjective: No new complaints. Feeling OK.  Objective: Vital signs in last 24 hours: Temp:  [97.8 F (36.6 C)-98.1 F (36.7 C)] 98.1 F (36.7 C) (04/23 0525) Pulse Rate:  [64-69] 69 (04/23 0525) Resp:  [17-18] 18 (04/23 0525) BP: (144-168)/(66-71) 154/69 mmHg (04/23 0525) SpO2:  [95 %-98 %] 95 % (04/23 0525) Weight change:  Last BM Date: 11/19/15  Intake/Output from previous day: 04/22 0701 - 04/23 0700 In: 480 [P.O.:480] Out: 350 [Urine:350] Last cbgs: CBG (last 3)  No results for input(s): GLUCAP in the last 72 hours.   Physical Exam General: NAD HEENT: not dry Lungs: Normal effort. Lungs clear to auscultation, no crackles or wheezes. Cardiovascular: Regular rate and rhythm, no edema Abdomen: S/NT/ND; BS(+) Musculoskeletal:  unchanged Neurological: No new neurological deficits Wounds: N/A    Skin: clear  Aging changes Mental state: Alert, cooperative    Lab Results: BMET    Component Value Date/Time   NA 138 11/16/2015 0448   K 4.1 11/16/2015 0448   CL 103 11/16/2015 0448   CO2 27 11/16/2015 0448   GLUCOSE 164* 11/16/2015 0448   BUN 12 11/16/2015 0448   CREATININE 1.31* 11/16/2015 0448   CALCIUM 8.9 11/16/2015 0448   GFRNONAA 55* 11/16/2015 0448   GFRAA >60 11/16/2015 0448   CBC    Component Value Date/Time   WBC 11.1* 11/16/2015 0448   RBC 4.63 11/16/2015 0448   HGB 13.2 11/16/2015 0448   HCT 39.8 11/16/2015 0448   PLT 226 11/16/2015 0448   MCV 86.0 11/16/2015 0448   MCH 28.5 11/16/2015 0448   MCHC 33.2 11/16/2015 0448   RDW 14.4 11/16/2015 0448   LYMPHSABS 6.5* 11/16/2015 0448   MONOABS 0.6 11/16/2015 0448   EOSABS 0.2 11/16/2015 0448   BASOSABS 0.0 11/16/2015 0448    Studies/Results: Ct Head Wo Contrast  11/18/2015  CLINICAL DATA:  65 year old male with new onset weakness EXAM: CT HEAD WITHOUT CONTRAST TECHNIQUE: Contiguous axial images were obtained from the base of the skull  through the vertex without intravenous contrast. COMPARISON:  Prior head CT 11/11/2015 and brain MRI 11/12/2015 FINDINGS: Negative for acute intracranial hemorrhage, acute infarction, mass, mass effect, hydrocephalus or midline shift. Gray-white differentiation is preserved throughout. Stable appearance of remote prior right posterior MCA territory infarct. 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 No focal soft tissue or calvarial abnormality. Normal aeration of the mastoid air cells and paranasal sinuses. IMPRESSION: 1. No acute intracranial abnormality. 2. Stable appearance of the brain compared to recent prior imaging. Electronically Signed   By: Malachy Moan M.D.   On: 11/18/2015 16:36    Medications: I have reviewed the patient's current medications.  Assessment/Plan:  1. 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 DVT Prophylaxis/Anticoagulation: Subcutaneous Lovenox. Monitor platelet counts and any signs of bleeding 3. Pain Management: Tylenol as needed 4. Mood/bipolar disorder. No present medications. Plan to discuss with sister. 5. Neuropsych: This patient is capable of making decisions on his own behalf. 6. Skin/Wound Care: Routine skin checks 7. Fluids/Electrolytes/Nutrition: Routine I&O , CMET with mild hypoalbuminemia 8. Hypertension. Lisinopril 40 mg daily. Monitor with increased mobility,increased calan SR to 360 mg 9. Hyperlipidemia. Lipitor 10. Remote history of cocaine abuse. Urine drug screen negative. 11. Positive urine  study 11/11/2015 with mild leukocytosis. Pt took Cipro 12. Labs reviewed , mild leukocytosis no fever  13. Bowel and Bladder incont- likely due to poor awareness, will need toileting program  Cont current  Rx    Length of stay, days: 5  Sonda PrimesAlex Plotnikov , MD 11/20/2015, 8:41 AM

## 2015-11-20 NOTE — Progress Notes (Signed)
Occupational Therapy Note  Patient Details  Name: Dominic Pineda MRN: [id number] Date of Birth: March 10, 1951  Today's Date: 11/20/2015 OT Individual Time: 1330-1400 OT Individual Time Calculation (min): 30 min   Pt denied pain Individual therapy  Pt resting in recliner upon arrival.  Pt's QRB was not secured around midsection but was resting behind his back.  RN confirmed that QRB was secured earlier.  Pt required extra time to initiate all tasks when requested.  Pt amb with RW to ADL apartment and engaged in functional amb with RW for simple home mgmt tasks and dynamic standing activities.  Pt's condom cath became dislodged, with no recognition by pt, and pt amb with RW back to room for RN to secure a new condom cath.  Pt is supervision for all standing tasks and functional amb with RW.    Dominic Pineda, Dominic Pineda 11/20/2015, 3:54 PM

## 2015-11-21 ENCOUNTER — Inpatient Hospital Stay (HOSPITAL_COMMUNITY): Payer: Commercial Managed Care - HMO

## 2015-11-21 ENCOUNTER — Inpatient Hospital Stay (HOSPITAL_COMMUNITY): Payer: Commercial Managed Care - HMO | Admitting: Occupational Therapy

## 2015-11-21 ENCOUNTER — Inpatient Hospital Stay (HOSPITAL_COMMUNITY): Payer: No Typology Code available for payment source | Admitting: Speech Pathology

## 2015-11-21 LAB — BASIC METABOLIC PANEL
ANION GAP: 10 (ref 5–15)
BUN: 18 mg/dL (ref 6–20)
CO2: 25 mmol/L (ref 22–32)
CREATININE: 1.37 mg/dL — AB (ref 0.61–1.24)
Calcium: 9.6 mg/dL (ref 8.9–10.3)
Chloride: 106 mmol/L (ref 101–111)
GFR calc non Af Amer: 52 mL/min — ABNORMAL LOW (ref >60)
Glucose, Bld: 128 mg/dL — ABNORMAL HIGH (ref 65–99)
Potassium: 4 mmol/L (ref 3.5–5.1)
SODIUM: 141 mmol/L (ref 135–145)

## 2015-11-21 LAB — GLUCOSE, CAPILLARY: Glucose-Capillary: 122 mg/dL — ABNORMAL HIGH (ref 65–99)

## 2015-11-21 LAB — CBC
HEMATOCRIT: 42.7 % (ref 39.0–52.0)
Hemoglobin: 13.9 g/dL (ref 13.0–17.0)
MCH: 28.1 pg (ref 26.0–34.0)
MCHC: 32.6 g/dL (ref 30.0–36.0)
MCV: 86.4 fL (ref 78.0–100.0)
PLATELETS: 218 10*3/uL (ref 150–400)
RBC: 4.94 MIL/uL (ref 4.22–5.81)
RDW: 14.1 % (ref 11.5–15.5)
WBC: 12 10*3/uL — AB (ref 4.0–10.5)

## 2015-11-21 MED ORDER — SODIUM CHLORIDE 0.45 % IV SOLN
INTRAVENOUS | Status: DC
  Administered 2015-11-21 – 2015-11-26 (×5): via INTRAVENOUS

## 2015-11-21 NOTE — Progress Notes (Signed)
RN called to gym at 1205 with pt having new left gaze, became nonverbal, pale, and diaphoretic during therapy. BP 150/11 HR 67 O2 93%. CBG 122. 1207 BP 145/89 HR 78. PA, Angiulli notified. New orders placed per Dan Angiulli. Pt back to bed responds to commands, denies pain.  OT working with patient.  Pt having increased amount of drooling and unable to handle secretions well. Pt currently on D3 diet not tolerating well. Pt continues to pocket food in right cheek with poor awareness. RN discussed concerns with ST. Pt changed to D1 diet with full supervision. RN will continue to monitor.

## 2015-11-21 NOTE — Plan of Care (Signed)
Problem: RH BLADDER ELIMINATION Goal: RH STG MANAGE BLADDER WITH ASSISTANCE STG Manage Bladder With max Assistance  Outcome: Not Progressing Total assist     

## 2015-11-21 NOTE — Telephone Encounter (Signed)
Pt in IP rehab

## 2015-11-21 NOTE — Discharge Instructions (Signed)
Inpatient Rehab Discharge Instructions  Dominic HoughRonald E Pineda Discharge date and time: No discharge date for patient encounter.   Activities/Precautions/ Functional Status: Activity: activity as tolerated Diet: regular diet Wound Care: none needed Functional status:  ___ No restrictions     ___ Walk up steps independently ___ 24/7 supervision/assistance   ___ Walk up steps with assistance ___ Intermittent supervision/assistance  ___ Bathe/dress independently ___ Walk with walker     _x__ Bathe/dress with assistance ___ Walk Independently    ___ Shower independently ___ Walk with assistance    ___ Shower with assistance ___ No alcohol     ___ Return to work/school ________  Special Instructions: No smoking or illicit drug use STROKE/TIA DISCHARGE INSTRUCTIONS SMOKING Cigarette smoking nearly doubles your risk of having a stroke & is the single most alterable risk factor  If you smoke or have smoked in the last 12 months, you are advised to quit smoking for your health.  Most of the excess cardiovascular risk related to smoking disappears within a year of stopping.  Ask you doctor about anti-smoking medications  [place] Quit Line: 1-800-QUIT NOW  Free Smoking Cessation Classes [phone]  CHOLESTEROL Know your levels; limit fat & cholesterol in your diet  Lipid Panel     Component Value Date/Time   CHOL 209* 11/12/2015 0541   TRIG 93 11/12/2015 0541   HDL 35* 11/12/2015 0541   CHOLHDL 6.0 11/12/2015 0541   VLDL 19 11/12/2015 0541   LDLCALC 155* 11/12/2015 0541      Many patients benefit from treatment even if their cholesterol is at goal.  Goal: Total Cholesterol (CHOL) less than 160  Goal:  Triglycerides (TRIG) less than 150  Goal:  HDL greater than 40  Goal:  LDL (LDLCALC) less than 100   BLOOD PRESSURE American Stroke Association blood pressure target is less that 120/80 mm/Hg  Your discharge blood pressure is:  BP: (!) 159/64 mmHg  Monitor your blood pressure  Limit  your salt and alcohol intake  Many individuals will require more than one medication for high blood pressure  DIABETES (A1c is a blood sugar average for last 3 months) Goal HGBA1c is under 7% (HBGA1c is blood sugar average for last 3 months)  Diabetes: No known diagnosis of diabetes    Lab Results  Component Value Date   HGBA1C 5.5 11/12/2015     Your HGBA1c can be lowered with medications, healthy diet, and exercise.  Check your blood sugar as directed by your physician  Call your physician if you experience unexplained or low blood sugars.  PHYSICAL ACTIVITY/REHABILITATION Goal is 30 minutes at least 4 days per week  Activity: Increase activity slowly, Therapies: Physical Therapy: Home Health Return to work:   Activity decreases your risk of heart attack and stroke and makes your heart stronger.  It helps control your weight and blood pressure; helps you relax and can improve your mood.  Participate in a regular exercise program.  Talk with your doctor about the best form of exercise for you (dancing, walking, swimming, cycling).  DIET/WEIGHT Goal is to maintain a healthy weight  Your discharge diet is: Diet Heart Room service appropriate?: Yes; Fluid consistency:: Thin  liquids Your height is:  Height: 5\' 10"  (177.8 cm) Your current weight is: Weight: 127.85 kg (281 lb 13.7 oz) Your Body Mass Index (BMI) is:  BMI (Calculated): 40.4  Following the type of diet specifically designed for you will help prevent another stroke.  Your goal weight range is:  Your goal Body Mass Index (BMI) is 19-24.  Healthy food habits can help reduce 3 risk factors for stroke:  High cholesterol, hypertension, and excess weight.  RESOURCES Stroke/Support Group:  Call [phone]   STROKE EDUCATION PROVIDED/REVIEWED AND GIVEN TO PATIENT Stroke warning signs and symptoms How to activate emergency medical system (call 911). Medications prescribed at discharge. Need for follow-up after  discharge. Personal risk factors for stroke. Pneumonia vaccine given:  Flu vaccine given:  My questions have been answered, the writing is legible, and I understand these instructions.  I will adhere to these goals & educational materials that have been provided to me after my discharge from the hospital.       My questions have been answered and I understand these instructions. I will adhere to these goals and the provided educational materials after my discharge from the hospital.  Patient/Caregiver Signature _______________________________ Date __________  Clinician Signature _______________________________________ Date __________  Please bring this form and your medication list with you to all your follow-up doctor's appointments.

## 2015-11-21 NOTE — Progress Notes (Signed)
Subjective/Complaints: No issues overnite, reduced appetite over weekend but pt denies abd discomfort or nausea ROS-No CP or SOB, no limb pain Objective: Vital Signs: Blood pressure 159/64, pulse 64, temperature 97.4 F (36.3 C), temperature source Oral, resp. rate 18, height 5\' 10"  (1.778 m), weight 127.85 kg (281 lb 13.7 oz), SpO2 96 %. No results found. No results found for this or any previous visit (from the past 72 hour(s)).   HEENT: normal Cardio: RRR Resp: CTA B/L and unlabored GI: BS positive and NT, ND Extremity:  No Edema Skin:   Intact Neuro: Lethargic, Normal Sensory, Abnormal Motor right Delt bi tri grip 4/5, R HF 4/5 R ADF 4/5 , left side 5/5, normal tone and Abnormal FMC Ataxic/ dec FMC Musc/Skel:  Other no pain with UE or LE ROM Gen NAD   Assessment/Plan: 1. 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 Physiatrist is providing close team supervision and 24 hour management of active medical problems listed below. Physiatrist and rehab team continue to assess barriers to discharge/monitor patient progress toward functional and medical goals. FIM: Function - Bathing Position: Wheelchair/chair at sink Body parts bathed by patient: Right arm, Left arm, Chest, Abdomen, Front perineal area, Right upper leg, Left upper leg Body parts bathed by helper: Right lower leg, Left lower leg, Back, Buttocks Assist Level: Touching or steadying assistance(Pt > 75%)  Function- Upper Body Dressing/Undressing What is the patient wearing?: Pull over shirt/dress Pull over shirt/dress - Perfomed by patient: Put head through opening, Thread/unthread left sleeve, Thread/unthread right sleeve, Pull shirt over trunk Pull over shirt/dress - Perfomed by helper: Pull shirt over trunk Assist Level: Supervision or verbal cues Function - Lower Body  Dressing/Undressing What is the patient wearing?: Pants, Non-skid slipper socks Position: Wheelchair/chair at sink Pants- Performed by patient: Thread/unthread right pants leg, Thread/unthread left pants leg, Pull pants up/down Pants- Performed by helper: Thread/unthread right pants leg, Thread/unthread left pants leg, Pull pants up/down Non-skid slipper socks- Performed by helper: Don/doff right sock, Don/doff left sock Assist for footwear: Dependant Assist for lower body dressing: Touching or steadying assistance (Pt > 75%)  Function - Toileting Toileting activity did not occur: No continent bowel/bladder event Toileting steps completed by patient: Adjust clothing prior to toileting, Performs perineal hygiene, Adjust clothing after toileting Toileting steps completed by helper: Adjust clothing prior to toileting, Performs perineal hygiene, Adjust clothing after toileting Toileting Assistive Devices: Grab bar or rail Assist level: Supervision or verbal cues  Function - ArchivistToilet Transfers Toilet transfer activity did not occur:  (no continent voids or stool)  Function - Chair/bed transfer Chair/bed transfer method: Stand pivot Chair/bed transfer assist level: Moderate assist (Pt 50 - 74%/lift or lower) Chair/bed transfer assistive device: Walker Chair/bed transfer details: Tactile cues for placement, Verbal cues for technique  Function - Locomotion: Wheelchair Will patient use wheelchair at discharge?: Yes Type: Manual Max wheelchair distance: 50 Assist Level: Touching or steadying assistance (Pt > 75%) Wheel 150 feet activity did not occur: Safety/medical concerns (DOE- propel x 50') Assist Level: Touching or steadying assistance (Pt > 75%) Turns around,maneuvers to table,bed, and toilet,negotiates 3% grade,maneuvers on rugs and over doorsills: No Function - Locomotion: Ambulation Assistive device: Walker-rolling Max distance: 120 Assist level: Touching or steadying assistance (Pt  > 75%) Assist level: Touching or steadying assistance (Pt > 75%) Walk 50 feet with 2 turns activity did not occur: Safety/medical  concerns (DOE on exertion ; gait x 30') Assist level: Touching or steadying assistance (Pt > 75%) Walk 150 feet activity did not occur: Safety/medical concerns (DOE - gait x 30') Assist level: Touching or steadying assistance (Pt > 75%) Assist level: Touching or steadying assistance (Pt > 75%) (without AD)  Function - Comprehension Comprehension: Auditory Comprehension assist level: Understands basic 75 - 89% of the time/ requires cueing 10 - 24% of the time  Function - Expression Expression: Verbal Expression assist level: Expresses basic 75 - 89% of the time/requires cueing 10 - 24% of the time. Needs helper to occlude trach/needs to repeat words.  Function - Social Interaction Social Interaction assist level: Interacts appropriately 75 - 89% of the time - Needs redirection for appropriate language or to initiate interaction.  Function - Problem Solving Problem solving assist level: Solves basic 50 - 74% of the time/requires cueing 25 - 49% of the time  Function - Memory Memory assist level: Recognizes or recalls 25 - 49% of the time/requires cueing 50 - 75% of the time Patient normally able to recall (first 3 days only): Location of own room  Medical Problem List and Plan: 1.  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  DVT Prophylaxis/Anticoagulation: Subcutaneous Lovenox. Monitor platelet counts and any signs of bleeding 3. Pain Management: Tylenol as needed 4. Mood/bipolar disorder. No present medications. Plan to discuss with sister. 5. Neuropsych: This patient is capable of making decisions on his own behalf. 6. Skin/Wound Care: Routine skin checks 7. Fluids/Electrolytes/Nutrition: Routine I&O , CMET with mild hypoalbuminemia,add prostat, 10-50% intake, add  Megace             8. Hypertension. Lisinopril 40 mg daily. Monitor with increased mobility,increased calan SR to   Filed Vitals:   11/20/15 1900 11/21/15 0500  BP: 163/67 159/64  Pulse: 77 64  Temp:  97.4 F (36.3 C)  Resp: 18 18   9. Hyperlipidemia. Lipitor 10. Remote history of cocaine abuse. Urine drug screen negative.   11.  Bowel and Bladder incont- likely due to poor awareness, cont toileting program LOS (Days) 6 A FACE TO FACE EVALUATION WAS PERFORMED  KIRSTEINS,ANDREW E 11/21/2015, 7:28 AM

## 2015-11-21 NOTE — Progress Notes (Signed)
Occupational Therapy Session Note  Patient Details  Name: Dominic Pineda MRN: [id number] Date of Birth: [birth year]  Today's Date: 11/21/2015 OT Individual Time: 1300-1430 OT Individual Time Calculation (min): 90 min    Short Term Goals: Week 1:  OT Short Term Goal 1 (Week 1): Pt will complete LB selfcare with supervision sit to stand using AE PRN. OT Short Term Goal 2 (Week 1): Pt will complete toilet transfers with supervision using LRAD. OT Short Term Goal 3 (Week 1): Pt will complete shower transfers with supervision using LRAD. OT Short Term Goal 4 (Week 1): Pt will tolerate standing for at least 3 mins during grooming tasks at the sink.   Skilled Therapeutic Interventions/Progress Updates:    Pt completed UB bathing and dressing sitting EOB.  BP taken in initial sitting at 141/71.  Pt needing max assist for initiation and transition to sitting from supine.  Noted pt with no management of his secretions at all during sitting EOB with therapist having to provide max instructional cueing for wiping drool off of his abdomen.  He needed max step by step demonstrational cueing for all UB bathing except for washing his face.  He would continually perseverate on washing his face when told to wash other parts of his UB and would need hand over hand cueing to transition to other parts.  Progressed to eating sitting EOB.  Pt unable to chew up and swallow his potato cues and roast.  Therapist had to manually go into his mouth and pull the food out.  He was able to tolerate mashed potatoes and pudding cake, however still with excessive drooling.  Max instructional cueing to slow down and only take one small bite at a time.  Had pt ambulate to the therapy apartment using the RW and min guard assist.  Mod instructional cueing for turning the walker all the way around and backing up to the surface of the couch for sitting.  At start of session during B/D speech was minimal and non-intelligible.  After  transferring to the ADL apartment speech was slightly more intelligible but still dysarthric.  Oxygen sats 93% and HR 85 with ambulation to the apartment and then back to the room.  Pt transferred to supine with min assist.  His sister Aggie Cosierheresa was present for session throughout and wants to take pt home where she can continue to provide 24 hour supervision.  Pt left with call button and phone in reach.  Bed alarm in place.  Second BP taken in sitting after completion of eating 121/78.    Therapy Documentation Precautions:  Precautions Precautions: Fall Restrictions Weight Bearing Restrictions: No  Pain: Pain Assessment Pain Assessment: No/denies pain ADL: See Function Navigator for Current Functional Status.   Therapy/Group: Individual Therapy  Debraann Livingstone OTR/L 11/21/2015, 4:19 PM

## 2015-11-21 NOTE — Progress Notes (Addendum)
Physical Therapy Session Note  Patient Details  Name: Dominic HoughRonald E Wohler MRN: [id number] Date of Birth: [birth year]  Today's Date: 11/21/2015 PT Individual Time: 1115-1200 PT Individual Time Calculation (min): 45 min   Short Term Goals: Week 1:  PT Short Term Goal 1 (Week 1): = LTGs due to ELOS  Skilled Therapeutic Interventions/Progress Updates:   Concurrent tx.  Gait to/from room with RW, min guard assist.  Gait on level tile, stairs.  Pt continues to need cues q transfer for safe placement of hands on RW.  Pt drooling this session, and very difficult to understand, generally with 1 word utterances or grunting.   neuromuscular re-education via manual and VCs, demo for quad activation during mini squats to retrieve items from bench in front; kicking ball with either LE in high unsupported sitting.  Attempted pelvic dissociation during scooting forward/backward, but pt overcompensates with UEs and does not protract pelvis.    At end of session, pt became diaphoretic, uncomfortable, and agreed to lie down.  RNs called and came to gym. See vitals.  Pt with fixed stare briefly, but quickly responsive to name and commands.  Pt transferred to w/c with min assist, without AD. RN returned pt to unti.    Therapy Documentation Precautions:  Precautions Precautions: Fall Restrictions Weight Bearing Restrictions: No Pain: Pain Assessment Pain Assessment: No/denies pain      See Function Navigator for Current Functional Status.   Therapy/Group: Individual Therapy  Raechel Marcos 11/21/2015, 12:21 PM

## 2015-11-21 NOTE — Evaluation (Signed)
Speech Language Pathology Bedside Swallow Assessment and Plan  Patient Details  Name: Dominic Pineda MRN: [id number] Date of Birth: 03/31/1951  SLP Diagnosis: Dysphagia;Cognitive Impairments;Dysarthria  Rehab Potential: Good ELOS: 10-12 days    Today's Date: 11/21/2015 SLP Individual Time: 0865-7846 SLP Individual Time Calculation (min): 60 min   Problem List:  Patient Active Problem List   Diagnosis Date Noted  . CVA (cerebral vascular accident) ([place]) 11/15/2015  . Essential hypertension   . Hemiparesis affecting left side as late effect of stroke ([place])   . Bipolar affective disorder in remission ([place])   . Benign essential HTN   . History of CVA (cerebrovascular accident)   . Acute blood loss anemia   . AKI (acute kidney injury) ([place])   . Acute cerebrovascular accident (CVA) ([place]) 11/12/2015  . Substance abuse   . Severe hypertension 11/11/2015  . Stroke ([place])   . Urinary tract infectious disease   . Facial droop 10/30/2011  . CKD (chronic kidney disease) 10/30/2011  . UTI (lower urinary tract infection) 10/30/2011  . Obstructive uropathy 09/28/2011  . ARF (acute renal failure) ([place]) 09/23/2011  . Diarrhea 09/23/2011  . Nausea & vomiting 09/23/2011  . Hyperkalemia 09/23/2011  . Polyuria 09/23/2011  . Hypertensive emergency 09/23/2011  . Bipolar 1 disorder ([place]) 09/23/2011  . Shuffling gait 09/23/2011  . Weakness generalized 09/23/2011  . H/O cocaine abuse 09/23/2011  . Hypertension   . Incontinence    Past Medical History:  Past Medical History  Diagnosis Date  . Hypertension   . Bipolar 1 disorder ([place])   . Incontinence   . Mental disorder   . Depression   . Anxiety   . Blood transfusion 1970's  . Shortness of breath     has had a recent increase in episodes of shortness of breath and hyperventilating  . Stroke [facility]) 2009-2010    "have had 3 strokes"; denies residual  . BPH (benign prostatic hyperplasia)   . H/O acute renal failure 08/2011    severe  w/hematuria  . Cocaine abuse    Past Surgical History:  Past Surgical History  Procedure Laterality Date  . Incision and drainage of wound      right knee "got piece of wire in it while mowing"  . Inguinal hernia repair      left  . Tonsillectomy      "as a child"    Assessment / Plan / Recommendation Clinical Impression  Bedside swallow evaluation completed due to concerns for changes in function from staff. Oral motor exam unremarkable for changes in strength or coordination in comparison to initial evaluation; however, pt now demonstrates significant impulsivity with intake and is unable to clear solids from the oral cavity without significant assist, see treatment note below.  Pt also demonstrates significant anterior loss of saliva as well as liquids via cup sips and straws.  No overt s/s of aspiration evident with solids, liquids, or mixed consistencies; however, given decline in oral phase since last treatment session, recommend downgrading pt's solids to dys 1, thin liquids.  Full supervision during meals also appears warranted at this time for safety concerns.   Question medication effects versus fatigue versus change in medical status.  P.A. And RN made aware.    Skilled Therapeutic Interventions          Prior to consumption of POs, pt demonstrated decreased anterior containment of saliva with copious drooling and decreased speech intelligibility at the word level which is not the patient's norm. Since  being on inpatient, pt has had intermittent mild anterior loss of saliva while being challenged during physical and cognitive tasks.  Also, though not formally evaluated, pt has been seen for treatment during and immediately following meals and has exhibited slowed but grossly WFL oropharyngeal swallowing function.  Today, during consumption of his breakfast tray, pt required total digital assist to clear large amounts of solids from the oral cavity due to perseverative and ineffective  munching pattern of mastication with limited posterior movement of bolus. Pt with poor awareness of difficulty and continued to impulsively feed himself large boluses of solids at an increased rate.  Recommend downgrade at this time and full supervision during meals.  Will initiate goals for dysarthria and dysphagia and adjust pt's plan of care to 3-5x/week.    SLP Assessment  Patient will need skilled Speech Lanaguage Pathology Services during CIR admission    Recommendations  SLP Diet Recommendations: Dysphagia 1 (Purees) Liquid Administration via: Straw;Cup Medication Administration: Whole meds with liquid Supervision: Patient able to self feed;Full supervision/cueing for compensatory strategies Compensations: Minimize environmental distractions;Slow rate;Small sips/bites;Lingual sweep for clearance of pocketing;Monitor for anterior loss;Follow solids with liquid Postural Changes and/or Swallow Maneuvers: Out of bed for meals;Seated upright 90 degrees Patient destination: Home Follow up Recommendations: Other (comment) (to be determined pending progress made while inpatient ) Equipment Recommended: None recommended by SLP    SLP Frequency 3 to 5 out of 7 days   SLP Duration  SLP Intensity  SLP Treatment/Interventions 10-12 days   Minumum of 1-2 x/day, 30 to 90 minutes  Cognitive remediation/compensation;Cueing hierarchy;Functional tasks;Internal/external aids;Patient/family education;Environmental controls;Dysphagia/aspiration precaution training    Pain Pain Assessment Pain Assessment: No/denies pain  Prior Functioning    Function:  Eating Eating   Modified Consistency Diet: Yes Eating Assist Level: Helper checks for pocketed food;Set up assist for;Supervision or verbal cues   Eating Set Up Assist For: Opening containers       Cognition Comprehension Comprehension assist level: Understands basic 25 - 49% of the time/ requires cueing 50 - 75% of the time  Expression    Expression assist level: Expresses basic 25 - 49% of the time/requires cueing 50 - 75% of the time. Uses single words/gestures.  Social Interaction Social Interaction assist level: Interacts appropriately 25 - 49% of time - Needs frequent redirection.  Problem Solving Problem solving assist level: Solves basic less than 25% of the time - needs direction nearly all the time or does not effectively solve problems and may need a restraint for safety  Memory Memory assist level: Recognizes or recalls 25 - 49% of the time/requires cueing 50 - 75% of the time   Short Term Goals: Week 1: SLP Short Term Goal 1 (Week 1): STG=LTG due to ELOS for SLP   Refer to Care Plan for Long Term Goals  Recommendations for other services: None  Discharge Criteria: Patient will be discharged from SLP if patient refuses treatment 3 consecutive times without medical reason, if treatment goals not met, if there is a change in medical status, if patient makes no progress towards goals or if patient is discharged from hospital.  The above assessment, treatment plan, treatment alternatives and goals were discussed and mutually agreed upon: No family available/patient unable  Emilio Math 11/21/2015, 4:09 PM

## 2015-11-22 ENCOUNTER — Inpatient Hospital Stay (HOSPITAL_COMMUNITY): Payer: No Typology Code available for payment source | Admitting: Physical Therapy

## 2015-11-22 ENCOUNTER — Inpatient Hospital Stay (HOSPITAL_COMMUNITY): Payer: Commercial Managed Care - HMO | Admitting: Occupational Therapy

## 2015-11-22 ENCOUNTER — Inpatient Hospital Stay (HOSPITAL_COMMUNITY): Payer: Commercial Managed Care - HMO | Admitting: Speech Pathology

## 2015-11-22 LAB — CREATININE, SERUM: Creatinine, Ser: 1.18 mg/dL (ref 0.61–1.24)

## 2015-11-22 MED ORDER — MEGESTROL ACETATE 400 MG/10ML PO SUSP
400.0000 mg | Freq: Two times a day (BID) | ORAL | Status: DC
  Administered 2015-11-22 – 2015-11-24 (×5): 400 mg via ORAL
  Filled 2015-11-22 (×6): qty 10

## 2015-11-22 NOTE — Progress Notes (Signed)
Occupational Therapy Session Note  Patient Details  Name: Dominic HoughRonald E Kegg MRN: [id number] Date of Birth: [birth year]  Today's Date: 11/22/2015 OT Individual Time: 1500-1529 OT Individual Time Calculation (min): 29 min    Skilled Therapeutic Interventions/Progress Updates:    Pt practiced tub shower transfers using tub bench like he has at home.  His sister Aggie Cosierheresa was present and able to observe therapy session as well.  Max instructional cueing to initiate sit to stand from bedside chair, with close supervision to ambulate to the ADL apartment with use of the RW.  Had pt sit on therapy bed to rest before ambulating to the tub/shower room as that tub is facing the same direction as the one at his house.  He was able to complete tub shower transfer with mod instructional cueing and supervision.  He continues to sit down before squaring his body up with the surface of the seat or chair, and when using the RW he tends to not bring it all the way back to his seat.  Pt attempted to stand and urinate before walking back to his room with close supervision and no assistive device.  Pt transferred to recliner with call button and safety belt in place.  Pt's sister present as well.  Discussed possible discharge end of this week if the rest of the team members and MD agree.    Therapy Documentation Precautions:  Precautions Precautions: Fall Restrictions Weight Bearing Restrictions: No  Pain:  No report of pain noted during session  ADL: See Function Navigator for Current Functional Status.   Therapy/Group: Individual Therapy  Pansie Guggisberg OTR/L 11/22/2015, 3:54 PM

## 2015-11-22 NOTE — Plan of Care (Signed)
Problem: RH Dressing Goal: LTG Patient will perform lower body dressing w/assist (OT) LTG: Patient will perform lower body dressing with assist, with/without cues in positioning using equipment (OT)  Goals downgraded based on pt's PLOF and expected need for assistance with LB dressing at discharge.

## 2015-11-22 NOTE — Progress Notes (Signed)
Awake most of night, without specific complaint of.  No drooling noted. Picking at scab to top of head, small amount of blood noted on pillow. Pulled IV out around 0030. Ate 1 container of applesauce. Dominic MartinezMurray, Jerica Creegan A

## 2015-11-22 NOTE — Progress Notes (Signed)
Occupational Therapy Session Note  Patient Details  Name: Dominic HoughRonald E Koppenhaver MRN: [id number] Date of Birth: 24-Feb-1951  Today's Date: 11/22/2015 OT Individual Time: 1001-1100 OT Individual Time Calculation (min): 59 min    Short Term Goals: Week 1:  OT Short Term Goal 1 (Week 1): Pt will complete LB selfcare with supervision sit to stand using AE PRN. OT Short Term Goal 2 (Week 1): Pt will complete toilet transfers with supervision using LRAD. OT Short Term Goal 3 (Week 1): Pt will complete shower transfers with supervision using LRAD. OT Short Term Goal 4 (Week 1): Pt will tolerate standing for at least 3 mins during grooming tasks at the sink.   Skilled Therapeutic Interventions/Progress Updates:    Pt completed bathing and dressing sit to stand shower level during session.  Pt needed assistance for removing gripper socks with max instructional cueing and min guard to remove other clothing.  Pt needed step by step cueing to complete bathing thoroughly including mod assist from therapist for thoroughly rinsing off his back and underarms.  Min guard assist for all dressing except for donning slip on shoes.  Educated pt on use of the shoe funnel for donning slip on shoes, but still needs max demonstrational cueing to incorporate.  Continuous drooling still noted with dysarthric speech.  Pt aware of place and situation when asked.  Left pt in bedside chair with call button and phone in place as well as safety belt.    Therapy Documentation Precautions:  Precautions Precautions: Fall Restrictions Weight Bearing Restrictions: No  Pain: Pain Assessment Pain Assessment: No/denies pain ADL: See Function Navigator for Current Functional Status.   Therapy/Group: Individual Therapy  Meryle Pugmire OTR/L 11/22/2015, 12:21 PM

## 2015-11-22 NOTE — Progress Notes (Signed)
Subjective/Complaints: RN notes that pt not responding to timed toileting, suspects premorbid chronic continence issues ROS-No CP or SOB, no limb pain Objective: Vital Signs: Blood pressure 153/73, pulse 71, temperature 98.6 F (37 C), temperature source Oral, resp. rate 19, height '5\' 10"'$  (1.778 m), weight 127.85 kg (281 lb 13.7 oz), SpO2 93 %. Dg Chest Port 1 View  11/21/2015  CLINICAL DATA:  Shortness of breath with hypertension during therapy today. Initial encounter. EXAM: PORTABLE CHEST 1 VIEW COMPARISON:  03/14/2013. FINDINGS: 1241 hours. Chronic low lung volumes with asymmetric right hemidiaphragm elevation and chronic right basilar atelectasis. There is no edema, confluent airspace opacity or significant pleural effusion. The heart size and mediastinal contours are stable. The bones appear unchanged. IMPRESSION: Stable chest with chronic right hemidiaphragm elevation and basilar atelectasis. No acute findings. Electronically Signed   By: Richardean Sale M.D.   On: 11/21/2015 12:57   Results for orders placed or performed during the hospital encounter of 11/15/15 (from the past 72 hour(s))  Glucose, capillary     Status: Abnormal   Collection Time: 11/21/15 12:07 PM  Result Value Ref Range   Glucose-Capillary 122 (H) 65 - 99 mg/dL  CBC     Status: Abnormal   Collection Time: 11/21/15 12:15 PM  Result Value Ref Range   WBC 12.0 (H) 4.0 - 10.5 K/uL   RBC 4.94 4.22 - 5.81 MIL/uL   Hemoglobin 13.9 13.0 - 17.0 g/dL   HCT 42.7 39.0 - 52.0 %   MCV 86.4 78.0 - 100.0 fL   MCH 28.1 26.0 - 34.0 pg   MCHC 32.6 30.0 - 36.0 g/dL   RDW 14.1 11.5 - 15.5 %   Platelets 218 150 - 400 K/uL  Basic metabolic panel     Status: Abnormal   Collection Time: 11/21/15 12:15 PM  Result Value Ref Range   Sodium 141 135 - 145 mmol/L   Potassium 4.0 3.5 - 5.1 mmol/L   Chloride 106 101 - 111 mmol/L   CO2 25 22 - 32 mmol/L   Glucose, Bld 128 (H) 65 - 99 mg/dL   BUN 18 6 - 20 mg/dL   Creatinine, Ser 1.37  (H) 0.61 - 1.24 mg/dL   Calcium 9.6 8.9 - 10.3 mg/dL   GFR calc non Af Amer 52 (L) >60 mL/min   GFR calc Af Amer >60 >60 mL/min    Comment: (NOTE) The eGFR has been calculated using the CKD EPI equation. This calculation has not been validated in all clinical situations. eGFR's persistently <60 mL/min signify possible Chronic Kidney Disease.    Anion gap 10 5 - 15  Creatinine, serum     Status: None   Collection Time: 11/22/15  5:00 AM  Result Value Ref Range   Creatinine, Ser 1.18 0.61 - 1.24 mg/dL   GFR calc non Af Amer >60 >60 mL/min   GFR calc Af Amer >60 >60 mL/min    Comment: (NOTE) The eGFR has been calculated using the CKD EPI equation. This calculation has not been validated in all clinical situations. eGFR's persistently <60 mL/min signify possible Chronic Kidney Disease.      HEENT: normal Cardio: RRR Resp: CTA B/L and unlabored GI: BS positive and NT, ND Extremity:  No Edema Skin:   Intact Neuro: Lethargic, Normal Sensory, Abnormal Motor right Delt bi tri grip 4/5, R HF 4/5 R ADF 4/5 , left side 5/5, normal tone and Abnormal FMC Ataxic/ dec FMC Musc/Skel:  Other no pain with UE or  LE ROM Gen NAD   Assessment/Plan: 1. 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 Physiatrist is providing close team supervision and 24 hour management of active medical problems listed below. Physiatrist and rehab team continue to assess barriers to discharge/monitor patient progress toward functional and medical goals. FIM: Function - Bathing Position: Sitting EOB Body parts bathed by patient: Right arm, Left arm, Chest, Abdomen Body parts bathed by helper: Left lower leg, Right lower leg Bathing not applicable: Left upper leg, Right upper leg, Buttocks, Front perineal area (washed earlier by nursing) Assist Level: Touching or steadying  assistance(Pt > 75%)  Function- Upper Body Dressing/Undressing What is the patient wearing?: Pull over shirt/dress Pull over shirt/dress - Perfomed by patient: Thread/unthread right sleeve, Put head through opening, Pull shirt over trunk Pull over shirt/dress - Perfomed by helper: Thread/unthread left sleeve Assist Level: Supervision or verbal cues Function - Lower Body Dressing/Undressing What is the patient wearing?: Pants, Non-skid slipper socks Position: Wheelchair/chair at sink Pants- Performed by patient: Thread/unthread right pants leg, Thread/unthread left pants leg, Pull pants up/down Pants- Performed by helper: Thread/unthread right pants leg, Thread/unthread left pants leg, Pull pants up/down Non-skid slipper socks- Performed by helper: Don/doff right sock, Don/doff left sock Assist for footwear: Dependant Assist for lower body dressing: Touching or steadying assistance (Pt > 75%)  Function - Toileting Toileting activity did not occur: No continent bowel/bladder event Toileting steps completed by patient: Adjust clothing prior to toileting, Performs perineal hygiene, Adjust clothing after toileting Toileting steps completed by helper: Adjust clothing prior to toileting, Performs perineal hygiene, Adjust clothing after toileting Toileting Assistive Devices: Grab bar or rail Assist level: Supervision or verbal cues  Function - Air cabin crew transfer activity did not occur:  (no continent voids or stool)  Function - Chair/bed transfer Chair/bed transfer method: Stand pivot Chair/bed transfer assist level: Touching or steadying assistance (Pt > 75%) Chair/bed transfer assistive device: Walker Chair/bed transfer details: Tactile cues for initiation, Manual facilitation for weight shifting  Function - Locomotion: Wheelchair Will patient use wheelchair at discharge?: Yes Type: Manual Max wheelchair distance: 50 Assist Level: Touching or steadying assistance (Pt >  75%) Wheel 150 feet activity did not occur: Safety/medical concerns (DOE- propel x 50') Assist Level: Touching or steadying assistance (Pt > 75%) Turns around,maneuvers to table,bed, and toilet,negotiates 3% grade,maneuvers on rugs and over doorsills: No Function - Locomotion: Ambulation Assistive device: Walker-rolling Max distance: 150 Assist level: Touching or steadying assistance (Pt > 75%) Assist level: Touching or steadying assistance (Pt > 75%) Walk 50 feet with 2 turns activity did not occur: Safety/medical concerns (DOE on exertion ; gait x 30') Assist level: Touching or steadying assistance (Pt > 75%) Walk 150 feet activity did not occur: Safety/medical concerns (DOE - gait x 30') Assist level: Touching or steadying assistance (Pt > 75%) Assist level: Touching or steadying assistance (Pt > 75%) (without AD)  Function - Comprehension Comprehension: Auditory Comprehension assist level: Understands basic 75 - 89% of the time/ requires cueing 10 - 24% of the time  Function - Expression Expression: Verbal Expression assist level: Expresses basic 75 - 89% of the time/requires cueing 10 - 24% of the time. Needs helper to occlude trach/needs to repeat words.  Function - Social Interaction Social Interaction assist level: Interacts appropriately 75 - 89% of the time - Needs redirection for appropriate language or to initiate interaction.  Function - Problem Solving Problem solving assist level: Solves basic 50 - 74% of the time/requires cueing 25 - 49% of the time  Function - Memory Memory assist level: Recognizes or recalls 25 - 49% of the time/requires cueing 50 - 75% of the time Patient normally able to recall (first 3 days only): Location of own room  Medical Problem List and Plan: 1.  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  DVT Prophylaxis/Anticoagulation: Subcutaneous Lovenox.  Monitor platelet counts and any signs of bleeding 3. Pain Management: Tylenol as needed 4. Mood/bipolar disorder. No present medications.also suspect baseline dementia will get Neuropsych eval 5. Neuropsych: This patient is capable of making decisions on his own behalf. 6. Skin/Wound Care: Routine skin checks 7. Fluids/Electrolytes/Nutrition: Routine I&O , CMET with mild hypoalbuminemia,add prostat, 25-45%% intake, Megace '400mg'$  BID            8. Hypertension. Lisinopril 40 mg daily. Monitor with increased mobility,increased calan SR to '360mg'$   Filed Vitals:   11/21/15 1959 11/22/15 0505  BP: 153/73   Pulse: 71   Temp:  98.6 F (37 C)  Resp:  19   9. Hyperlipidemia. Lipitor 10. Remote history of cocaine abuse. Urine drug screen negative.   11.  Bowel and Bladder incont- likely due to poor awareness, cont toileting program 12.  Leukocytosis- trending down, now 12K d/c emipric cipro LOS (Days) 7 A FACE TO FACE EVALUATION WAS PERFORMED  KIRSTEINS,ANDREW E 11/22/2015, 7:06 AM

## 2015-11-22 NOTE — Progress Notes (Signed)
Physical Therapy Session Note  Patient Details  Name: Dominic Pineda MRN: [id number] Date of Birth: [birth year]  Today's Date: 11/22/2015 PT Individual Time: 1108-1201 PT Individual Time Calculation (min): 53 min   Short Term Goals: Week 1:  PT Short Term Goal 1 (Week 1): = LTGs due to ELOS  Skilled Therapeutic Interventions/Progress Updates:    Pt received resting in recliner and agreeable to therapy session, no c/o pain.  Gait training to/from therapy gym with close supervision and verbal cues for safe positioning of RW when approaching therapy mat/recliner to sit down.  Transfers throughout session with close supervision and repeated verbal cues for hand placement for pushing up rather than pulling to stand.  Attempted reciprocal scooting on therapy mat but pt unable to follow verbal or visual cues for this.  L side lying and reaching to R for improved oblique activation on R.  Kinetron x5 minutes at 20 cm/s in sitting for reciprocal stepping pattern and 2x10 seconds in standing for pelvic NMR/stepping pattern.  Pt continually sitting down once standing in kinetron and reports that he is fatigued.  Transfer to therapy mat with supervision.  Pt seated on dynadisk on edge of mat for increased challenge to trunk musculature during dynamic sitting activity reaching for and tapping a ball.  Pt further c/o fatigue and requesting to return to room.  Pt amb to room in same manner as above and positioned in recliner with call bell in reach and needs met.    Therapy Documentation Precautions:  Precautions Precautions: Fall Restrictions Weight Bearing Restrictions: No General: PT Amount of Missed Time (min): 7 Minutes PT Missed Treatment Reason: Patient fatigue   See Function Navigator for Current Functional Status.   Therapy/Group: Individual Therapy  Earnest Conroy Penven-Crew 11/22/2015, 12:08 PM

## 2015-11-22 NOTE — Progress Notes (Signed)
Speech Language Pathology Daily Session Note  Patient Details  Name: Dominic HoughRonald E Marvin MRN: [id number] Date of Birth: 08-Dec-1950  Today's Date: 11/22/2015 SLP Individual Time: 0805-0900 SLP Individual Time Calculation (min): 55 min  Short Term Goals: Week 1: SLP Short Term Goal 1 (Week 1): STG=LTG due to ELOS for SLP   Skilled Therapeutic Interventions:  Pt was seen for skilled ST targeting goals for dysphagia and cognition.  SLP facilitated the session with skilled observations during presentations of pt's currently prescribed diet (Dys 1, thin liquids).  Pt required mod-max assist verbal cues for rate and portion control to minimize build up of oral residue.  Pt able to clear purees from the oral cavity with mod verbal cues for use of liquid wash as well as rate and portion control.  Continue to note poor awareness of and attention to bolus.  Min verbal and visual cues needed to monitor and correct anterior labial loss of materials.  No overt s/s of aspiration evident with purees or liquids.  Recommend that pt remain on current diet to minimize risk of aspiration.  SLP also facilitated the session with a basic card game targeting sustained attention to task.  Pt required consistent mod assist verbal cues for task initiation and sequencing.  Pt able to sustain his attention to task for ~1 minute intervals before requiring min assist verbal cues for redirection.  Pt was left in recliner with call bell within reach and quick release belt donned for safety.  Continue per current plan of care.     Function:  Eating Eating   Modified Consistency Diet: Yes Eating Assist Level: Helper checks for pocketed food           Cognition Comprehension Comprehension assist level: Understands basic 50 - 74% of the time/ requires cueing 25 - 49% of the time  Expression   Expression assist level: Expresses basic 50 - 74% of the time/requires cueing 25 - 49% of the time. Needs to repeat parts of sentences.   Social Interaction Social Interaction assist level: Interacts appropriately 50 - 74% of the time - May be physically or verbally inappropriate.  Problem Solving Problem solving assist level: Solves basic 25 - 49% of the time - needs direction more than half the time to initiate, plan or complete simple activities  Memory Memory assist level: Recognizes or recalls 25 - 49% of the time/requires cueing 50 - 75% of the time    Pain Pain Assessment Pain Assessment: No/denies pain  Therapy/Group: Individual Therapy  Avagrace Botelho, Melanee SpryNicole L 11/22/2015, 3:57 PM

## 2015-11-23 ENCOUNTER — Inpatient Hospital Stay (HOSPITAL_COMMUNITY): Payer: Commercial Managed Care - HMO | Admitting: Occupational Therapy

## 2015-11-23 ENCOUNTER — Inpatient Hospital Stay (HOSPITAL_COMMUNITY): Payer: Commercial Managed Care - HMO

## 2015-11-23 ENCOUNTER — Inpatient Hospital Stay (HOSPITAL_COMMUNITY): Payer: Commercial Managed Care - HMO | Admitting: Speech Pathology

## 2015-11-23 NOTE — Progress Notes (Signed)
Occupational Therapy Session Note  Patient Details  Name: Dominic HoughRonald E Guillette MRN: [id number] Date of Birth: [birth year]  Today's Date: 11/23/2015 OT Individual Time: [id number] OT Individual Time Calculation (min): 74 min    Short Term Goals: Week 1:  OT Short Term Goal 1 (Week 1): Pt will complete LB selfcare with supervision sit to stand using AE PRN. OT Short Term Goal 2 (Week 1): Pt will complete toilet transfers with supervision using LRAD. OT Short Term Goal 3 (Week 1): Pt will complete shower transfers with supervision using LRAD. OT Short Term Goal 4 (Week 1): Pt will tolerate standing for at least 3 mins during grooming tasks at the sink.   Skilled Therapeutic Interventions/Progress Updates:    Pt completed bathing and dressing sit to stand from the wheelchair at the sink.  Max instructional cueing for sequencing through bathing secondary to decreased sequencing and thoroughness.  Pt unable to donn brief over the RLE needed min assist.  He was able to donn it the rest of the way and for pants the rest of the way with supervision.  Pt's daughter came in at end of session.  Discussed current levels and progress with OT at this time.  Discussed information regarding PLOF given from his sister and the daughter is not sure the sister is giving accurate information.  Deferred situation to SW.  Pt transferred from wheelchair to bedside recliner with min guard assist.  Safety belt in place with call button in reach at end of session.      Therapy Documentation Precautions:  Precautions Precautions: Fall Restrictions Weight Bearing Restrictions: No  Pain: Pain Assessment Pain Assessment: No/denies pain ADL: See Function Navigator for Current Functional Status.   Therapy/Group: Individual Therapy  Seynabou Fults OTR/L 11/23/2015, 10:48 AM

## 2015-11-23 NOTE — Progress Notes (Signed)
Speech Language Pathology Weekly Progress and Session Note  Patient Details  Name: Dominic Pineda MRN: [id number] Date of Birth: 07/25/1951  Beginning of progress report period:  November 16, 2014   End of progress report period: November 23, 2015    Today's Date: 11/23/2015 SLP Individual Time: 1301-1350 SLP Individual Time Calculation (min): 49 min  Short Term Goals: Week 1: SLP Short Term Goal 1 (Week 1): STG=LTG due to ELOS for SLP     New Short Term Goals: Week 2: SLP Short Term Goal 1 (Week 2): Pt will consume dys 1 textures and thin liquids with mod assist verbal cues for use of swallowing precautions to clear residue from the oral cavity.   SLP Short Term Goal 2 (Week 2): Pt will be intelligible at the word level with mod assist verbal cues for precise production of consonants  SLP Short Term Goal 3 (Week 2): Pt will sustain his attention to tasks for 1-2 minute intervals with min assist verbal cues for redirection.   Weekly Progress Updates:  Pt has demonstrated a significant functional decline this reporting period.  Pt's diet has been downgraded to dys 1 textures and thin liquids due to new onset orally and cognitively based dysphagia.  Pt also presents with new onset dysarthria and is unintelligible at the word level.  Pt has also demonstrated a significant decrease in overall functional communication and primarily communicates in syllabic, grunting utterances.  Work up unremarkable for acute medical changes.  Continue to question medication effects.  Pt would continue to benefit from skilled ST while inpatient in order to maximize functional independence and reduce burden of care prior to discharge.  Pt and family education is ongoing.  Recommend 24/7 supervision at discharge in addition to ST follow up at next level of care to address new onset dysphagia and dysarthria.     Intensity: Minumum of 1-2 x/day, 30 to 90 minutes Frequency: 3 to 5 out of 7 days Duration/Length of Stay:  10-12 days  Treatment/Interventions: Cognitive remediation/compensation;Cueing hierarchy;Functional tasks;Internal/external aids;Patient/family education;Environmental controls;Dysphagia/aspiration precaution training   Daily Session  Skilled Therapeutic Interventions: Pt was seen for skilled ST targeting goals for dysphagia and communication.  Pt's sister present throughout session.  Pt consumed limited presentations of purees and thin liquids with max assist multimodal cues for initiation and sequencing of self feeding.  Copious anterior spillage of saliva and materials noted to pour out of pt's mouth both with and without PO intake.  No overt s/s of aspiration evident with purees or liquids although presentations were minimal due to pt's level of participation.  Pt also almost noncommunicative during today's therapy session and primarily responded to therapist in syllabic grunts with no attempts to correct or improve intelligibily.   Pt unable to follow 1 step commands.  Pt's sister presented with questions regarding pt's decline in function.   SLP provided skilled education regarding rationale for diet downgrade and provided pt's sister with a handout of recommended textures.  All questions were answered to pt's sister's satisfaction at this time.  Pt was returned to bed with max assist verbal cues for initiation of transfer.  Pt left in bed with bed alarm set and sister at bedside.  RN made aware of pt's current level of function.       Function:   Eating Eating   Modified Consistency Diet: Yes Eating Assist Level: Helper checks for pocketed food;Helper scoops food on utensil;Help with picking up utensils;Set up assist for   Eating Set  Up Assist For: Radiographer, therapeutic on Utensil: Occasionally     Cognition Comprehension Comprehension assist level: Understands basic 25 - 49% of the time/ requires cueing 50 - 75% of the time  Expression   Expression assist level:  Expresses basis less than 25% of the time/requires cueing >75% of the time.  Social Interaction Social Interaction assist level: Interacts appropriately 25 - 49% of time - Needs frequent redirection.  Problem Solving Problem solving assist level: Solves basic less than 25% of the time - needs direction nearly all the time or does not effectively solve problems and may need a restraint for safety  Memory Memory assist level: Recognizes or recalls less than 25% of the time/requires cueing greater than 75% of the time   General    Pain Pain Assessment Pain Assessment: No/denies pain  Therapy/Group: Individual Therapy  Dominic Pineda, Melanee Spry 11/23/2015, 4:20 PM

## 2015-11-23 NOTE — Plan of Care (Signed)
Problem: RH Memory Goal: LTG Patient will use memory compensatory aids to (SLP) LTG: Patient will use memory compensatory aids to recall biographical/new, daily complex information with cues (SLP)  Outcome: Not Applicable Date Met:  89/84/21 Discontinued, no longer appropriate due to pt's change in function

## 2015-11-23 NOTE — Progress Notes (Signed)
Physical Therapy Session Note  Patient Details  Name: Dominic Pineda MRN: [id number] Date of Birth: [birth year]  Today's Date: 11/23/2015 PT Individual Time: 1422-1445 PT Individual Time Calculation (min): 23 min   Short Term Goals: Week 1:  PT Short Term Goal 1 (Week 1): = LTGs due to ELOS     Skilled Therapeutic Interventions/Progress Updates:  NT taking vitals: hypotensive.  PT spoke with RN, who took BP manually: 100/40, HR 65, in supine.  PT encouraged pt to sit up EOB, which he did eventually.  Pt c/o dizziness when questioned.  With max cues and encouragement, pt eventually stood with +2 in order to be weighed in standing.  Pt c/o dizziness. Pt sat suddenly on bed, and was assisted back into supine. +2 assist in Trendelenburg bed position [facility]  Pt placed his UEs on rails, but did not use LEs at all. Pt unwilling to continue tx, bedside. IV nurse arrived to start fluids.  Bed alarm set.    Therapy Documentation Precautions:  Precautions Precautions: Fall Restrictions Weight Bearing Restrictions: No General: PT Amount of Missed Time (min): 52 Minutes PT Missed Treatment Reason: Patient ill (Comment);Patient unwilling to participate (hypotension) Vital Signs: Therapy Vitals BP: (!) 100/40 mmHg [name] Patient Position (if appropriate): Lying Oxygen Therapy SpO2: 98 % O2 Device: Not Delivered Pain: none reported      See Function Navigator for Current Functional Status.   Therapy/Group: Individual Therapy  Mahmoud Blazejewski 11/23/2015, 2:51 PM

## 2015-11-23 NOTE — Progress Notes (Signed)
Subjective/Complaints: Spoke with niece who is Therapist, sports. Confirmed that pt stayed in bed a lot at home, she said she wasn't sure if he was confused but was incont of bowel and bladder ROS-No CP or SOB, no limb pain Objective: Vital Signs: Blood pressure 130/62, pulse 62, temperature 98.1 F (36.7 C), temperature source Oral, resp. rate 18, height _0  (1.778 m), weight 127.85 kg (281 lb 13.7 oz), SpO2 96 %. Dg Chest Port 1 View  11/21/2015  CLINICAL DATA:  Shortness of breath with hypertension during therapy today. Initial encounter. EXAM: PORTABLE CHEST 1 VIEW COMPARISON:  03/14/2013. FINDINGS: 1241 hours. Chronic low lung volumes with asymmetric right hemidiaphragm elevation and chronic right basilar atelectasis. There is no edema, confluent airspace opacity or significant pleural effusion. The heart size and mediastinal contours are stable. The bones appear unchanged. IMPRESSION: Stable chest with chronic right hemidiaphragm elevation and basilar atelectasis. No acute findings. Electronically Signed   By: Richardean Sale M.D.   On: 11/21/2015 12:57   Results for orders placed or performed during the hospital encounter of 11/15/15 (from the past 72 hour(s))  Glucose, capillary     Status: Abnormal   Collection Time: 11/21/15 12:07 PM  Result Value Ref Range   Glucose-Capillary 122 (H) 65 - 99 mg/dL  CBC     Status: Abnormal   Collection Time: 11/21/15 12:15 PM  Result Value Ref Range   WBC 12.0 (H) 4.0 - 10.5 K/uL   RBC 4.94 4.22 - 5.81 MIL/uL   Hemoglobin 13.9 13.0 - 17.0 g/dL   HCT 42.7 39.0 - 52.0 %   MCV 86.4 78.0 - 100.0 fL   MCH 28.1 26.0 - 34.0 pg   MCHC 32.6 30.0 - 36.0 g/dL   RDW 14.1 11.5 - 15.5 %   Platelets 218 150 - 400 K/uL  Basic metabolic panel     Status: Abnormal   Collection Time: 11/21/15 12:15 PM  Result Value Ref Range   Sodium 141 135 - 145 mmol/L   Potassium 4.0 3.5 - 5.1 mmol/L   Chloride 106 101 - 111 mmol/L   CO2 25 22 - 32 mmol/L   Glucose, Bld 128 (H)  65 - 99 mg/dL   BUN 18 6 - 20 mg/dL   Creatinine, Ser 1.37 (H) 0.61 - 1.24 mg/dL   Calcium 9.6 8.9 - 10.3 mg/dL   GFR calc non Af Amer 52 (L) >60 mL/min   GFR calc Af Amer >60 >60 mL/min    Comment: (NOTE) The eGFR has been calculated using the CKD EPI equation. This calculation has not been validated in all clinical situations. eGFR's persistently <60 mL/min signify possible Chronic Kidney Disease.    Anion gap 10 5 - 15  Creatinine, serum     Status: None   Collection Time: 11/22/15  5:00 AM  Result Value Ref Range   Creatinine, Ser 1.18 0.61 - 1.24 mg/dL   GFR calc non Af Amer >60 >60 mL/min   GFR calc Af Amer >60 >60 mL/min    Comment: (NOTE) The eGFR has been calculated using the CKD EPI equation. This calculation has not been validated in all clinical situations. eGFR's persistently <60 mL/min signify possible Chronic Kidney Disease.      HEENT: normal Cardio: RRR Resp: CTA B/L and unlabored GI: BS positive and NT, ND Extremity:  No Edema Skin:   Intact Neuro: Lethargic, Normal Sensory, Abnormal Motor right Delt bi tri grip 4/5, R HF 4/5 R ADF 4/5 , left  side 5/5, normal tone and Abnormal FMC Ataxic/ dec FMC,dysarthric very poor speech intelligibility Musc/Skel:  Other no pain with UE or LE ROM Gen NAD   Assessment/Plan: 1. 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 Physiatrist is providing close team supervision and 24 hour management of active medical problems listed below. Physiatrist and rehab team continue to assess barriers to discharge/monitor patient progress toward functional and medical goals. FIM: Function - Bathing Position: Shower Body parts bathed by patient: Right arm, Left arm, Chest, Abdomen, Front perineal area, Right upper leg, Left upper leg Body parts bathed by helper: Back, Left lower leg, Right lower  leg, Buttocks Bathing not applicable: Left upper leg, Right upper leg, Buttocks, Front perineal area (washed earlier by nursing) Assist Level: Touching or steadying assistance(Pt > 75%)  Function- Upper Body Dressing/Undressing What is the patient wearing?: Pull over shirt/dress Pull over shirt/dress - Perfomed by patient: Thread/unthread right sleeve, Put head through opening, Pull shirt over trunk, Thread/unthread left sleeve Pull over shirt/dress - Perfomed by helper: Thread/unthread left sleeve Assist Level: Supervision or verbal cues Function - Lower Body Dressing/Undressing What is the patient wearing?: Pants, Shoes, Underwear Position: Wheelchair/chair at sink Underwear - Performed by patient: Thread/unthread right underwear leg, Thread/unthread left underwear leg, Pull underwear up/down Pants- Performed by patient: Thread/unthread right pants leg, Thread/unthread left pants leg, Pull pants up/down Pants- Performed by helper: Thread/unthread right pants leg, Thread/unthread left pants leg, Pull pants up/down Non-skid slipper socks- Performed by helper: Don/doff right sock, Don/doff left sock Shoes - Performed by helper: Don/doff right shoe, Don/doff left shoe Assist for footwear: Dependant Assist for lower body dressing: Touching or steadying assistance (Pt > 75%)  Function - Toileting Toileting activity did not occur: No continent bowel/bladder event Toileting steps completed by patient: Adjust clothing prior to toileting, Performs perineal hygiene, Adjust clothing after toileting Toileting steps completed by helper: Adjust clothing prior to toileting, Performs perineal hygiene, Adjust clothing after toileting Toileting Assistive Devices: Grab bar or rail Assist level: Supervision or verbal cues  Function - Air cabin crew transfer activity did not occur:  (no continent voids or stool)  Function - Chair/bed transfer Chair/bed transfer method: Ambulatory Chair/bed  transfer assist level: Supervision or verbal cues Chair/bed transfer assistive device: Armrests, Walker Chair/bed transfer details: Tactile cues for initiation, Manual facilitation for weight shifting, Verbal cues for safe use of DME/AE  Function - Locomotion: Wheelchair Will patient use wheelchair at discharge?: Yes Type: Manual Max wheelchair distance: 50 Assist Level: Touching or steadying assistance (Pt > 75%) Wheel 150 feet activity did not occur: Safety/medical concerns (DOE- propel x 50') Assist Level: Touching or steadying assistance (Pt > 75%) Turns around,maneuvers to table,bed, and toilet,negotiates 3% grade,maneuvers on rugs and over doorsills: No Function - Locomotion: Ambulation Assistive device: Walker-rolling Max distance: 150 Assist level: Supervision or verbal cues Assist level: Supervision or verbal cues Walk 50 feet with 2 turns activity did not occur: Safety/medical concerns (DOE on exertion ; gait x 30') Assist level: Supervision or verbal cues Walk 150 feet activity did not occur: Safety/medical concerns (DOE - gait x 30') Assist level: Supervision or verbal cues Assist level: Touching or steadying assistance (Pt > 75%) (without AD)  Function - Comprehension Comprehension: Auditory Comprehension assist level: Understands basic 50 - 74% of the time/ requires cueing 25 - 49% of the time  Function - Expression Expression: Verbal Expression  assist level: Expresses basic 50 - 74% of the time/requires cueing 25 - 49% of the time. Needs to repeat parts of sentences.  Function - Social Interaction Social Interaction assist level: Interacts appropriately 50 - 74% of the time - May be physically or verbally inappropriate.  Function - Problem Solving Problem solving assist level: Solves basic 25 - 49% of the time - needs direction more than half the time to initiate, plan or complete simple activities  Function - Memory Memory assist level: Recognizes or recalls 25 -  49% of the time/requires cueing 50 - 75% of the time Patient normally able to recall (first 3 days only): Location of own room  Medical Problem List and Plan: 1.  Right sided weakness and dysarthria secondary to left corona radiata infarct as well as remote right MCA territory infarct- Team conference today please see physician documentation under team conference tab, met with team face-to-face to discuss problems,progress, and goals. Formulized individual treatment plan based on medical history, underlying problem and comorbidities. 2.  DVT Prophylaxis/Anticoagulation: Subcutaneous Lovenox. Monitor platelet counts and any signs of bleeding 3. Pain Management: Tylenol as needed 4. Mood/bipolar disorder. No present medications.also suspect baseline dementia will get Neuropsych eval 5. Neuropsych: This patient is capable of making decisions on his own behalf. 6. Skin/Wound Care: Routine skin checks 7. Fluids/Electrolytes/Nutrition: Routine I&O , CMET with mild hypoalbuminemia,add prostat, 25-80%% intake, Megace 477m BID            8. Hypertension. Lisinopril 40 mg daily. Monitor with increased mobility,increased calan SR to 3634mImproving  Filed Vitals:   11/22/15 1437 11/23/15 0607  BP: 157/68 130/62  Pulse: 77 62  Temp: 97.7 F (36.5 C) 98.1 F (36.7 C)  Resp: 18 18   9. Hyperlipidemia. Lipitor 10. Remote history of cocaine abuse. Urine drug screen negative.   11.  Bowel and Bladder incont- likely due to poor awareness, cont toileting program 12.  Leukocytosis- trending down, now 12K d/c emipric cipro LOS (Days) 8 A FACE TO FACE EVALUATION WAS PERFORMED  KIRSTEINS,ANDREW E 11/23/2015, 7:39 AM

## 2015-11-24 ENCOUNTER — Inpatient Hospital Stay (HOSPITAL_COMMUNITY): Payer: Commercial Managed Care - HMO | Admitting: Physical Therapy

## 2015-11-24 ENCOUNTER — Inpatient Hospital Stay (HOSPITAL_COMMUNITY): Payer: Commercial Managed Care - HMO | Admitting: Occupational Therapy

## 2015-11-24 ENCOUNTER — Inpatient Hospital Stay (HOSPITAL_COMMUNITY): Payer: Commercial Managed Care - HMO

## 2015-11-24 ENCOUNTER — Inpatient Hospital Stay (HOSPITAL_COMMUNITY): Payer: Commercial Managed Care - HMO | Admitting: Speech Pathology

## 2015-11-24 ENCOUNTER — Encounter (HOSPITAL_COMMUNITY): Payer: No Typology Code available for payment source

## 2015-11-24 ENCOUNTER — Inpatient Hospital Stay (HOSPITAL_COMMUNITY): Payer: No Typology Code available for payment source | Admitting: Speech Pathology

## 2015-11-24 DIAGNOSIS — I999 Unspecified disorder of circulatory system: Secondary | ICD-10-CM

## 2015-11-24 DIAGNOSIS — F09 Unspecified mental disorder due to known physiological condition: Secondary | ICD-10-CM

## 2015-11-24 MED ORDER — VERAPAMIL HCL ER 240 MG PO TBCR
240.0000 mg | EXTENDED_RELEASE_TABLET | Freq: Every day | ORAL | Status: DC
  Administered 2015-11-24 – 2015-11-27 (×4): 240 mg via ORAL
  Filled 2015-11-24 (×4): qty 1

## 2015-11-24 NOTE — Progress Notes (Signed)
Speech Language Pathology Daily Session Note  Patient Details  Name: Dominic HoughRonald E Castles MRN: [id number] Date of Birth: [birth year]  Today's Date: 11/24/2015 SLP Individual Time: 1530-1610 SLP Individual Time Calculation (min): 40 min  Short Term Goals: Week 2: SLP Short Term Goal 1 (Week 2): Pt will consume dys 1 textures and thin liquids with mod assist verbal cues for use of swallowing precautions to clear residue from the oral cavity.   SLP Short Term Goal 2 (Week 2): Pt will be intelligible at the word level with mod assist verbal cues for precise production of consonants  SLP Short Term Goal 3 (Week 2): Pt will sustain his attention to tasks for 1-2 minute intervals with min assist verbal cues for redirection.   Skilled Therapeutic Interventions: Skilled ST focused on dysphagia therapy and communication goals. SLP facilitated the session by providing puree boluses. Pt with no oral movement in response to bolus. Pt with continued open mouth posture, no lingual movement and complete anterior loss of each bolus. Tactile stimulation provided to pharynx with no swallow initiation felt for any bolus presented. Pt eagerly attempted to feed himself but even with max multi modality cues, pt was not able to trigger a swallow during the session. All boluses were lost anteriorly. MD was in to assess pt during session and expressed that pt's lips appeared swollen with obvious ulcer on right corner. Pt unable to imitate any facial movements. Extensive education provided to nursing and CNA on pt's inability to manipulate bolus or trigger a swallow. Nursing voiced understanding and will proceed with future PO intake with caution. Pt's sister was in room and voiced understanding as well. Pt "gurnted" in response to any questions by SLP. Pt able to physically select between two food items. Was given max multi-modality cues and no effort was made by pt to speak. Pt left in recliner with safety belt and all needs within  reach. Will monitor again on next available time.   Function:  Eating Eating   Modified Consistency Diet: Yes Eating Assist Level: Helper checks for pocketed food   Eating Set Up Assist For: Opening containers Helper Scoops Food on Utensil: Occasionally     Cognition Comprehension Comprehension assist level: Understands basic 25 - 49% of the time/ requires cueing 50 - 75% of the time  Expression   Expression assist level: Expresses basis less than 25% of the time/requires cueing >75% of the time.  Social Interaction Social Interaction assist level: Interacts appropriately 25 - 49% of time - Needs frequent redirection.  Problem Solving Problem solving assist level: Solves basic less than 25% of the time - needs direction nearly all the time or does not effectively solve problems and may need a restraint for safety  Memory Memory assist level: Recognizes or recalls less than 25% of the time/requires cueing greater than 75% of the time    Pain Pain Assessment Pain Assessment: No/denies pain  Therapy/Group: Individual Therapy  Maira Christon 11/24/2015, 4:25 PM

## 2015-11-24 NOTE — Progress Notes (Signed)
Physical Therapy Session Note  Patient Details  Name: Dominic Pineda MRN: [id number] Date of Birth: [birth year]  Today's Date: 11/24/2015 PT Individual Time: 1015-1100 PT Individual Time Calculation (min): 45 min   Short Term Goals: Week 1:  PT Short Term Goal 1 (Week 1): = LTGs due to ELOS  Skilled Therapeutic Interventions/Progress Updates:   Patient asleep in recliner upon arrival, difficult to arouse requiring increased time and max multimodal cues for arousal. Seated BP assessed = 117/71, HR 63, Sp02 95% and patient extremely lethargic until instructed to stand with +2A for safety. In standing using RW with supervision, RN administered medications. Gait training using RW from room > ADL apartment > therapy gym x 150 ft total with 1 seated rest break and close supervision-min guard. Performed sit <> stand from low compliant couch surface using RW x 2 with mod A > min A. Stair training up/down 12 (6") stairs using 2 rails with min guard. Instructed in NuStep using BUE/BLE at level 1 x 5 min total with max multimodal cues and manual assist to initiate/attend to task. Patient ambulated back to room using RW x 150 ft with supervision with min cues to locate room and left in recliner with quick release belt on and call bell in lap, family present.   Therapy Documentation Precautions:  Precautions Precautions: Fall Restrictions Weight Bearing Restrictions: No Pain: Pain Assessment Pain Assessment: Faces Faces Pain Scale: No hurt  See Function Navigator for Current Functional Status.   Therapy/Group: Individual Therapy  Kerney ElbeVarner, Rael Tilly A 11/24/2015, 11:03 AM

## 2015-11-24 NOTE — Progress Notes (Signed)
Subjective/Complaints: Remains somnolent in ams, participating in therapy Met daughter and updated progress. ROS-No CP or SOB, no limb pain Objective: Vital Signs: Blood pressure 102/40, pulse 65, temperature 98.7 F (37.1 C), temperature source Oral, resp. rate 18, height _0  (1.778 m), weight 125.193 kg (276 lb), SpO2 98 %. No results found. Results for orders placed or performed during the hospital encounter of 11/15/15 (from the past 72 hour(s))  Glucose, capillary     Status: Abnormal   Collection Time: 11/21/15 12:07 PM  Result Value Ref Range   Glucose-Capillary 122 (H) 65 - 99 mg/dL  CBC     Status: Abnormal   Collection Time: 11/21/15 12:15 PM  Result Value Ref Range   WBC 12.0 (H) 4.0 - 10.5 K/uL   RBC 4.94 4.22 - 5.81 MIL/uL   Hemoglobin 13.9 13.0 - 17.0 g/dL   HCT 42.7 39.0 - 52.0 %   MCV 86.4 78.0 - 100.0 fL   MCH 28.1 26.0 - 34.0 pg   MCHC 32.6 30.0 - 36.0 g/dL   RDW 14.1 11.5 - 15.5 %   Platelets 218 150 - 400 K/uL  Basic metabolic panel     Status: Abnormal   Collection Time: 11/21/15 12:15 PM  Result Value Ref Range   Sodium 141 135 - 145 mmol/L   Potassium 4.0 3.5 - 5.1 mmol/L   Chloride 106 101 - 111 mmol/L   CO2 25 22 - 32 mmol/L   Glucose, Bld 128 (H) 65 - 99 mg/dL   BUN 18 6 - 20 mg/dL   Creatinine, Ser 1.37 (H) 0.61 - 1.24 mg/dL   Calcium 9.6 8.9 - 10.3 mg/dL   GFR calc non Af Amer 52 (L) >60 mL/min   GFR calc Af Amer >60 >60 mL/min    Comment: (NOTE) The eGFR has been calculated using the CKD EPI equation. This calculation has not been validated in all clinical situations. eGFR's persistently <60 mL/min signify possible Chronic Kidney Disease.    Anion gap 10 5 - 15  Creatinine, serum     Status: None   Collection Time: 11/22/15  5:00 AM  Result Value Ref Range   Creatinine, Ser 1.18 0.61 - 1.24 mg/dL   GFR calc non Af Amer >60 >60 mL/min   GFR calc Af Amer >60 >60 mL/min    Comment: (NOTE) The eGFR has been calculated using the CKD  EPI equation. This calculation has not been validated in all clinical situations. eGFR's persistently <60 mL/min signify possible Chronic Kidney Disease.      HEENT: normal Cardio: RRR Resp: CTA B/L and unlabored GI: BS positive and NT, ND Extremity:  No Edema Skin:   Intact Neuro: Lethargic, Normal Sensory, Abnormal Motor right Delt bi tri grip 4/5, R HF 4/5 R ADF 4/5 , left side 5/5, normal tone and Abnormal FMC Ataxic/ dec FMC,dysarthric very poor speech intelligibility Musc/Skel:  Other no pain with UE or LE ROM Gen NAD   Assessment/Plan: 1. 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 Physiatrist is providing close team supervision and 24 hour management of active medical problems listed below. Physiatrist and rehab team continue to assess barriers to discharge/monitor patient progress toward functional and medical goals. FIM: Function - Bathing Position: Wheelchair/chair at sink Body parts bathed by patient: Right arm, Left arm, Chest, Abdomen, Front perineal area, Right upper leg, Left upper leg  Body parts bathed by helper: Right lower leg, Left lower leg, Back, Buttocks Bathing not applicable: Left upper leg, Right upper leg, Buttocks, Front perineal area (washed earlier by nursing) Assist Level: Touching or steadying assistance(Pt > 75%)  Function- Upper Body Dressing/Undressing What is the patient wearing?: Pull over shirt/dress Pull over shirt/dress - Perfomed by patient: Thread/unthread right sleeve, Put head through opening, Pull shirt over trunk, Thread/unthread left sleeve Pull over shirt/dress - Perfomed by helper: Thread/unthread left sleeve Assist Level: Supervision or verbal cues Function - Lower Body Dressing/Undressing What is the patient wearing?: Underwear, Non-skid slipper socks, Pants Position: Wheelchair/chair at  sink Underwear - Performed by patient: Thread/unthread left underwear leg, Pull underwear up/down Underwear - Performed by helper: Thread/unthread right underwear leg Pants- Performed by patient: Thread/unthread right pants leg, Thread/unthread left pants leg, Pull pants up/down Pants- Performed by helper: Thread/unthread right pants leg, Thread/unthread left pants leg, Pull pants up/down Non-skid slipper socks- Performed by helper: Don/doff right sock, Don/doff left sock Shoes - Performed by helper: Don/doff right shoe, Don/doff left shoe Assist for footwear: Dependant Assist for lower body dressing: Touching or steadying assistance (Pt > 75%)  Function - Toileting Toileting activity did not occur: No continent bowel/bladder event Toileting steps completed by patient: Adjust clothing prior to toileting, Performs perineal hygiene, Adjust clothing after toileting Toileting steps completed by helper: Adjust clothing prior to toileting, Performs perineal hygiene, Adjust clothing after toileting Toileting Assistive Devices: Grab bar or rail Assist level: Supervision or verbal cues  Function - Air cabin crew transfer activity did not occur:  (no continent voids or stool)  Function - Chair/bed transfer Chair/bed transfer method: Ambulatory Chair/bed transfer assist level: Supervision or verbal cues Chair/bed transfer assistive device: Armrests, Walker Chair/bed transfer details: Tactile cues for initiation, Manual facilitation for weight shifting, Verbal cues for safe use of DME/AE  Function - Locomotion: Wheelchair Will patient use wheelchair at discharge?: Yes Type: Manual Max wheelchair distance: 50 Assist Level: Touching or steadying assistance (Pt > 75%) Wheel 150 feet activity did not occur: Safety/medical concerns (DOE- propel x 50') Assist Level: Touching or steadying assistance (Pt > 75%) Turns around,maneuvers to table,bed, and toilet,negotiates 3% grade,maneuvers on  rugs and over doorsills: No Function - Locomotion: Ambulation Assistive device: Walker-rolling Max distance: 150 Assist level: Supervision or verbal cues Assist level: Supervision or verbal cues Walk 50 feet with 2 turns activity did not occur: Safety/medical concerns (DOE on exertion ; gait x 30') Assist level: Supervision or verbal cues Walk 150 feet activity did not occur: Safety/medical concerns (DOE - gait x 30') Assist level: Supervision or verbal cues Assist level: Touching or steadying assistance (Pt > 75%) (without AD)  Function - Comprehension Comprehension: Auditory Comprehension assist level: Understands basic 25 - 49% of the time/ requires cueing 50 - 75% of the time  Function - Expression Expression: Verbal Expression assist level: Expresses basis less than 25% of the time/requires cueing >75% of the time.  Function - Social Interaction Social Interaction assist level: Interacts appropriately 25 - 49% of time - Needs frequent redirection.  Function - Problem Solving Problem solving assist level: Solves basic less than 25% of the time - needs direction nearly all the time or does not effectively solve problems and may need a restraint for safety  Function - Memory Memory assist level: Recognizes or recalls less than 25% of the time/requires cueing greater than 75% of the time Patient normally able to recall (first 3 days only): Location of own room  Medical Problem List and Plan: 1.  Right sided weakness and dysarthria secondary to left corona radiata infarct as well as remote right MCA territory infarct-cont rehab , family pursuing placement through [place]   2.  DVT Prophylaxis/Anticoagulation: Subcutaneous Lovenox. Monitor platelet counts and any signs of bleeding 3. Pain Management: Tylenol as needed 4. Mood/bipolar disorder. No present medications.also suspect baseline dementia will get Neuropsych eval 5. Neuropsych: This patient is capable of making decisions on his  own behalf. 6. Skin/Wound Care: Routine skin checks 7. Fluids/Electrolytes/Nutrition: Routine I&O , CMET with mild hypoalbuminemia,add prostat, 25-80%% intake, Megace 469m BID            8. Hypertension. Lisinopril 40 mg daily. Monitor with increased mobility,increased calan SR to 3625mImproving, but BP running on low side, reported to have one episode of orthostasis, will reduce Calan again to 24075mFiled Vitals:   11/23/15 1425 11/23/15 1427  BP: 110/47 102/40  Pulse: 75 65  Temp: 98.7 F (37.1 C)   Resp: 18    9. Hyperlipidemia. Lipitor 10. Remote history of cocaine abuse. Urine drug screen negative.   11.  Bowel and Bladder incont- likely due to poor awareness, cont toileting program  LOS (Days) 9 A FACE TO FACE EVALUATION WAS PERFORMED  KIRSTEINS,ANDREW E 11/24/2015, 7:14 AM

## 2015-11-24 NOTE — Progress Notes (Signed)
Occupational Therapy Weekly Progress Note  Patient Details  Name: Dominic Pineda MRN: [id number] Date of Birth: 1951-01-31  Beginning of progress report period: November 16, 2015 End of progress report period: November 24, 2015  Today's Date: 11/24/2015 OT Individual Time: 5035-4656 OT Individual Time Calculation (min): 70 min    Patient has met 2 of 4 short term goals.  Pt currently completes ADLs with min assist overall and max instructional step by step cueing.  He is consistently performing functional transfers using the RW with supervision including sit to stand.  Still with decreased ability to maintain sustained attention for initiating and sequencing through all tasks however.  Decreased ability to reach either LE for donning and doffing socks and shoes.  At times he needs assist with donning his incontinence brief over one of his LEs but can usually get the other.  Mod instructional cueing to pull them all the way up over his hips.  Over the past week there has been a noticeable decline in his speech with increased drooling noted and inability to manage his secretions.  Feel pt is making slow but steady progress with OT at this time.  Do not feel he will reach supervision level for LB dressing goals so will downgrade.  Plan for pt to transfer to SNF at discharge for further rehab as his daughters cannot provide 24 hour supervision at discharge.    Patient continues to demonstrate the following deficits: decreased balance, decreased initiation, decreased awareness, decreased sustained attention, and therefore will continue to benefit from skilled OT intervention to enhance overall performance with BADL.  Patient progressing toward long term goals..  Continue plan of care.  OT Short Term Goals Week 2:  OT Short Term Goal 1 (Week 2): Continue working on supervision to min assist level goals.    Skilled Therapeutic Interventions/Progress Updates:    Pt began session by practicing toilet  transfer in the ADL apartment with supervision using the RW.  He was able to manage his clothing with supervision as well.  Max demonstrational cueing needed for washing hands at the sink after toileting.  He needed supervision for transfer in and out of the bed with min assist for stepping over the edge of the tub shower using the grab bars for support.  Pt ambulated with supervision from the ADL apartment to the therapy gym for next part of session.  Had pt complete 10 mins using the UE ergonometer, 2 sets of 5 mins on level 8 resistance.  Pt needing max constant demonstrational cueing to maintain sustained attention to task as he would continually stop and look at therapist, pointing to go back to his room.  Pt still with severely dysarthric speech still present with drooling.  Pt ambulated to the day room and around to the [facility] gym with the RW and supervision.  Ambulated from the [facility] gym back to his room with one rest break taken for 2-3 mins at the elevators.  HR 95 BPM while resting with O2 sats at 95% as well.  Pt left in recliner with call button in reach and safety belt in place.  Pt's sister present as well.    Therapy Documentation Precautions:  Precautions Precautions: Fall Restrictions Weight Bearing Restrictions: No  Pain: Pain Assessment Pain Assessment: No/denies pain ADL: See Function Navigator for Current Functional Status.   Therapy/Group: Individual Therapy  Dominic Pineda OTR/L 11/24/2015, 4:04 PM

## 2015-11-24 NOTE — Progress Notes (Signed)
Occupational Therapy Session Note  Patient Details  Name: Dominic HoughRonald E Colwell MRN: [id number] Date of Birth: [birth year]  Today's Date: 11/24/2015 OT Individual Time: 0902-1000 OT Individual Time Calculation (min): 58 min    Short Term Goals: Week 1:  OT Short Term Goal 1 (Week 1): Pt will complete LB selfcare with supervision sit to stand using AE PRN. OT Short Term Goal 2 (Week 1): Pt will complete toilet transfers with supervision using LRAD. OT Short Term Goal 3 (Week 1): Pt will complete shower transfers with supervision using LRAD. OT Short Term Goal 4 (Week 1): Pt will tolerate standing for at least 3 mins during grooming tasks at the sink.   Skilled Therapeutic Interventions/Progress Updates:    Pt completed bathing and dressing during session.  Non-intelligible speech when asked question, with pt only responding in one word statements.  Max instructional cueing with mod assist to transfer from supine to sit EOB.  Min guard assist for ambulation into the shower to the tub bench for bathing.  Poor thoroughness with bathing, with pt needed max instructional cueing for washing all parts of his UB as well as his upper legs and to rinse them off as he would only wash one spot and then stop and sit without initiation of any task.  Pt continues to drool throughout sessions with max instructional cueing to wipe off his stomach as he does not initiate correction of this.  Supervision for UB dressing with mod instructional cueing to pull the shirt down in the back.  He needed mod assist for threading LLE in his brief but he was able to complete it on the right side.  Supervision for donning pants with total assist for gripper socks.  Therapist used suction brush for cleaning out his mouth.  Noted bleeding blister on the right lower lip, nursing notified of this as well.  Pt left in bedside chair with call button and safety belt in place.   Therapy Documentation Precautions:  Precautions Precautions:  Fall Restrictions Weight Bearing Restrictions: No  Pain: Pain Assessment Pain Assessment: Faces Faces Pain Scale: No hurt ADL: See Function Navigator for Current Functional Status.   Therapy/Group: Individual Therapy  Belvie Iribe OTR/L 11/24/2015, 12:07 PM

## 2015-11-24 NOTE — Consult Note (Signed)
Date: 11/24/2015               Patient Name:  Dominic Pineda MRN: [id number]  DOB: Sep 17, 1950 Age / Sex: 65 y.o., male   PCP: Provider Not In System         Requesting Physician: Dr. Erick ColaceAndrew E Kirsteins, MD    Consulting Reason:  Query Angioedema     Chief Complaint: Lip Swelling  History of Present Illness:   Patient was admitted to our service on 4/14 for slurred speech and left facial droop. 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 He was transferred to inpatient rehab on 4/18. His baseline now is unintelligible speech with right-sided weakness. The patient was found to have a swollen lower lip by Physiatrist Dr. Wynn BankerKirsteins on 4/27. Suspected angioedema, lisinopril was discontinued. Patient's symptoms had nearly resolved, apparently, according to his 4/18 discharge summary. Patient was apparently IMTS was consulted.  We went to evaluate the patient and the patient was sleeping comfortably. His speech, as mentioned in previous progress notes, was unintelligible. We attempted to communicate through yes or no questions with hand squeezing, but his answers were inconsistent. Therefore, we concluded the patient could not provide any meaningful history.   Meds: Current Facility-Administered Medications  Medication Dose Route Frequency Provider Last Rate Last Dose  . 0.45 % sodium chloride infusion   Intravenous Continuous Mcarthur RossettiDaniel J Angiulli, PA-C 75 mL/hr at 11/23/15 1858    . acetaminophen (TYLENOL) tablet 650 mg  650 mg Oral Q4H PRN Mcarthur Rossettianiel J Angiulli, PA-C       Or  . acetaminophen (TYLENOL) suppository 650 mg  650 mg Rectal Q4H PRN Mcarthur Rossettianiel J Angiulli, PA-C      . aspirin EC tablet 81 mg  81 mg Oral Daily Mcarthur RossettiDaniel J Angiulli, PA-C   81 mg at 11/24/15 1015  . atorvastatin (LIPITOR) tablet 40 mg  40 mg Oral q1800 Mcarthur Rossettianiel J Angiulli, PA-C   40 mg at 11/23/15 1801  . cholecalciferol (VITAMIN D) tablet 2,000 Units  2,000 Units Oral  Daily Mcarthur RossettiDaniel J Angiulli, PA-C   2,000 Units at 11/24/15 1016  . clopidogrel (PLAVIX) tablet 75 mg  75 mg Oral Daily Mcarthur RossettiDaniel J Angiulli, PA-C   75 mg at 11/24/15 1308  . enoxaparin (LOVENOX) injection 40 mg  40 mg Subcutaneous Q24H Mcarthur Rossettianiel J Angiulli, PA-C   40 mg at 11/24/15 1647  . hydrocerin (EUCERIN) cream   Topical BID Evlyn KannerPamela S Love, PA-C      . ondansetron [facility](ZOFRAN) tablet 4 mg  4 mg Oral Q6H PRN Mcarthur Rossettianiel J Angiulli, PA-C       Or  . ondansetron (ZOFRAN) injection 4 mg  4 mg Intravenous Q6H PRN Daniel J Angiulli, PA-C      . senna-docusate (Senokot-S) tablet 1 tablet  1 tablet Oral QHS PRN Mcarthur Rossettianiel J Angiulli, PA-C      . sorbitol 70 % solution 30 mL  30 mL Oral Daily PRN Daniel J Angiulli, PA-C      . verapamil (CALAN-SR) CR tablet 240 mg  240 mg Oral QHS Erick ColaceAndrew E Kirsteins, MD        Allergies: Allergies as of 11/15/2015 - Review Complete 11/15/2015  Allergen Reaction Noted  . Codeine Nausea Only 09/22/2011   Past Medical History  Diagnosis Date  . Hypertension   . Bipolar 1 disorder (HCC)   . Incontinence   . Mental disorder   .  Depression   . Anxiety   . Blood transfusion 1970's  . Shortness of breath     has had a recent increase in episodes of shortness of breath and hyperventilating  . Stroke [facility]) 2009-2010    "have had 3 strokes"; denies residual  . BPH (benign prostatic hyperplasia)   . H/O acute renal failure 08/2011    severe w/hematuria  . Cocaine abuse    Past Surgical History  Procedure Laterality Date  . Incision and drainage of wound      right knee "got piece of wire in it while mowing"  . Inguinal hernia repair      left  . Tonsillectomy      "as a child"   Family History  Problem Relation Age of Onset  . Coronary artery disease Father    Social History   Social History  . Marital Status: Divorced    Spouse Name: N/A  . Number of Children: N/A  . Years of Education: N/A   Occupational History  . Not on file.   Social History Main Topics  .  Smoking status: Former Smoker -- 1.00 packs/day for 18 years    Types: Cigarettes    Quit date: 07/30/1986  . Smokeless tobacco: Never Used  . Alcohol Use: 0.0 oz/week    0 Standard drinks or equivalent per week     Comment: "quit drinking ~ 2007"  . Drug Use: Yes    Special: "Crack" cocaine, Marijuana     Comment: last crack use 07/2015  . Sexual Activity: Yes   Other Topics Concern  . Not on file   Social History Narrative    Review of Systems: Unable to obtain ROS due to unintelligible speech  Physical Exam: Blood pressure 129/68, pulse 75, temperature 97.4 F (36.3 C), temperature source Oral, resp. rate 18, height  (1.778 m), weight 276 lb (125.193 kg), SpO2 92 %. General: Sleeping in bed, easily arousable, NAD HEENT: [place]/AT, MMM, Moderately swollen lip with aphthous ulcers on the right inner surface of his lip, patient would not permit [place] to check his airway, no stridor Cardiovascular: RRR, no m/r/g Pulmonary: CTAB, although not taking full respiratory excursions when asked. Breathing is unlabored and he is in no respiratory distress Abdominal: Soft NT/ND. Normal BS Extremities: No clubbing, cyanosis or edema Neurological: Patient largely grunts and is not consistently following commands. Lethargic.   Lab results: Basic Metabolic Panel:  Recent Labs  40/98/11 0500  CREATININE 1.18    Assessment, Plan, & Recommendations by Problem:  Lip Swelling: Very mild swelling and patient is now protecting his airway. He is in now respiratory distress. He will benefit from supportive management at this time. Not only the lisinopril, but also the Megace may be contributing to this probable angioedema, so both of these should be discontinued for now.  - Supportive management - Discontinue Lisinopril and Megace - Epinephrine if patient is in respiratory distress or loses airway  We will see the patient tomorrow to assess for improvement.  The patient does not have a  current PCP (Provider Not In System) and does not need an [facility] hospital follow-up appointment after discharge.  The patient does have transportation limitations that hinder transportation to clinic appointments.  Signed: Ruben Im, MD 11/24/2015, 5:29 PM

## 2015-11-24 NOTE — Progress Notes (Addendum)
Called to patient room because he is not swallowing well. Working with speech therapy. Patient is alert at his usual baseline. His lower lip is swollen he does have a ulcer on the buccal surface of the lip on the right side. He attempts to swallow his difficulty propelling the bolus toward his pharynx.  Evidence of angioedema this may be the issue causing his worsening swallowing. We will discontinue ACE inhibitor. Check CT of the head to cover differential, history of multiple strokes in the past. Evidence of diffuse small vessel disease

## 2015-11-25 ENCOUNTER — Inpatient Hospital Stay (HOSPITAL_COMMUNITY): Payer: Commercial Managed Care - HMO

## 2015-11-25 ENCOUNTER — Inpatient Hospital Stay (HOSPITAL_COMMUNITY): Payer: No Typology Code available for payment source | Admitting: Physical Therapy

## 2015-11-25 ENCOUNTER — Inpatient Hospital Stay (HOSPITAL_COMMUNITY): Payer: Commercial Managed Care - HMO | Admitting: Occupational Therapy

## 2015-11-25 ENCOUNTER — Inpatient Hospital Stay (HOSPITAL_COMMUNITY): Payer: Commercial Managed Care - HMO | Admitting: Speech Pathology

## 2015-11-25 DIAGNOSIS — R4182 Altered mental status, unspecified: Secondary | ICD-10-CM

## 2015-11-25 LAB — BLOOD GAS, ARTERIAL
ACID-BASE DEFICIT: 1.8 mmol/L (ref 0.0–2.0)
Bicarbonate: 22.1 mEq/L (ref 20.0–24.0)
DRAWN BY: 270221
FIO2: 0.21
O2 Saturation: 94 %
PH ART: 7.414 (ref 7.350–7.450)
Patient temperature: 98.6
TCO2: 23.2 mmol/L (ref 0–100)
pCO2 arterial: 35.2 mmHg (ref 35.0–45.0)
pO2, Arterial: 71.3 mmHg — ABNORMAL LOW (ref 80.0–100.0)

## 2015-11-25 LAB — URINALYSIS, ROUTINE W REFLEX MICROSCOPIC
Glucose, UA: NEGATIVE mg/dL
HGB URINE DIPSTICK: NEGATIVE
Ketones, ur: 15 mg/dL — AB
Leukocytes, UA: NEGATIVE
Nitrite: NEGATIVE
PROTEIN: NEGATIVE mg/dL
Specific Gravity, Urine: 1.025 (ref 1.005–1.030)
pH: 6 (ref 5.0–8.0)

## 2015-11-25 LAB — COMPREHENSIVE METABOLIC PANEL
ALBUMIN: 3.6 g/dL (ref 3.5–5.0)
ALK PHOS: 90 U/L (ref 38–126)
ALT: 14 U/L — AB (ref 17–63)
AST: 13 U/L — AB (ref 15–41)
Anion gap: 11 (ref 5–15)
BUN: 20 mg/dL (ref 6–20)
CALCIUM: 9 mg/dL (ref 8.9–10.3)
CO2: 19 mmol/L — AB (ref 22–32)
CREATININE: 1.23 mg/dL (ref 0.61–1.24)
Chloride: 110 mmol/L (ref 101–111)
GFR calc Af Amer: >60 mL/min (ref >60)
GFR calc non Af Amer: 59 mL/min — ABNORMAL LOW (ref >60)
GLUCOSE: 99 mg/dL (ref 65–99)
Potassium: 4 mmol/L (ref 3.5–5.1)
SODIUM: 140 mmol/L (ref 135–145)
Total Bilirubin: 1.4 mg/dL — ABNORMAL HIGH (ref 0.3–1.2)
Total Protein: 7 g/dL (ref 6.5–8.1)

## 2015-11-25 LAB — GLUCOSE, CAPILLARY: Glucose-Capillary: 100 mg/dL — ABNORMAL HIGH (ref 65–99)

## 2015-11-25 LAB — CBC
HCT: 41.1 % (ref 39.0–52.0)
HEMOGLOBIN: 13.5 g/dL (ref 13.0–17.0)
MCH: 28.8 pg (ref 26.0–34.0)
MCHC: 32.8 g/dL (ref 30.0–36.0)
MCV: 87.6 fL (ref 78.0–100.0)
Platelets: 222 10*3/uL (ref 150–400)
RBC: 4.69 MIL/uL (ref 4.22–5.81)
RDW: 14 % (ref 11.5–15.5)
WBC: 11.1 10*3/uL — ABNORMAL HIGH (ref 4.0–10.5)

## 2015-11-25 LAB — URINE CULTURE

## 2015-11-25 LAB — TROPONIN I

## 2015-11-25 LAB — BRAIN NATRIURETIC PEPTIDE: B NATRIURETIC PEPTIDE 5: 30.7 pg/mL (ref 0.0–100.0)

## 2015-11-25 NOTE — Progress Notes (Signed)
Social Work Patient ID: Dominic Pineda, male   DOB: 03/19/1951, 65 y.o.   MRN: [id number]   CSW met with pt, pt's dtr, and pt's sister to update them on team conference discussion.  Dtrs are both concerned that pt will be more care than sister can provide at home and she is having surgery soon.  Sister was unsure about pt going to SNF, but she has agreed with pt's dtrs that short term SNF could only make pt stronger and prepare him better to return home.  CSW contacted [employer] case manager to pursue SNF, but she had to send pt's case for review to medical review team and pt was denied SNF coverage.  Pt's dtr, Radiah, went to both [place] in [place] and met with a LCSW there.  This CSW spoke with her 11-24-15 and learned the process for placement in the [place].  CSW faxed referral to them with documentation and they will review by 11 AM on 11-25-15 and let CSW know if they can accept pt.  Dtr learned that transfer would likely occur next Monday or Tuesday and [place] would provide transportation.  CSW will continue to follow and assist as needed.

## 2015-11-25 NOTE — Progress Notes (Signed)
Occupational Therapy Session Note  Patient Details  Name: Dominic HoughRonald E Azbell MRN: [id number] Date of Birth: 1950-09-12  Today's Date: 11/25/2015 OT Individual Time: 1102-1202 OT Individual Time Calculation (min): 60 min    Short Term Goals: Week 2:  OT Short Term Goal 1 (Week 2): Continue working on supervision to min assist level goals.    Skilled Therapeutic Interventions/Progress Updates:    Pt completed bathing and dressing sit to stand at the bedside chair.  Still with severe dysarthria and drooling when sitting up.  Max demonstrational cueing to initiate all tasks including transfer in and out of bed so EKG could be performed.  Mod physical assist to complete supine to sit with min guard for stand pivot transfer to bedside chair for bathing task.  Pt with decreased initiation with therapist having to physically give him the washcloth to wash or place his hand in the wash basin for him to wring out cloth and work on bathing.  Max step by step cueing to wash all UB with therapist providing max assist for LB secondary to decreased initiation.  He was able to donn clothing with more efficiency when presented including donning brief, pants, and shirt with no more than min assist total.  Pt left in recliner chair at end of session with his sister Aggie Cosierheresa present.  She was able to observe most of his bathing and dressing this session.  Safety belt and call button in place.    Therapy Documentation Precautions:  Precautions Precautions: Fall Restrictions Weight Bearing Restrictions: No  Pain: Pain Assessment Pain Assessment: No/denies pain Faces Pain Scale: Hurts even more Pain Location: Mouth Pain Descriptors / Indicators: Tender;Sore;Grimacing Pain Onset: On-going ADL: See Function Navigator for Current Functional Status.   Therapy/Group: Individual Therapy  Kaydin Labo OTR/L 11/25/2015, 12:56 PM

## 2015-11-25 NOTE — Progress Notes (Signed)
Occupational Therapy Session Note  Patient Details  Name: Dominic HoughRonald E Pettibone MRN: [id number] Date of Birth: 20-May-1951  Today's Date: 11/25/2015 OT Individual Time: 1300-1331 OT Individual Time Calculation (min): 31 min    Short Term Goals: Week 2:  OT Short Term Goal 1 (Week 2): Continue working on supervision to min assist level goals.    Skilled Therapeutic Interventions/Progress Updates:    Pt worked on Public house managerfinishing self feeding with max demonstrational cueing for initiation and completion, as well as to follow swallowing precautions.  Therapist performed oral suction and brushing to clean out food residue.  Noted pt still with drooling and unable to intake any liquids as the poured back out of his mouth.  Completed sit to stand X 2 for application of new condom cath as well with min guard assist.  He was able to ambulate to the ortho gym with supervision using the RW and then back to the room.  Pt left in bedside recliner with safety belt in place.    Therapy Documentation Precautions:  Precautions Precautions: Fall Restrictions Weight Bearing Restrictions: No  Pain: Pain Assessment Pain Assessment: No/denies pain Faces Pain Scale: Hurts even more Pain Location: Mouth Pain Descriptors / Indicators: Tender;Sore;Grimacing Pain Onset: On-going ADL: See Function Navigator for Current Functional Status.   Therapy/Group: Individual Therapy  Barnet Benavides OTR/L 11/25/2015, 3:47 PM

## 2015-11-25 NOTE — Progress Notes (Signed)
Speech Language Pathology Daily Session Note  Patient Details  Name: Dominic Pineda MRN: [id number] Date of Birth: [birth year]  Today's Date: 11/25/2015 SLP Individual Time: 1420-1500 SLP Individual Time Calculation (min): 40 min  Short Term Goals: Week 1: SLP Short Term Goal 1 (Week 1): STG=LTG due to ELOS for SLP   Skilled Therapeutic Interventions:   Pt was seen for skilled ST targeting goals for dysphagia and communication.  Pt consumed a functional snack of ice cream and cranberry juice to continue to address toleration of currently prescribed diet.  Pt with consistent swallow initiation when consuming purees and liquids.  He demonstrated initial cough x1 following large consecutive boluses which was mitigated with max assist verbal and tactile cues for rate and portion control.  Pt continues to present with copious anterior labial loss of purees and liquids in addition to his saliva.  He was able to monitor and correct anterior labial loss with max assist verbal cues.  Pt also able to follow 1 step commands today.  SLP facilitated the session with a structured task targeting intelligibility at the word level given that pt was slightly more intelligible in context and attempted to formulate words versus grunting.  Pt was intelligible at the word level when reading 2-3 syllable  words off of cards in 3 out of 10 opportunities with max assist verbal cues for increased volume and overarticulation.  Pt left in recliner with call bell within reach and quick release belt donned for safety.    Function:  Eating Eating   Modified Consistency Diet: Yes Eating Assist Level: Helper checks for pocketed food;Supervision or verbal cues   Eating Set Up Assist For: Opening containers Helper Scoops Food on Utensil: Occasionally     Cognition Comprehension Comprehension assist level: Understands basic 50 - 74% of the time/ requires cueing 25 - 49% of the time  Expression   Expression assist level:  Expresses basic 25 - 49% of the time/requires cueing 50 - 75% of the time. Uses single words/gestures.  Social Interaction Social Interaction assist level: Interacts appropriately 50 - 74% of the time - May be physically or verbally inappropriate.  Problem Solving Problem solving assist level: Solves basic 50 - 74% of the time/requires cueing 25 - 49% of the time  Memory Memory assist level: Recognizes or recalls 25 - 49% of the time/requires cueing 50 - 75% of the time    Pain Pain Assessment Pain Assessment: No/denies pain   Therapy/Group: Individual Therapy  Jeanluc Wegman, Melanee SpryNicole L 11/25/2015, 4:13 PM

## 2015-11-25 NOTE — Progress Notes (Signed)
Subjective/Complaints: Appreciate IM note Pt denies pain or breathing problem ROS-somnolent,Y/N responses Objective: Vital Signs: Blood pressure 168/66, pulse 69, temperature 98.3 F (36.8 C), temperature source Oral, resp. rate 19, height  (1.778 m), weight 125.193 kg (276 lb), SpO2 94 %. Ct Head Wo Contrast  11/24/2015  CLINICAL DATA:  Dysphagia. History of hypertension and mental disorder. Previous stroke. EXAM: CT HEAD WITHOUT CONTRAST TECHNIQUE: Contiguous axial images were obtained from the base of the skull through the vertex without intravenous contrast. COMPARISON:  11/18/2015 FINDINGS: Ventricles are normal configuration. There is ventricular enlargement, greater than sulcal enlargement, reflecting moderate atrophy, advanced for age. This is stable from the prior study. There are no parenchymal masses or mass effect. There is an old right middle cerebral artery distribution infarct with associated encephalomalacia, stable from the prior study. There is no evidence of a recent cortical infarct. Additional spot areas of white matter hypoattenuation noted consistent chronic microvascular ischemic change. There are no extra-axial masses or abnormal fluid collections. There is no intracranial hemorrhage. Visualized sinuses and mastoid air cells are clear. No skull lesion. IMPRESSION: 1. No acute intracranial abnormalities. 2. Stable old right MCA distribution infarct. 3. Moderate atrophy.  Mild chronic microvascular ischemic change. Electronically Signed   By: Amie Portland M.D.   On: 11/24/2015 19:54   No results found for this or any previous visit (from the past 72 hour(s)).   HEENT: normal Cardio: RRR Resp: CTA B/L and unlabored GI: BS positive and NT, ND Extremity:  No Edema Skin:   Intact Neuro: Lethargic, Normal Sensory, Abnormal Motor right Delt bi tri grip 4/5, R HF 4/5 R ADF 4/5 , left side 5/5, normal tone and Abnormal FMC Ataxic/ dec FMC,dysarthric very poor speech  intelligibility Musc/Skel:  Other no pain with UE or LE ROM Gen NAD   Assessment/Plan: 1. 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 Physiatrist is providing close team supervision and 24 hour management of active medical problems listed below. Physiatrist and rehab team continue to assess barriers to discharge/monitor patient progress toward functional and medical goals. FIM: Function - Bathing Position: Shower Body parts bathed by patient: Right arm, Left arm, Chest, Abdomen, Right upper leg, Left upper leg Body parts bathed by helper: Right lower leg, Left lower leg, Back, Front perineal area, Buttocks Bathing not applicable: Left upper leg, Right upper leg, Buttocks, Front perineal area (washed earlier by nursing) Assist Level: Touching or steadying assistance(Pt > 75%)  Function- Upper Body Dressing/Undressing What is the patient wearing?: Pull over shirt/dress Pull over shirt/dress - Perfomed by patient: Thread/unthread right sleeve, Put head through opening, Pull shirt over trunk, Thread/unthread left sleeve Pull over shirt/dress - Perfomed by helper: Thread/unthread left sleeve Assist Level: Supervision or verbal cues Function - Lower Body Dressing/Undressing What is the patient wearing?: Underwear, Non-skid slipper socks, Pants Position: Sitting EOB Underwear - Performed by patient: Thread/unthread right underwear leg, Pull underwear up/down Underwear - Performed by helper: Thread/unthread left underwear leg Pants- Performed by patient: Thread/unthread right pants leg, Thread/unthread left pants leg, Pull pants up/down Pants- Performed by helper: Thread/unthread right pants leg, Thread/unthread left pants leg, Pull pants up/down Non-skid slipper socks- Performed by helper: Don/doff right sock, Don/doff left sock Shoes - Performed by  helper: Don/doff right shoe, Don/doff left shoe Assist for footwear: Dependant Assist for lower body dressing: Touching or steadying assistance (Pt > 75%)  Function - Toileting Toileting activity did not occur: No continent bowel/bladder event Toileting steps completed by patient: Adjust clothing prior to toileting, Performs perineal hygiene, Adjust clothing after toileting Toileting steps completed by helper: Adjust clothing prior to toileting, Performs perineal hygiene, Adjust clothing after toileting Toileting Assistive Devices: Grab bar or rail Assist level: Supervision or verbal cues  Function - Archivist transfer activity did not occur:  (no continent voids or stool) Toilet transfer assistive device: Bedside commode, Grab bar, Walker Assist level to toilet: Supervision or verbal cues Assist level from toilet: Supervision or verbal cues  Function - Chair/bed transfer Chair/bed transfer method: Ambulatory Chair/bed transfer assist level: Touching or steadying assistance (Pt > 75%) Chair/bed transfer assistive device: Armrests, Walker Chair/bed transfer details: Tactile cues for initiation, Manual facilitation for weight shifting, Verbal cues for safe use of DME/AE  Function - Locomotion: Wheelchair Will patient use wheelchair at discharge?: No Type: Manual Max wheelchair distance: 50 Assist Level: Touching or steadying assistance (Pt > 75%) Wheel 150 feet activity did not occur: Safety/medical concerns (DOE- propel x 50') Assist Level: Touching or steadying assistance (Pt > 75%) Turns around,maneuvers to table,bed, and toilet,negotiates 3% grade,maneuvers on rugs and over doorsills: No Function - Locomotion: Ambulation Assistive device: Walker-rolling Max distance: 150 Assist level: Touching or steadying assistance (Pt > 75%) Assist level: Touching or steadying assistance (Pt > 75%) Walk 50 feet with 2 turns activity did not occur: Safety/medical concerns (DOE on  exertion ; gait x 30') Assist level: Touching or steadying assistance (Pt > 75%) Walk 150 feet activity did not occur: Safety/medical concerns (DOE - gait x 30') Assist level: Touching or steadying assistance (Pt > 75%) Assist level: Touching or steadying assistance (Pt > 75%) (without AD)  Function - Comprehension Comprehension: Auditory Comprehension assist level: Understands basic 25 - 49% of the time/ requires cueing 50 - 75% of the time  Function - Expression Expression: Verbal Expression assist level: Expresses basic 25 - 49% of the time/requires cueing 50 - 75% of the time. Uses single words/gestures.  Function - Social Interaction Social Interaction assist level: Interacts appropriately 25 - 49% of time - Needs frequent redirection.  Function - Problem Solving Problem solving assist level: Solves basic less than 25% of the time - needs direction nearly all the time or does not effectively solve problems and may need a restraint for safety  Function - Memory Memory assist level: Recognizes or recalls less than 25% of the time/requires cueing greater than 75% of the time Patient normally able to recall (first 3 days only): Location of own room  Medical Problem List and Plan: 1.  Right sided weakness and dysarthria secondary to left corona radiata infarct as well as remote right MCA territory infarct-cont rehab , [facility] T[facility]   2.  DVT Prophylaxis/Anticoagulation: Subcutaneous Lovenox. Monitor platelet counts and any signs of bleeding 3. Pain Management: Tylenol as needed 4. Mood/bipolar disorder. No present medications.also suspect baseline dementia will get Neuropsych eval 5. Neuropsych: This patient is capable of making decisions on his own behalf. 6. Skin/Wound Care: Routine skin checks 7. Fluids/Electrolytes/Nutrition: Routine I&O , CMET with mild hypoalbuminemia,add prostat, 5-25%% intake, Megace d/ced as possible cause of angioedema,  angioedema is affecting swallow as well            8. Hypertension. Lisinopril 40 mg daily. Monitor with increased mobility,increased calan SR to  Improving, but BP running on low side, reported to have one  episode of orthostasis,  reduced Calan again to 240mg  but now Lisinopril d/ced and am BP up, IM to address Filed Vitals:   11/24/15 2022 11/25/15 0616  BP: 150/78 168/66  Pulse: 81 69  Temp:  98.3 F (36.8 C)  Resp:  19   9. Hyperlipidemia. Lipitor 10. Remote history of cocaine abuse. Urine drug screen negative.   11.  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

## 2015-11-25 NOTE — Patient Care Conference (Signed)
Inpatient RehabilitationTeam Conference and Plan of Care Update Date: 11/23/2015   Time: 10:35 AM    Patient Name: Dominic Pineda      Medical Record Number: [id number]  Date of Birth: 10-18-1950 Sex: Male         Room/Bed: 4M03C/4M03C-01 Payor Info: Payor: [employer] MEDICARE / Plan: [employer] GOLD PLUS HMO THN/NTSP / Product Type: *No Product type* /    Admitting Diagnosis: L  CVA  Admit Date/Time:  11/15/2015  4:39 PM Admission Comments: No comment available   Primary Diagnosis:  CVA (cerebral vascular accident) (HCC) Principal Problem: CVA (cerebral vascular accident) [facility])  Patient Active Problem List   Diagnosis Date Noted  . CVA (cerebral vascular accident) (HCC) 11/15/2015  . Essential hypertension   . Hemiparesis affecting left side as late effect of stroke (HCC)   . Bipolar affective disorder in remission (HCC)   . Benign essential HTN   . History of CVA (cerebrovascular accident)   . Acute blood loss anemia   . AKI (acute kidney injury) (HCC)   . Acute cerebrovascular accident (CVA) (HCC) 11/12/2015  . Substance abuse   . Severe hypertension 11/11/2015  . Stroke (HCC)   . Urinary tract infectious disease   . Facial droop 10/30/2011  . CKD (chronic kidney disease) 10/30/2011  . UTI (lower urinary tract infection) 10/30/2011  . Obstructive uropathy 09/28/2011  . ARF (acute renal failure) (HCC) 09/23/2011  . Diarrhea 09/23/2011  . Nausea & vomiting 09/23/2011  . Hyperkalemia 09/23/2011  . Polyuria 09/23/2011  . Hypertensive emergency 09/23/2011  . Bipolar 1 disorder (HCC) 09/23/2011  . Shuffling gait 09/23/2011  . Weakness generalized 09/23/2011  . H/O cocaine abuse 09/23/2011  . Hypertension   . Incontinence     Expected Discharge Date:  SNF  Team Members Present: Physician leading conference: Dr. 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 No awareness or initiation  total assist  contiune attempting timed tolieting- no success   Swallow/Nutrition/ Hydration   downgraded to dys 1 textures, thin liquids due to new onset of oral, cognitively based dysphagia   min assist   improved use of swallowing precautions, family education, trials of advanced textures    ADL's   supervision for UB bathing and dressing, min assist LB bathing and dressing.  supervision for toilet transfers and shower/tub transfers.  Decreased initiation and thoroughness during selfcare tasks.  Needs max step by step cueing for sequencing bathing tasks.    supervison overall with min assist for LB dressing  selfcare re-training, balance retraining, transfer training, therapeutic activities, pt/family education, DME education   Mobility   S gait x 100' RW and up/down steps 2 rails  S overall 150' and up/down 12 steps  activity tolerance, balance, family ed   Communication   max assist for intelligiblity at the phrase level due to new onset dysarthria, question medication effects  mod assist   intelligiblity in phrases, family education    Safety/Cognition/ Behavioral Observations  continues with moderate deficits, poor initiation of tasks, poor  sustained attention to task, poor awareness of errors, poor functional problem solving  min assist   basic cognition    Pain   denies         Skin   dry flakey skin BLE and ABD. pt picks at skin causing abarsions- head, chin, scalp  No new breakdown while on Rehab mod assist  assess skin q shift    Rehab Goals Patient on target to meet  rehab goals: Yes Rehab Goals Revised: none *See Care Plan and progress notes for long and short-term goals.  Barriers to Discharge: Patient with acute on chronic cognitive  deficits as well as incontinence and gait disorder    Possible Resolutions to Barriers:  Continue rehabilitation for now, we will likely need skilled placement for rehabilitation    Discharge Planning/Teaching Needs:  Pt's dtrs and sister would like for pt to go to the [place] in [place] to continue his rehab if a local SNF is not an option under his [employer].  none since the plan has changed to SNF   Team Discussion:  Dr. Wynn Banker talked with pt's niece who is a nurse on nights on CIR.  There are some family dynamics that have complicated pt's d/c plan.  Dr. Wynn Banker asked for neuropsychologist to see pt to assess for underlying dementia.  OT stated that pt is reaching supervision for bathing and upper body tasks, but at min assist to mod assist for lower body bathing and dressing.  PT is seeing more fatigue in pt and SLP says pt is more dysarthric with downgraded diet to D1.    Revisions to Treatment Plan:  none   Continued Need for Acute Rehabilitation Level of Care: The patient requires daily medical management by a physician with specialized training in physical medicine and rehabilitation for the following conditions: Daily direction of a multidisciplinary physical rehabilitation program to ensure safe treatment while eliciting the highest outcome that is of practical value to the patient.: Yes Daily medical management of patient stability for increased activity during participation in an intensive rehabilitation regime.: Yes Daily analysis of laboratory values and/or radiology reports with any subsequent need for medication adjustment of medical intervention for : Neurological problems  Aune Adami, Lemar Livings 11/25/2015, 2:09 PM

## 2015-11-25 NOTE — Progress Notes (Addendum)
Social Work Patient ID: Dominic Pineda, male   DOB: [birth year], 65 y.o.   MRN: [id number] Pt [facility] and will plan to transfer on Monday 5/1. [facility] to [facility]. Daughter aware and pt agreeable to this plan. Will gather paperwork and work on transfer Monday. Now being told medical issues per Dan-PA will await medical clearance for Monday transfer.

## 2015-11-25 NOTE — Progress Notes (Signed)
Physical Therapy Weekly Progress Note  Patient Details  Name: Dominic Pineda MRN: [id number] Date of Birth: 11/18/1950  Beginning of progress report period: November 16, 2015 End of progress report period: November 25, 2015  Today's Date: 11/25/2015 PT Individual Time: 0900-1000 PT Individual Time Calculation (min): 60 min   Patient has made slow progress and has met 1 of 8 long term goals.  Short term goals not set due to estimated length of stay at eval due to pt presenting at min A level and pt expected to make fast progress.  Pt has demonstrated limited progress towards supervision goals due to medical complications and pt's impaired cognition, initiation and attention to task.  Pt can perform functional mobility with as little assistance as min A but can also require +2 to perform safely due to impaired initiation and participation.  Pt remains at high falls risk as indicated by a BERG balance score of 29/56; unable to perform f/u BERG due to limited activity tolerance, impaired initiation and participation.  Due to pt slow progress and multiple medical issues, pt's family has chosen to pursue short term SNF placement for further rehabilitation to reach higher level of functional mobility independence prior to D/C home.    Patient continues to demonstrate the following deficits: impaired cognition, impaired activity tolerance, impaired initiation, attention, impaired strength, balance, gait and therefore will continue to benefit from skilled PT intervention to enhance overall performance with activity tolerance, balance, postural control, ability to compensate for deficits, functional use of  right upper extremity and right lower extremity, attention and awareness.  See Patient's Care Plan for progression toward long term goals.  Patient not progressing toward long term goals.  See goal revision..  Plan of care revisions: Goals adjusted to due to consistent min A with furniture transfer, home and  community ambulation and floor transfer goals D/C due to pt now SNF placement and due to slow progress.  Skilled Therapeutic Interventions/Progress Updates:   Pt received in bed with daughter from out of state present to observe.  Pt still presents with dysarthria, dysphagia and impaired management of secretions.  Pt still unable to form words and continues to grunt at therapist even for yes/no questions.  Pt vitals assessed in supine, sitting and standing due to pt indicating he felt lightheaded.  Pt engaged in basic functional activities of transferring supine > sit EOB, donning pants and shoes, sit <> stand from bed, standing for BP assessment and standing at sink to brush teeth and transferring to recliner to sit and finish teeth brushing task with pt requiring +2 (pt performing 50-60% of tasks) to complete all basic tasks.  Pt more awake and alert but still presents with impaired ability to initiate task or sustain task.  Even with extra time and encouragement to participate pt unable to initiate each task on his own and required assistance of PT, RN or daughter to initiate or complete each task.  Pt also unwilling to leave room for therapy today and pt unwilling to complete BERG balance re-assessment.  Pt left in recliner with quick release belt in place, daughter present and all items within reach.       Therapy Documentation Precautions:  Precautions Precautions: Fall Restrictions Weight Bearing Restrictions: No Vital Signs: Therapy Vitals Temp: 98.2 F (36.8 C) Pulse Rate: 97 BP: 105/82 mmHg Patient Position (if appropriate): Standing Oxygen Therapy SpO2: 90 % O2 Device: Not Delivered Pain: Pain Assessment Faces Pain Scale: Hurts even more Pain Location: Mouth Pain   Physical Therapy Weekly Progress Note  Patient Details  Name: Dominic Pineda MRN: [id number] Date of Birth: October 11, 1950  Beginning of progress report period: November 16, 2015 End of progress report period: November 25, 2015  Today's Date: 11/25/2015 PT Individual Time: 0900-1000 PT Individual Time Calculation (min): 60 min   Patient has made slow progress and has met 1 of 8 long term goals.  Short term goals not set due to estimated length of stay at eval due to pt presenting at min A level and pt expected to make fast progress.  Pt has demonstrated limited progress towards supervision goals due to medical complications and pt's impaired cognition, initiation and attention to task.  Pt can perform functional mobility with as little assistance as min A but can also require +2 to perform safely due to impaired initiation and participation.  Pt remains at high falls risk as indicated by a BERG balance score of 29/56; unable to perform f/u BERG due to limited activity tolerance, impaired initiation and participation.  Due to pt slow progress and multiple medical issues, pt's family has chosen to pursue short term SNF placement for further rehabilitation to reach higher level of functional mobility independence prior to D/C home.    Patient continues to demonstrate the following deficits: impaired cognition, impaired activity tolerance, impaired initiation, attention, impaired strength, balance, gait and therefore will continue to benefit from skilled PT intervention to enhance overall performance with activity tolerance, balance, postural control, ability to compensate for deficits, functional use of  right upper extremity and right lower extremity, attention and awareness.  See Patient's Care Plan for progression toward long term goals.  Patient not progressing toward long term goals.  See goal revision..  Plan of care revisions: Goals adjusted to due to consistent min A with furniture transfer, home and  community ambulation and floor transfer goals D/C due to pt now SNF placement and due to slow progress.  Skilled Therapeutic Interventions/Progress Updates:   Pt received in bed with daughter from out of state present to observe.  Pt still presents with dysarthria, dysphagia and impaired management of secretions.  Pt still unable to form words and continues to grunt at therapist even for yes/no questions.  Pt vitals assessed in supine, sitting and standing due to pt indicating he felt lightheaded.  Pt engaged in basic functional activities of transferring supine > sit EOB, donning pants and shoes, sit <> stand from bed, standing for BP assessment and standing at sink to brush teeth and transferring to recliner to sit and finish teeth brushing task with pt requiring +2 (pt performing 50-60% of tasks) to complete all basic tasks.  Pt more awake and alert but still presents with impaired ability to initiate task or sustain task.  Even with extra time and encouragement to participate pt unable to initiate each task on his own and required assistance of PT, RN or daughter to initiate or complete each task.  Pt also unwilling to leave room for therapy today and pt unwilling to complete BERG balance re-assessment.  Pt left in recliner with quick release belt in place, daughter present and all items within reach.       Therapy Documentation Precautions:  Precautions Precautions: Fall Restrictions Weight Bearing Restrictions: No Vital Signs: Therapy Vitals Temp: 98.2 F (36.8 C) Pulse Rate: 97 BP: 105/82 mmHg Patient Position (if appropriate): Standing Oxygen Therapy SpO2: 90 % O2 Device: Not Delivered Pain: Pain Assessment Faces Pain Scale: Hurts even more Pain Location: Mouth Pain

## 2015-11-25 NOTE — Consult Note (Signed)
Date: 11/25/2015               Patient Name:  Dominic Pineda MRN: [id number]  DOB: [birth year] Age / Sex: 65 y.o., male   PCP: Provider Not In System         Requesting Physician: Dr. Erick Colace, MD    Consulting Reason:  AMS, Angioedema     Chief Complaint: AMS, Lip Swelling  History of Present Illness:   Patient was admitted to our service on 4/14 for slurred speech and left facial droop. 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 He was transferred to inpatient rehab on 4/18. Prior to transfer, patient was conversant and appropriately responsive to questions.  Since transfer to CIR, patient's baseline has been variable and deteriorated.  CIR staff states patient's mental status waxes and wanes, intermittently alert and cooperative, but more often lethargic and nonconversant over the last 2 days.  The patient was also found to have a swollen lower lip by Physiatrist Dr. Wynn Banker on 4/27. With suspected angioedema, lisinopril and Megace were discontinued.    Today, patient is somnolent, intermittently spontaneously arousing, intermittently arousing to voice, and arousing to pain.  He intermittently follows verbal commands, but will not speak.     Meds: Current Facility-Administered Medications  Medication Dose Route Frequency Provider Last Rate Last Dose  . 0.45 % sodium chloride infusion   Intravenous Continuous Mcarthur Rossetti Angiulli, PA-C 75 mL/hr at 11/24/15 1909    . acetaminophen (TYLENOL) tablet 650 mg  650 mg Oral Q4H PRN Mcarthur Rossetti Angiulli, PA-C       Or  . acetaminophen (TYLENOL) suppository 650 mg  650 mg Rectal Q4H PRN Mcarthur Rossetti Angiulli, PA-C      . aspirin EC tablet 81 mg  81 mg Oral Daily Mcarthur Rossetti Angiulli, PA-C   81 mg at 11/25/15 0919  . atorvastatin (LIPITOR) tablet 40 mg  40 mg Oral q1800 Mcarthur Rossetti Angiulli, PA-C   40 mg at 11/23/15 1801  . cholecalciferol (VITAMIN D) tablet 2,000 Units  2,000 Units  Oral Daily Mcarthur Rossetti Angiulli, PA-C   2,000 Units at 11/25/15 9562  . clopidogrel (PLAVIX) tablet 75 mg  75 mg Oral Daily Mcarthur Rossetti Angiulli, PA-C   75 mg at 11/25/15 1308  . enoxaparin (LOVENOX) injection 40 mg  40 mg Subcutaneous Q24H Mcarthur Rossetti Angiulli, PA-C   40 mg at 11/24/15 1647  . hydrocerin (EUCERIN) cream   Topical BID Evlyn Kanner Love, PA-C      . ondansetron [facility]) tablet 4 mg  4 mg Oral Q6H PRN Mcarthur Rossetti Angiulli, PA-C       Or  . ondansetron (ZOFRAN) injection 4 mg  4 mg Intravenous Q6H PRN Daniel J Angiulli, PA-C      . senna-docusate (Senokot-S) tablet 1 tablet  1 tablet Oral QHS PRN Mcarthur Rossetti Angiulli, PA-C      . sorbitol 70 % solution 30 mL  30 mL Oral Daily PRN Mcarthur Rossetti Angiulli, PA-C      . verapamil (CALAN-SR) CR tablet 240 mg  240 mg Oral QHS Erick Colace, MD   240 mg at 11/24/15 2141    Allergies: Allergies as of 11/15/2015 - Review Complete 11/15/2015  Allergen Reaction Noted  . Codeine Nausea Only 09/22/2011   Past Medical History  Diagnosis Date  . Hypertension   . Bipolar 1 disorder (HCC)   .  Incontinence   . Mental disorder   . Depression   . Anxiety   . Blood transfusion 1970's  . Shortness of breath     has had a recent increase in episodes of shortness of breath and hyperventilating  . Stroke [facility]) 2009-2010    "have had 3 strokes"; denies residual  . BPH (benign prostatic hyperplasia)   . H/O acute renal failure 08/2011    severe w/hematuria  . Cocaine abuse    Past Surgical History  Procedure Laterality Date  . Incision and drainage of wound      right knee "got piece of wire in it while mowing"  . Inguinal hernia repair      left  . Tonsillectomy      "as a child"   Family History  Problem Relation Age of Onset  . Coronary artery disease Father    Social History   Social History  . Marital Status: Divorced    Spouse Name: N/A  . Number of Children: N/A  . Years of Education: N/A   Occupational History  . Not on file.    Social History Main Topics  . Smoking status: Former Smoker -- 1.00 packs/day for 18 years    Types: Cigarettes    Quit date: 07/30/1986  . Smokeless tobacco: Never Used  . Alcohol Use: 0.0 oz/week    0 Standard drinks or equivalent per week     Comment: "quit drinking ~ 2007"  . Drug Use: Yes    Special: "Crack" cocaine, Marijuana     Comment: last crack use 07/2015  . Sexual Activity: Yes   Other Topics Concern  . Not on file   Social History Narrative    Review of Systems: Unable to obtain ROS due to unintelligible speech  Physical Exam: Blood pressure 105/82, pulse 97, temperature 98.2 F (36.8 C), temperature source Oral, resp. rate 19, height 5\' 10"  (1.778 m), weight 276 lb (125.193 kg), SpO2 90 %. General: Sleeping in bed, intermittently arousable to voice, arousable to pain, NAD HEENT: [place]/AT, MMM, drooling, lip appears normal when patient closes mouth, unable to assess oral cavity due to patient cooperation Cardiovascular: RRR, no m/r/g Pulmonary: CTAB, although not taking full respiratory excursions when asked. Breathing is unlabored and he is in no respiratory distress Abdominal: Soft NT/ND. Normal BS Extremities: No clubbing, cyanosis or edema Neurological: Patient only grunts, inconsistently following commands, nodding to desire to watch TV. Lethargic. Psychiatric:   Lab results: Basic Metabolic Panel: No results for input(s): NA, K, CL, CO2, GLUCOSE, BUN, CREATININE, CALCIUM, MG, PHOS in the last 72 hours.  Assessment, Plan, & Recommendations by Problem:  Altered Mental Status: Patient's mental status definitively deteriorated compared to discharge from inpatient service.  He now only grunts to conversation, but is able to follow verbal commands when he desires.  CT head yesterday negative for acute disease, showing diffuse encephalomalacia.  He has a h/o Bipolar disorder.  Glucose normal.  Not hypoxemic.  No evidence of trauma.  No pain medications or other  psychoactive medications given recently.  Given patient's intermittent symptoms and history of CVA and encephalomalacia, this may be worsening dementia/delirium, but will continue work up to r/o infection and metabolic disorder. - aBG normal [ ]  Troponin - ECG unchanged [ ]  U/A and UDS - CXR with atelectasis and cardiomegaly. [ ]  BNP [ ]  CMP  Lip Swelling, resolved: Very mild swelling and patient is now protecting his airway. He is in now respiratory distress. He will  benefit from supportive management at this time. Not only the lisinopril, but also the Megace may be contributing to this probable angioedema, so both of these should be discontinued for now.  - Supportive management - Discontinue Lisinopril and Megace - Epinephrine if patient is in respiratory distress or loses airway  We will see the patient tomorrow to assess for improvement.  The patient does not have a current PCP (Provider Not In System) and does not need an [facility] hospital follow-up appointment after discharge.  The patient does have transportation limitations that hinder transportation to clinic appointments.  Signed: Jana Half, MD 11/25/2015, 11:09 AM

## 2015-11-25 NOTE — Plan of Care (Signed)
Problem: RH Balance Goal: LTG Patient will maintain dynamic standing balance (PT) LTG: Patient will maintain dynamic standing balance with assistance during mobility activities (PT)  Downgraded due to slow progress 11/25/15  Problem: RH Bed Mobility Goal: LTG Patient will perform bed mobility with assist (PT) LTG: Patient will perform bed mobility with assistance, with/without cues (PT).  Downgraded due to slow progress 11/25/15  Problem: RH Bed to Chair Transfers Goal: LTG Patient will perform bed/chair transfers w/assist (PT) LTG: Patient will perform bed/chair transfers with assistance, with/without cues (PT).  Downgraded due to slow progress 11/25/15  Problem: RH Car Transfers Goal: LTG Patient will perform car transfers with assist (PT) LTG: Patient will perform car transfers with assistance (PT).  Downgraded due to slow progress 11/25/15  Problem: RH Furniture Transfers Goal: LTG Patient will perform furniture transfers w/assist (OT/PT LTG: Patient will perform furniture transfers with assistance (OT/PT).  Outcome: Not Applicable Date Met:  55/83/16 D/C due to pt now SNF placement 11/25/15  Problem: RH Floor Transfers Goal: LTG Patient will perform floor transfers w/assist (PT) LTG: Patient will perform floor transfers with assistance (PT).  Outcome: Not Applicable Date Met:  74/25/52 Not appropriate at this time, D/C 11/25/15  Problem: RH Ambulation Goal: LTG Patient will ambulate in controlled environment (PT) LTG: Patient will ambulate in a controlled environment, # of feet with assistance (PT).  Downgraded due to slow progress 11/25/15 Goal: LTG Patient will ambulate in home environment (PT) LTG: Patient will ambulate in home environment, # of feet with assistance (PT).  Outcome: Not Applicable Date Met:  58/94/83 D/C due to pt now SNF placement Goal: LTG Patient will ambulate in community environment (PT) LTG: Patient will ambulate in community environment, # of feet with  assistance (PT).  Outcome: Not Applicable Date Met:  47/58/30 D/C due to not appropriate at this time 11/25/15  Problem: RH Stairs Goal: LTG Patient will ambulate up and down stairs w/assist (PT) LTG: Patient will ambulate up and down # of stairs with assistance (PT)  Downgraded due to slow progress 11/25/15

## 2015-11-26 ENCOUNTER — Inpatient Hospital Stay (HOSPITAL_COMMUNITY): Payer: Commercial Managed Care - HMO

## 2015-11-26 DIAGNOSIS — F01A0 Vascular dementia, mild, without behavioral disturbance, psychotic disturbance, mood disturbance, and anxiety: Secondary | ICD-10-CM | POA: Insufficient documentation

## 2015-11-26 DIAGNOSIS — F0150 Vascular dementia without behavioral disturbance: Secondary | ICD-10-CM | POA: Insufficient documentation

## 2015-11-26 NOTE — Progress Notes (Signed)
Occupational Therapy Session Note  Patient Details  Name: Dominic Pineda MRN: [id number] Date of Birth: 1950/10/20  Today's Date: 11/26/2015 OT Individual Time:1445-1530 45 Min  Short Term Goals: Week 2:  OT Short Term Goal 1 (Week 2): Continue working on supervision to min assist level goals.    Skilled Therapeutic Interventions/Progress Updates: ADL-retraining at shower level with focus on improved sustained attention, initiation, awareness and thoroughness during self-care.   Pt received supine in bed alert and watching television.   Pt only able to attend to therapist with grunting vocalizations although somewhat clearer during discussion of military career (pt response to questions on service branch stating he served in [employer] at location [place] [place]   Pt required max vc to engage in task and refused to ambulate to bathroom but accepted w/c for transfer and completed transfer on/off tub bench.   Pt demo'd poor initiation throughout task, poor frustration tolerance (while attempting to manipulate temperature control of water) and max tactile cues to progress.   Pt ended shower abruptly by turning off water and grunting to leave bathroom d/t cold temp.   Pt grunted for his walker and ambulated to EOB to sit with only min guard assist during ambulation.   With only setup to hold shirt, pt grabbed shirt and donned it quickly as if agitated and ceased attempt at dressing lower body but accepted brief and pants laced by therapist while he remained seated, and then he rose to allow and assist with pulling pants up.   Pt terminated session and recovered to supine in bed, using bed rails when cued to pull toward head of bed with manual facilitation to incorporate LE to perform pelvic lift off bed.     Therapy Documentation Precautions:  Precautions Precautions: Fall Restrictions Weight Bearing Restrictions: No  Vital Signs: Therapy Vitals Temp: 98.6 F (37 C) Temp Source: Oral Pulse  Rate: 88 Resp: 19 BP: (!) 145/83 mmHg Patient Position (if appropriate): Lying Oxygen Therapy SpO2: 98 % O2 Device: Not Delivered   Pain: No/denies pain  See Function Navigator for Current Functional Status.   Therapy/Group: Individual Therapy  Hannibal Skalla 11/26/2015, 7:08 AM

## 2015-11-26 NOTE — Consult Note (Signed)
Date: 11/26/2015               Patient Name:  Dominic Pineda MRN: [id number]  DOB: [birth year] Age / Sex: 65 y.o., male   PCP: Provider Not In System         Requesting Physician: Dr. Erick Colace, MD    Consulting Reason:  AMS, Angioedema     Chief Complaint: AMS, Lip Swelling  History of Present Illness:   Patient was admitted to our service on 4/14 for slurred speech and left facial droop. 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 He was transferred to inpatient rehab on 4/18. Prior to transfer, patient was conversant and appropriately responsive to questions.  Since transfer to CIR, patient's baseline has been variable and deteriorated.  CIR staff states patient's mental status waxes and wanes, intermittently alert and cooperative, but more often lethargic and nonconversant over the last 2 days.  The patient was also found to have a swollen lower lip by Physiatrist Dr. Wynn Banker on 4/27. With suspected angioedema, lisinopril and Megace were discontinued.    Today, patient is much more alert and able to answer some questions. He is able to tell me his name and that the year is 2017. He shakes his head no when I ask if anything is bothering him. His sister was present in the room and states he does not talk much at baseline. She feels "he is 90% better" than a few days ago when he had significant lip swelling.   Meds: Current Facility-Administered Medications  Medication Dose Route Frequency Provider Last Rate Last Dose  . 0.45 % sodium chloride infusion   Intravenous Continuous Mcarthur Rossetti Angiulli, PA-C 75 mL/hr at 11/25/15 1904    . acetaminophen (TYLENOL) tablet 650 mg  650 mg Oral Q4H PRN Mcarthur Rossetti Angiulli, PA-C       Or  . acetaminophen (TYLENOL) suppository 650 mg  650 mg Rectal Q4H PRN Mcarthur Rossetti Angiulli, PA-C      . aspirin EC tablet 81 mg  81 mg Oral Daily Mcarthur Rossetti Angiulli, PA-C   81 mg at 11/26/15 0800  .  atorvastatin (LIPITOR) tablet 40 mg  40 mg Oral q1800 Mcarthur Rossetti Angiulli, PA-C   40 mg at 11/25/15 1705  . cholecalciferol (VITAMIN D) tablet 2,000 Units  2,000 Units Oral Daily Mcarthur Rossetti Angiulli, PA-C   2,000 Units at 11/26/15 1012  . clopidogrel (PLAVIX) tablet 75 mg  75 mg Oral Daily Mcarthur Rossetti Angiulli, PA-C   75 mg at 11/26/15 0800  . enoxaparin (LOVENOX) injection 40 mg  40 mg Subcutaneous Q24H Mcarthur Rossetti Angiulli, PA-C   40 mg at 11/25/15 1705  . hydrocerin (EUCERIN) cream   Topical BID Evlyn Kanner Love, PA-C      . ondansetron [facility]) tablet 4 mg  4 mg Oral Q6H PRN Mcarthur Rossetti Angiulli, PA-C       Or  . ondansetron (ZOFRAN) injection 4 mg  4 mg Intravenous Q6H PRN Daniel J Angiulli, PA-C      . senna-docusate (Senokot-S) tablet 1 tablet  1 tablet Oral QHS PRN Mcarthur Rossetti Angiulli, PA-C      . sorbitol 70 % solution 30 mL  30 mL Oral Daily PRN Daniel J Angiulli, PA-C      . verapamil (CALAN-SR) CR tablet 240 mg  240 mg Oral QHS Erick Colace, MD   240 mg at  11/25/15 2112    Allergies: Allergies as of 11/15/2015 - Review Complete 11/15/2015  Allergen Reaction Noted  . Codeine Nausea Only 09/22/2011   Past Medical History  Diagnosis Date  . Hypertension   . Bipolar 1 disorder (HCC)   . Incontinence   . Mental disorder   . Depression   . Anxiety   . Blood transfusion 1970's  . Shortness of breath     has had a recent increase in episodes of shortness of breath and hyperventilating  . Stroke [facility]) 2009-2010    "have had 3 strokes"; denies residual  . BPH (benign prostatic hyperplasia)   . H/O acute renal failure 08/2011    severe w/hematuria  . Cocaine abuse    Past Surgical History  Procedure Laterality Date  . Incision and drainage of wound      right knee "got piece of wire in it while mowing"  . Inguinal hernia repair      left  . Tonsillectomy      "as a child"   Family History  Problem Relation Age of Onset  . Coronary artery disease Father    Social History    Social History  . Marital Status: Divorced    Spouse Name: N/A  . Number of Children: N/A  . Years of Education: N/A   Occupational History  . Not on file.   Social History Main Topics  . Smoking status: Former Smoker -- 1.00 packs/day for 18 years    Types: Cigarettes    Quit date: 07/30/1986  . Smokeless tobacco: Never Used  . Alcohol Use: 0.0 oz/week    0 Standard drinks or equivalent per week     Comment: "quit drinking ~ 2007"  . Drug Use: Yes    Special: "Crack" cocaine, Marijuana     Comment: last crack use 07/2015  . Sexual Activity: Yes   Other Topics Concern  . Not on file   Social History Narrative    Review of Systems: Unable to obtain ROS due to unintelligible speech  Physical Exam: Blood pressure 145/83, pulse 88, temperature 98.6 F (37 C), temperature source Oral, resp. rate 19, height 5\' 10"  (1.778 m), weight 276 lb (125.193 kg), SpO2 98 %. General: Sitting up in bed, alert, answers some questions, speech mildly improved but still unintelligible for the most part  HEENT: [place]/AT, MMM, no longer drooling, lip appears normal in size Cardiovascular: RRR, no m/r/g Pulmonary: CTA bilaterally, breaths non-labored  Abdominal: BS+, soft, obese, non-tender  Extremities: No clubbing, cyanosis or edema Neurological: More alert this morning. He is oriented to person and year. Follows most commands.    Lab results: Basic Metabolic Panel:  Recent Labs  44/03/47 1159  NA 140  K 4.0  CL 110  CO2 19*  GLUCOSE 99  BUN 20  CREATININE 1.23  CALCIUM 9.0    Assessment, Plan, & Recommendations by Problem:  Altered Mental Status: He is much more alert and interactive this morning compared to yesterday. CT head 4/27 negative for acute disease, showing diffuse encephalomalacia. Other work up, including ABG, UA, CXR, and labs negative for any hypoxia, metabolic derangement, acute MI, or infection. He is not on any sedating medications. It is possible his AMS could  be related to his untreated Bipolar disorder. Per his sister, he is close to his baseline. He does look much better today. Patient is scheduled to continue rehab at the [place] on Monday. I would recommend having Psychiatry see him at the [place]  to see if he would benefit from bipolar medications as he was previously seen by Psych there and may know him better. He is stable medically to be transferred.  Lip Swelling, resolved: Likely secondary to Lisinopril. Megace also stopped. He is no longer drooling and his lip looks better today. He is having no difficulties eating. No SOB.  - Supportive management - Discontinue Lisinopril and Megace  We will sign off at this time. Please call us back if there are any questions or concerns.   The patient does not have a current PCP (Provider Not In System) and does not need an [facility] hospital follow-up appointment after discharge.  The patient does have transportation limitations that hinder transportation to clinic appointments.  Signed: Su Hoff, MD 11/26/2015, 10:27 AM

## 2015-11-26 NOTE — Progress Notes (Signed)
Subjective/Complaints: Appreciate IM note Pt more alert than yesterday am, daughter at bedside updated her  regarding w/u ROS-somnolent,Y/N responses Objective: Vital Signs: Blood pressure 145/83, pulse 88, temperature 98.6 F (37 C), temperature source Oral, resp. rate 19, height '5\' 10"'$  (1.778 m), weight 125.193 kg (276 lb), SpO2 98 %. Ct Head Wo Contrast  11/24/2015  CLINICAL DATA:  Dysphagia. History of hypertension and mental disorder. Previous stroke. EXAM: CT HEAD WITHOUT CONTRAST TECHNIQUE: Contiguous axial images were obtained from the base of the skull through the vertex without intravenous contrast. COMPARISON:  11/18/2015 FINDINGS: Ventricles are normal configuration. There is ventricular enlargement, greater than sulcal enlargement, reflecting moderate atrophy, advanced for age. This is stable from the prior study. There are no parenchymal masses or mass effect. There is an old right middle cerebral artery distribution infarct with associated encephalomalacia, stable from the prior study. There is no evidence of a recent cortical infarct. Additional spot areas of white matter hypoattenuation noted consistent chronic microvascular ischemic change. There are no extra-axial masses or abnormal fluid collections. There is no intracranial hemorrhage. Visualized sinuses and mastoid air cells are clear. No skull lesion. IMPRESSION: 1. No acute intracranial abnormalities. 2. Stable old right MCA distribution infarct. 3. Moderate atrophy.  Mild chronic microvascular ischemic change. Electronically Signed   By: Lajean Manes M.D.   On: 11/24/2015 19:54   Dg Chest Port 1 View  11/25/2015  CLINICAL DATA:  Altered mental status EXAM: PORTABLE CHEST 1 VIEW COMPARISON:  11/21/2015 FINDINGS: Cardiomegaly. Very low lung volumes with bibasilar atelectasis and vascular congestion. No effusions or acute bony abnormality. IMPRESSION: Very low lung volumes with bibasilar atelectasis. Cardiomegaly with vascular  congestion. Electronically Signed   By: Rolm Baptise M.D.   On: 11/25/2015 11:54   Results for orders placed or performed during the hospital encounter of 11/15/15 (from the past 72 hour(s))  Urine culture     Status: Abnormal   Collection Time: 11/24/15  2:27 AM  Result Value Ref Range   Specimen Description URINE, CLEAN CATCH    Special Requests NONE    Culture 1,000 COLONIES/mL INSIGNIFICANT GROWTH (A)    Report Status 11/25/2015 FINAL   Glucose, capillary     Status: Abnormal   Collection Time: 11/25/15 10:44 AM  Result Value Ref Range   Glucose-Capillary 100 (H) 65 - 99 mg/dL   Comment 1 Notify RN   Blood gas, arterial     Status: Abnormal   Collection Time: 11/25/15 11:20 AM  Result Value Ref Range   FIO2 0.21    pH, Arterial 7.414 7.350 - 7.450   pCO2 arterial 35.2 35.0 - 45.0 mmHg   pO2, Arterial 71.3 (L) 80.0 - 100.0 mmHg   Bicarbonate 22.1 20.0 - 24.0 mEq/L   TCO2 23.2 0 - 100 mmol/L   Acid-base deficit 1.8 0.0 - 2.0 mmol/L   O2 Saturation 94.0 %   Patient temperature 98.6    Collection site LEFT RADIAL    Drawn by [id number]    Sample type ARTERIAL DRAW    Allens test (pass/fail) PASS PASS  CBC     Status: Abnormal   Collection Time: 11/25/15 11:59 AM  Result Value Ref Range   WBC 11.1 (H) 4.0 - 10.5 K/uL   RBC 4.69 4.22 - 5.81 MIL/uL   Hemoglobin 13.5 13.0 - 17.0 g/dL   HCT 41.1 39.0 - 52.0 %   MCV 87.6 78.0 - 100.0 fL   MCH 28.8 26.0 - 34.0 pg   MCHC  32.8 30.0 - 36.0 g/dL   RDW 24.3 83.6 - 54.2 %   Platelets 222 150 - 400 K/uL  Comprehensive metabolic panel     Status: Abnormal   Collection Time: 11/25/15 11:59 AM  Result Value Ref Range   Sodium 140 135 - 145 mmol/L   Potassium 4.0 3.5 - 5.1 mmol/L   Chloride 110 101 - 111 mmol/L   CO2 19 (L) 22 - 32 mmol/L   Glucose, Bld 99 65 - 99 mg/dL   BUN 20 6 - 20 mg/dL   Creatinine, Ser 7.15 0.61 - 1.24 mg/dL   Calcium 9.0 8.9 - 66.4 mg/dL   Total Protein 7.0 6.5 - 8.1 g/dL   Albumin 3.6 3.5 - 5.0 g/dL   AST  13 (L) 15 - 41 U/L   ALT 14 (L) 17 - 63 U/L   Alkaline Phosphatase 90 38 - 126 U/L   Total Bilirubin 1.4 (H) 0.3 - 1.2 mg/dL   GFR calc non Af Amer 59 (L) >60 mL/min   GFR calc Af Amer >60 >60 mL/min    Comment: (NOTE) The eGFR has been calculated using the CKD EPI equation. This calculation has not been validated in all clinical situations. eGFR's persistently <60 mL/min signify possible Chronic Kidney Disease.    Anion gap 11 5 - 15  Troponin I     Status: None   Collection Time: 11/25/15 11:59 AM  Result Value Ref Range   Troponin I <0.03 <0.031 ng/mL    Comment:        NO INDICATION OF MYOCARDIAL INJURY.   Brain natriuretic peptide     Status: None   Collection Time: 11/25/15 11:59 AM  Result Value Ref Range   B Natriuretic Peptide 30.7 0.0 - 100.0 pg/mL  Urinalysis, Routine w reflex microscopic (not at [facility])     Status: Abnormal   Collection Time: 11/25/15 10:24 PM  Result Value Ref Range   Color, Urine AMBER (A) YELLOW    Comment: BIOCHEMICALS MAY BE AFFECTED BY COLOR   APPearance CLEAR CLEAR   Specific Gravity, Urine 1.025 1.005 - 1.030   pH 6.0 5.0 - 8.0   Glucose, UA NEGATIVE NEGATIVE mg/dL   Hgb urine dipstick NEGATIVE NEGATIVE   Bilirubin Urine SMALL (A) NEGATIVE   Ketones, ur 15 (A) NEGATIVE mg/dL   Protein, ur NEGATIVE NEGATIVE mg/dL   Nitrite NEGATIVE NEGATIVE   Leukocytes, UA NEGATIVE NEGATIVE    Comment: MICROSCOPIC NOT DONE ON URINES WITH NEGATIVE PROTEIN, BLOOD, LEUKOCYTES, NITRITE, OR GLUCOSE <1000 mg/dL.     HEENT: normal Cardio: RRR Resp: CTA B/L and unlabored GI: BS positive and NT, ND Extremity:  No Edema Skin:   Intact, angioedam of bottom lip not tongue   Neuro: Lethargic, Normal Sensory, Abnormal Motor right Delt bi tri grip 4/5, R HF 4/5 R ADF 4/5 , left side 5/5, normal tone and Abnormal FMC Ataxic/ dec FMC,dysarthric very poor speech intelligibility Musc/Skel:  Other no pain with UE or LE ROM Gen NAD   Assessment/Plan: 1. Functional  deficits secondary to  left corona radiata infarct causing R hemiparesis which require 3+ hours per day of interdisciplinary therapy in a comprehensive inpatient rehab setting. Physiatrist is providing close team supervision and 24 hour management of active medical problems listed below. Physiatrist and rehab team continue to assess barriers to discharge/monitor patient progress toward functional and medical goals. FIM: Function - Bathing Position: Wheelchair/chair at sink Body parts bathed by patient: Right arm, Left arm, Chest,  Abdomen Body parts bathed by helper: Front perineal area, Buttocks, Right lower leg, Left lower leg, Left upper leg, Right upper leg, Back Bathing not applicable: Left upper leg, Right upper leg, Buttocks, Front perineal area (washed earlier by nursing) Assist Level: Touching or steadying assistance(Pt > 75%)  Function- Upper Body Dressing/Undressing What is the patient wearing?: Pull over shirt/dress Pull over shirt/dress - Perfomed by patient: Thread/unthread right sleeve, Put head through opening, Thread/unthread left sleeve Pull over shirt/dress - Perfomed by helper: Pull shirt over trunk Assist Level: Supervision or verbal cues Function - Lower Body Dressing/Undressing What is the patient wearing?: Underwear, Pants, Shoes Position: Wheelchair/chair at sink Underwear - Performed by patient: Thread/unthread right underwear leg, Pull underwear up/down, Thread/unthread left underwear leg Underwear - Performed by helper: Thread/unthread left underwear leg Pants- Performed by patient: Thread/unthread right pants leg, Thread/unthread left pants leg, Pull pants up/down Pants- Performed by helper: Thread/unthread right pants leg, Thread/unthread left pants leg, Pull pants up/down Non-skid slipper socks- Performed by helper: Don/doff right sock, Don/doff left sock Shoes - Performed by patient: Don/doff right shoe, Don/doff left shoe Shoes - Performed by helper: Don/doff  right shoe, Don/doff left shoe Assist for footwear: Dependant Assist for lower body dressing: Touching or steadying assistance (Pt > 75%)  Function - Toileting Toileting activity did not occur: No continent bowel/bladder event Toileting steps completed by patient: Adjust clothing prior to toileting, Performs perineal hygiene, Adjust clothing after toileting Toileting steps completed by helper: Adjust clothing prior to toileting, Performs perineal hygiene, Adjust clothing after toileting Toileting Assistive Devices: Grab bar or rail Assist level: Supervision or verbal cues  Function - Air cabin crew transfer activity did not occur:  (no continent voids or stool) Toilet transfer assistive device: Bedside commode, Grab bar, Walker Assist level to toilet: Supervision or verbal cues Assist level from toilet: Supervision or verbal cues  Function - Chair/bed transfer Chair/bed transfer method: Stand pivot, Ambulatory Chair/bed transfer assist level: Moderate assist (Pt 50 - 74%/lift or lower) Chair/bed transfer assistive device: Walker Chair/bed transfer details: Tactile cues for initiation, Manual facilitation for weight shifting, Verbal cues for safe use of DME/AE  Function - Locomotion: Wheelchair Will patient use wheelchair at discharge?: No Type: Manual Max wheelchair distance: 50 Assist Level: Touching or steadying assistance (Pt > 75%) Wheel 150 feet activity did not occur: Safety/medical concerns (DOE- propel x 50') Assist Level: Touching or steadying assistance (Pt > 75%) Turns around,maneuvers to table,bed, and toilet,negotiates 3% grade,maneuvers on rugs and over doorsills: No Function - Locomotion: Ambulation Assistive device: Walker-rolling Max distance: 150 Assist level: Touching or steadying assistance (Pt > 75%) Assist level: Touching or steadying assistance (Pt > 75%) Walk 50 feet with 2 turns activity did not occur: Safety/medical concerns (DOE on exertion ;  gait x 30') Assist level: Touching or steadying assistance (Pt > 75%) Walk 150 feet activity did not occur: Safety/medical concerns (DOE - gait x 30') Assist level: Touching or steadying assistance (Pt > 75%) Assist level: Touching or steadying assistance (Pt > 75%) (without AD)  Function - Comprehension Comprehension: Auditory Comprehension assist level: Understands basic 50 - 74% of the time/ requires cueing 25 - 49% of the time  Function - Expression Expression: Verbal Expression assist level: Expresses basic 25 - 49% of the time/requires cueing 50 - 75% of the time. Uses single words/gestures.  Function - Social Interaction Social Interaction assist level: Interacts appropriately 50 - 74% of the time - May be physically or verbally inappropriate.  Function -  Problem Solving Problem solving assist level: Solves basic 50 - 74% of the time/requires cueing 25 - 49% of the time  Function - Memory Memory assist level: Recognizes or recalls 25 - 49% of the time/requires cueing 50 - 75% of the time Patient normally able to recall (first 3 days only): Location of own room  Medical Problem List and Plan: 1.  Right sided weakness and dysarthria secondary to left corona radiata infarct as well as remote right MCA territory infarct- ,stable for [facility] on [facility]    2.  DVT Prophylaxis/Anticoagulation: Subcutaneous Lovenox. Monitor platelet counts and any signs of bleeding, Cont PT, OT SLP 3. Pain Management: Tylenol as needed 4. Mood/bipolar disorder. No present medications.also suspect baseline dementia will get Neuropsych eval 5. Neuropsych: This patient is capable of making decisions on his own behalf. 6. Skin/Wound Care: Routine skin checks 7. Fluids/Electrolytes/Nutrition: Routine I&O , CMET with mild hypoalbuminemia,add prostat, 20-25%% intake, Encouraged sister to assist with encouragement          8. Hypertension.increased calan SR to '360mg'$   but BP running on low  side, reported to have one episode of orthostasis,  reduced Calan again to '240mg'$  but now Lisinopril d/ced and am BP up, IM to address Filed Vitals:   11/25/15 2112 11/26/15 0549  BP: 166/69 145/83  Pulse:  88  Temp:  98.6 F (37 C)  Resp:  19   9. Hyperlipidemia. Lipitor 10. Remote history of cocaine abuse. Urine drug screen negative.   11.  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

## 2015-11-26 NOTE — Consult Note (Signed)
PSYCHODIAGNOSTIC EVALUATION - [facility]   Dominic Pineda is a 65 year old man, who was seen for an initial diagnostic evaluation post-right MCA CVA.  According to staff members, he had declined in his expressive speech ability since arriving on the unit and there was a question about whether or not he may have underlying dementia.    During the current session, Dominic Pineda mostly communicated by using grunts, but occasional words were audible.  He was able to indicate that despite his increased difficulty communicating, he had not experienced sadness or depression.  He denied having any complaints at this time, other than a sore throat.    Cognitively, Dominic Pineda was oriented to location, but answered "17" for all questions about date (e.g. month, year, day of week, etc.).  He was able to say that he was in the hospital after having a stroke, but was inaccurate in reporting how long he had been there.  He was able to identify family members that had been to visit and the plan for his discharge.  Subjectively, he acknowledged feeling easily distracted, but denied memory problems.    Time was spent during today's session providing psychoeducation to his sister.  She wondered whether his speech difficulties were related to blood pressure medication or possible urinary tract infection.  She also commented that she feels comfortable caring for him post-discharge, but said that if he is accepted into a [place] facility, he will go there, as that is the wish of his daughters.    IMPRESSION:  Dominic Pineda was somewhat oriented and could identify certain aspects of his medical situation correctly, but was off on other details.  He is likely experiencing significant cognitive disruption, at the level of a Major Neurocognitive Disorder (i.e. dementia).  Since much, if not all, of his cognitive disruption is likely secondary to stroke, he may experience cognitive recovery over  time.  However, given the inaccuracies in certain of his statements, it is likely that Dominic Pineda remains capable of deciding who should make detailed medical and financial decisions for him, but he is likely unable to make complex decisions regarding his care independently.  From an emotional standpoint, there was no evidence of clinically significant depression or anxiety at this time.  Owing to his complaint of a sore throat, his providers may want to check for any newly developed medical conditions that could explain that symptom and which may require treatment intervention.    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  Clinical Neuropsychologist

## 2015-11-27 ENCOUNTER — Inpatient Hospital Stay (HOSPITAL_COMMUNITY): Payer: No Typology Code available for payment source | Admitting: Occupational Therapy

## 2015-11-27 NOTE — Progress Notes (Signed)
Subjective/Complaints: Appreciate Internal med note ROS-somnolent,Y/N responses Objective: Vital Signs: Blood pressure 147/61, pulse 72, temperature 98.1 F (36.7 C), temperature source Oral, resp. rate 18, height '5\' 10"'$  (1.778 m), weight 125.193 kg (276 lb), SpO2 95 %. Dg Chest Port 1 View  11/25/2015  CLINICAL DATA:  Altered mental status EXAM: PORTABLE CHEST 1 VIEW COMPARISON:  11/21/2015 FINDINGS: Cardiomegaly. Very low lung volumes with bibasilar atelectasis and vascular congestion. No effusions or acute bony abnormality. IMPRESSION: Very low lung volumes with bibasilar atelectasis. Cardiomegaly with vascular congestion. Electronically Signed   By: Rolm Baptise M.D.   On: 11/25/2015 11:54   Results for orders placed or performed during the hospital encounter of 11/15/15 (from the past 72 hour(s))  Glucose, capillary     Status: Abnormal   Collection Time: 11/25/15 10:44 AM  Result Value Ref Range   Glucose-Capillary 100 (H) 65 - 99 mg/dL   Comment 1 Notify RN   Blood gas, arterial     Status: Abnormal   Collection Time: 11/25/15 11:20 AM  Result Value Ref Range   FIO2 0.21    pH, Arterial 7.414 7.350 - 7.450   pCO2 arterial 35.2 35.0 - 45.0 mmHg   pO2, Arterial 71.3 (L) 80.0 - 100.0 mmHg   Bicarbonate 22.1 20.0 - 24.0 mEq/L   TCO2 23.2 0 - 100 mmol/L   Acid-base deficit 1.8 0.0 - 2.0 mmol/L   O2 Saturation 94.0 %   Patient temperature 98.6    Collection site LEFT RADIAL    Drawn by [id number]    Sample type ARTERIAL DRAW    Allens test (pass/fail) PASS PASS  CBC     Status: Abnormal   Collection Time: 11/25/15 11:59 AM  Result Value Ref Range   WBC 11.1 (H) 4.0 - 10.5 K/uL   RBC 4.69 4.22 - 5.81 MIL/uL   Hemoglobin 13.5 13.0 - 17.0 g/dL   HCT 41.1 39.0 - 52.0 %   MCV 87.6 78.0 - 100.0 fL   MCH 28.8 26.0 - 34.0 pg   MCHC 32.8 30.0 - 36.0 g/dL   RDW 14.0 11.5 - 15.5 %   Platelets 222 150 - 400 K/uL  Comprehensive metabolic panel     Status: Abnormal   Collection Time:  11/25/15 11:59 AM  Result Value Ref Range   Sodium 140 135 - 145 mmol/L   Potassium 4.0 3.5 - 5.1 mmol/L   Chloride 110 101 - 111 mmol/L   CO2 19 (L) 22 - 32 mmol/L   Glucose, Bld 99 65 - 99 mg/dL   BUN 20 6 - 20 mg/dL   Creatinine, Ser 1.23 0.61 - 1.24 mg/dL   Calcium 9.0 8.9 - 10.3 mg/dL   Total Protein 7.0 6.5 - 8.1 g/dL   Albumin 3.6 3.5 - 5.0 g/dL   AST 13 (L) 15 - 41 U/L   ALT 14 (L) 17 - 63 U/L   Alkaline Phosphatase 90 38 - 126 U/L   Total Bilirubin 1.4 (H) 0.3 - 1.2 mg/dL   GFR calc non Af Amer 59 (L) >60 mL/min   GFR calc Af Amer >60 >60 mL/min    Comment: (NOTE) The eGFR has been calculated using the CKD EPI equation. This calculation has not been validated in all clinical situations. eGFR's persistently <60 mL/min signify possible Chronic Kidney Disease.    Anion gap 11 5 - 15  Troponin I     Status: None   Collection Time: 11/25/15 11:59 AM  Result Value Ref  Range   Troponin I <0.03 <0.031 ng/mL    Comment:        NO INDICATION OF MYOCARDIAL INJURY.   Brain natriuretic peptide     Status: None   Collection Time: 11/25/15 11:59 AM  Result Value Ref Range   B Natriuretic Peptide 30.7 0.0 - 100.0 pg/mL  Urinalysis, Routine w reflex microscopic (not at [facility]     Status: Abnormal   Collection Time: 11/25/15 10:24 PM  Result Value Ref Range   Color, Urine AMBER (A) YELLOW    Comment: BIOCHEMICALS MAY BE AFFECTED BY COLOR   APPearance CLEAR CLEAR   Specific Gravity, Urine 1.025 1.005 - 1.030   pH 6.0 5.0 - 8.0   Glucose, UA NEGATIVE NEGATIVE mg/dL   Hgb urine dipstick NEGATIVE NEGATIVE   Bilirubin Urine SMALL (A) NEGATIVE   Ketones, ur 15 (A) NEGATIVE mg/dL   Protein, ur NEGATIVE NEGATIVE mg/dL   Nitrite NEGATIVE NEGATIVE   Leukocytes, UA NEGATIVE NEGATIVE    Comment: MICROSCOPIC NOT DONE ON URINES WITH NEGATIVE PROTEIN, BLOOD, LEUKOCYTES, NITRITE, OR GLUCOSE <1000 mg/dL.     HEENT: normal Cardio: RRR Resp: CTA B/L and unlabored GI: BS positive and NT,  ND Extremity:  No Edema Skin:   Intact, angioedam of bottom lip not tongue   Neuro: Lethargic, Normal Sensory, Abnormal Motor right Delt bi tri grip 4/5, R HF 4/5 R ADF 4/5 , left side 5/5, normal tone and Abnormal FMC Ataxic/ dec FMC,dysarthric very poor speech intelligibility Musc/Skel:  Other no pain with UE or LE ROM Gen NAD   Assessment/Plan: 1. 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 Physiatrist is providing close team supervision and 24 hour management of active medical problems listed below. Physiatrist and rehab team continue to assess barriers to discharge/monitor patient progress toward functional and medical goals. FIM: Function - Bathing Position: Shower Body parts bathed by patient: Right arm, Chest, Left arm, Abdomen, Front perineal area, Right upper leg, Left upper leg Body parts bathed by helper: Left upper leg, Right lower leg, Left lower leg, Back, Buttocks Bathing not applicable: Left upper leg, Right upper leg, Buttocks, Front perineal area (washed earlier by nursing) Assist Level:  (Mod Assist)  Function- Upper Body Dressing/Undressing What is the patient wearing?: Pull over shirt/dress Pull over shirt/dress - Perfomed by patient: Thread/unthread right sleeve, Thread/unthread left sleeve, Put head through opening, Pull shirt over trunk Pull over shirt/dress - Perfomed by helper: Pull shirt over trunk Assist Level: Set up, Supervision or verbal cues Set up : To obtain clothing/put away Function - Lower Body Dressing/Undressing What is the patient wearing?: Underwear, Pants, Shoes Position: Sitting EOB Underwear - Performed by patient: Pull underwear up/down Underwear - Performed by helper: Thread/unthread right underwear leg, Thread/unthread left underwear leg Pants- Performed by patient: Pull pants up/down Pants-  Performed by helper: Thread/unthread right pants leg, Thread/unthread left pants leg Non-skid slipper socks- Performed by helper: Don/doff right sock, Don/doff left sock Shoes - Performed by patient: Don/doff right shoe, Don/doff left shoe Shoes - Performed by helper: Don/doff right shoe, Don/doff left shoe Assist for footwear: Dependant Assist for lower body dressing: Touching or steadying assistance (Pt > 75%)  Function - Toileting Toileting activity did not occur: Refused Toileting steps completed by patient: Adjust clothing prior to toileting, Performs perineal hygiene, Adjust clothing after toileting Toileting steps completed by helper: Adjust clothing prior  to toileting, Performs perineal hygiene, Adjust clothing after toileting Toileting Assistive Devices: Grab bar or rail Assist level: Supervision or verbal cues  Function - Air cabin crew transfer activity did not occur: Refused Toilet transfer assistive device: Bedside commode, Grab bar, Walker Assist level to toilet: Supervision or verbal cues Assist level from toilet: Supervision or verbal cues  Function - Chair/bed transfer Chair/bed transfer method: Stand pivot Chair/bed transfer assist level: Touching or steadying assistance (Pt > 75%) Chair/bed transfer assistive device: Bedrails, Armrests, Walker Chair/bed transfer details: Tactile cues for initiation, Manual facilitation for weight shifting, Visual cues for safe use of DME/AE  Function - Locomotion: Wheelchair Will patient use wheelchair at discharge?: No Type: Manual Max wheelchair distance: 50 Assist Level: Touching or steadying assistance (Pt > 75%) Wheel 150 feet activity did not occur: Safety/medical concerns (DOE- propel x 50') Assist Level: Touching or steadying assistance (Pt > 75%) Turns around,maneuvers to table,bed, and toilet,negotiates 3% grade,maneuvers on rugs and over doorsills: No Function - Locomotion: Ambulation Assistive device:  Walker-rolling Max distance: 150 Assist level: Touching or steadying assistance (Pt > 75%) Assist level: Touching or steadying assistance (Pt > 75%) Walk 50 feet with 2 turns activity did not occur: Safety/medical concerns (DOE on exertion ; gait x 30') Assist level: Touching or steadying assistance (Pt > 75%) Walk 150 feet activity did not occur: Safety/medical concerns (DOE - gait x 30') Assist level: Touching or steadying assistance (Pt > 75%) Assist level: Touching or steadying assistance (Pt > 75%) (without AD)  Function - Comprehension Comprehension: Auditory Comprehension assist level: Understands basic 50 - 74% of the time/ requires cueing 25 - 49% of the time  Function - Expression Expression: Verbal Expression assist level: Expresses basic 25 - 49% of the time/requires cueing 50 - 75% of the time. Uses single words/gestures.  Function - Social Interaction Social Interaction assist level: Interacts appropriately 50 - 74% of the time - May be physically or verbally inappropriate.  Function - Problem Solving Problem solving assist level: Solves basic 50 - 74% of the time/requires cueing 25 - 49% of the time  Function - Memory Memory assist level: Recognizes or recalls 25 - 49% of the time/requires cueing 50 - 75% of the time Patient normally able to recall (first 3 days only): Location of own room  Medical Problem List and Plan: 1.  Right sided weakness and dysarthria secondary to left corona radiata infarct as well as remote right MCA territory infarct- ,[facility] on [facility]    2.  DVT Prophylaxis/Anticoagulation: Subcutaneous Lovenox. Monitor platelet counts and any signs of bleeding, 3. Pain Management: Tylenol as needed 4. Mood/bipolar disorder. No present medications.also suspect baseline dementia will get Neuropsych eval 5. Neuropsych: This patient is capable of making decisions on his own behalf. 6. Skin/Wound Care: Routine skin checks 7.  Fluids/Electrolytes/Nutrition: Routine I&O , CMET with mild hypoalbuminemia,add prostat, 20-50%% intake, 377m                         8. Hypertension.,  reduced Calan again to '240mg'$ , off lisinopril  Filed Vitals:   11/26/15 1900 11/27/15 0527  BP: 127/62 147/61  Pulse: 80 72  Temp:  98.1 F (36.7 C)  Resp:  18   9. Hyperlipidemia. Lipitor 10. Remote history of cocaine abuse. Urine drug screen negative.   11.  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

## 2015-11-27 NOTE — Discharge Summary (Signed)
Discharge summary job # [id number]

## 2015-11-27 NOTE — Discharge Summary (Signed)
Dominic Pineda, Dominic Pineda               ACCOUNT NO.:  [id number]  MEDICAL RECORD NO.:  [id number]  LOCATION:  [id number]                        FACILITY:  [facility]  PHYSICIAN:  Dominic Pineda, M.D.DATE OF BIRTH:  February 18, 1951  DATE OF ADMISSION:  11/15/2015 DATE OF DISCHARGE:  11/28/2015                              DISCHARGE SUMMARY   DISCHARGE DIAGNOSES: 1. Left corona radiata infarct as well as remote right middle cerebral     artery territory infarct. 2. Subcutaneous Lovenox for deep vein thrombosis prophylaxis. 3. Hyperlipidemia. 4. Remote history of cocaine abuse. 5. Bowel and bladder incontinence. 6. Bipolar disorder.  HISTORY OF PRESENT ILLNESS:  This is a 65 year old right-handed male, with history of documented bipolar disorder, hypertension and CVA in 2009, maintained on aspirin as well as cocaine abuse in the past.  He lives with his sister in a one-level home and used a cane prior to admission.  Presented November 11, 2015, with new onset of slurred speech, right-sided weakness.  Urine drug screen negative.  MRI of the brain showed acute subcentimeter left corona radiata infarct as well as old right MCA territory infarct.  Old small cerebellar infarcts.  Small area of left occipital lobe encephalomalacia.  Echocardiogram with ejection fraction 65% grade 1 diastolic dysfunction.  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  The patient did not receive tPA.  Maintained on aspirin and Plavix therapy for CVA prophylaxis.  Subcutaneous Lovenox for DVT prophylaxis.  Urinalysis study November 11, 2015, positive nitrite with cultures multiple species, placed empirically on Cipro.  Physical and occupational therapy ongoing.  The patient was admitted for a comprehensive rehab program.  PAST MEDICAL HISTORY:  See discharge diagnoses.  SOCIAL HISTORY:   Lives with sister, independent with a cane prior to admission.  FUNCTIONAL STATUS UPON ADMISSION TO REHAB SERVICES:  Minimal assist 40 feet with inconsistencies, minimal guard sit to stand, min to mod assist activities of daily living.  The patient easily fatigued.  PHYSICAL EXAMINATION:  VITAL SIGNS:  Blood pressure 166/70, pulse 67, temperature 98, respirations 18. GENERAL:  This was an alert male, speech was dysarthric but intelligible.  He needed some encouragement to interact at times. LUNGS:  Clear to auscultation without wheeze. CARDIAC:  Regular rate and rhythm without murmur. ABDOMEN:  Soft, nontender.  Good bowel sounds. EXTREMITIES:  Moving all extremities 4+/5.  Limited awareness of deficits.  REHABILITATION HOSPITAL COURSE:  The patient was admitted to Inpatient Rehab Services with therapies initiated on a 3-hour daily basis, consisting of physical therapy, occupational therapy, speech therapy, and rehabilitation nursing.  The following issues were addressed during the patient's rehabilitation stay.  Pertaining to Mr. Jilani' left corona radiata infarct as well as remote right MCA territory infarct, remained on aspirin and Plavix therapy.  Subcutaneous Lovenox for DVT prophylaxis.  The patient with transient episodes of somnolence, although he was responsive.  CT of the head x2 showed no acute changes. There was moderate amount of atrophy noted.  Urinalysis study was negative.  Cardiac enzymes unremarkable.  Follow up chemistries stable. Internal Medicine was  consulted for followup medically.  Continued on Lipitor for hyperlipidemia.  Verapamil for hypertension.  Documented history of bipolar disorder, discussed at length with his sister.  Home medications were reviewed.  The patient on no antipsychotic medications prior to admission.  The patient received weekly collaborative interdisciplinary team conferences to discuss estimated length of stay, family teaching, any  barriers to his discharge.  The patient demonstrated limited progress toward supervision goals due to impaired cognition, initiation, attention to task.  He can perform functional ability as well with little assistance is minimal assist but again increased variables.  Berg Balance Score 29/56.  He needed some assistance for bathing and dressing, again cues were needed.  Decreased initiation again with tasks.  Due to these limited advances, a long discussion with his sister and family was felt skilled nursing facility was needed.  There was a bed available through the [facility] with plan for discharge on Nov 28, 2015,  DISCHARGE MEDICATIONS: 1. Aspirin 81 mg p.o. daily. 2. Lipitor 40 mg p.o. daily. 3. Vitamin D 2000 units p.o. daily. 4. Plavix 75 mg p.o. daily. 5. Verapamil 240 mg p.o. at bedtime.  DIET:  Dysphagia #1 thin liquid diet.  FOLLOWUP:  The patient would follow up Dr. Claudette Laws at the outpatient rehab center as directed.     Dominic Pineda, P.A.   ______________________________ Dominic Pineda, M.D.    DA/MEDQ  D:  11/27/2015  T:  11/27/2015  Job:  [id number]  cc:   Pramod P. Pearlean Brownie, MD

## 2015-11-28 ENCOUNTER — Inpatient Hospital Stay (HOSPITAL_COMMUNITY): Payer: No Typology Code available for payment source | Admitting: Occupational Therapy

## 2015-11-28 ENCOUNTER — Inpatient Hospital Stay (HOSPITAL_COMMUNITY): Payer: No Typology Code available for payment source

## 2015-11-28 ENCOUNTER — Telehealth: Payer: Self-pay | Admitting: *Deleted

## 2015-11-28 ENCOUNTER — Inpatient Hospital Stay (HOSPITAL_COMMUNITY): Payer: No Typology Code available for payment source | Admitting: Speech Pathology

## 2015-11-28 MED ORDER — PHENOL 1.4 % MT LIQD: 1.0000 | OROMUCOSAL | Status: DC | PRN

## 2015-11-28 NOTE — Progress Notes (Addendum)
Subjective/Complaints: Pt ate better yesteray ROS-somnolent,Y/N responses Objective: Vital Signs: Blood pressure 158/63, pulse 64, temperature 98.2 F (36.8 C), temperature source Oral, resp. rate 19, height '5\' 10"'$  (1.778 m), weight 125.193 kg (276 lb), SpO2 95 %. No results found. Results for orders placed or performed during the hospital encounter of 11/15/15 (from the past 72 hour(s))  Glucose, capillary     Status: Abnormal   Collection Time: 11/25/15 10:44 AM  Result Value Ref Range   Glucose-Capillary 100 (H) 65 - 99 mg/dL   Comment 1 Notify RN   Blood gas, arterial     Status: Abnormal   Collection Time: 11/25/15 11:20 AM  Result Value Ref Range   FIO2 0.21    pH, Arterial 7.414 7.350 - 7.450   pCO2 arterial 35.2 35.0 - 45.0 mmHg   pO2, Arterial 71.3 (L) 80.0 - 100.0 mmHg   Bicarbonate 22.1 20.0 - 24.0 mEq/L   TCO2 23.2 0 - 100 mmol/L   Acid-base deficit 1.8 0.0 - 2.0 mmol/L   O2 Saturation 94.0 %   Patient temperature 98.6    Collection site LEFT RADIAL    Drawn by [id number]    Sample type ARTERIAL DRAW    Allens test (pass/fail) PASS PASS  CBC     Status: Abnormal   Collection Time: 11/25/15 11:59 AM  Result Value Ref Range   WBC 11.1 (H) 4.0 - 10.5 K/uL   RBC 4.69 4.22 - 5.81 MIL/uL   Hemoglobin 13.5 13.0 - 17.0 g/dL   HCT 41.1 39.0 - 52.0 %   MCV 87.6 78.0 - 100.0 fL   MCH 28.8 26.0 - 34.0 pg   MCHC 32.8 30.0 - 36.0 g/dL   RDW 14.0 11.5 - 15.5 %   Platelets 222 150 - 400 K/uL  Comprehensive metabolic panel     Status: Abnormal   Collection Time: 11/25/15 11:59 AM  Result Value Ref Range   Sodium 140 135 - 145 mmol/L   Potassium 4.0 3.5 - 5.1 mmol/L   Chloride 110 101 - 111 mmol/L   CO2 19 (L) 22 - 32 mmol/L   Glucose, Bld 99 65 - 99 mg/dL   BUN 20 6 - 20 mg/dL   Creatinine, Ser 1.23 0.61 - 1.24 mg/dL   Calcium 9.0 8.9 - 10.3 mg/dL   Total Protein 7.0 6.5 - 8.1 g/dL   Albumin 3.6 3.5 - 5.0 g/dL   AST 13 (L) 15 - 41 U/L   ALT 14 (L) 17 - 63 U/L    Alkaline Phosphatase 90 38 - 126 U/L   Total Bilirubin 1.4 (H) 0.3 - 1.2 mg/dL   GFR calc non Af Amer 59 (L) >60 mL/min   GFR calc Af Amer >60 >60 mL/min    Comment: (NOTE) The eGFR has been calculated using the CKD EPI equation. This calculation has not been validated in all clinical situations. eGFR's persistently <60 mL/min signify possible Chronic Kidney Disease.    Anion gap 11 5 - 15  Troponin I     Status: None   Collection Time: 11/25/15 11:59 AM  Result Value Ref Range   Troponin I <0.03 <0.031 ng/mL    Comment:        NO INDICATION OF MYOCARDIAL INJURY.   Brain natriuretic peptide     Status: None   Collection Time: 11/25/15 11:59 AM  Result Value Ref Range   B Natriuretic Peptide 30.7 0.0 - 100.0 pg/mL  Urinalysis, Routine w reflex microscopic (not at  [facility])     Status: Abnormal   Collection Time: 11/25/15 10:24 PM  Result Value Ref Range   Color, Urine AMBER (A) YELLOW    Comment: BIOCHEMICALS MAY BE AFFECTED BY COLOR   APPearance CLEAR CLEAR   Specific Gravity, Urine 1.025 1.005 - 1.030   pH 6.0 5.0 - 8.0   Glucose, UA NEGATIVE NEGATIVE mg/dL   Hgb urine dipstick NEGATIVE NEGATIVE   Bilirubin Urine SMALL (A) NEGATIVE   Ketones, ur 15 (A) NEGATIVE mg/dL   Protein, ur NEGATIVE NEGATIVE mg/dL   Nitrite NEGATIVE NEGATIVE   Leukocytes, UA NEGATIVE NEGATIVE    Comment: MICROSCOPIC NOT DONE ON URINES WITH NEGATIVE PROTEIN, BLOOD, LEUKOCYTES, NITRITE, OR GLUCOSE <1000 mg/dL.     HEENT: normal Cardio: RRR Resp: CTA B/L and unlabored GI: BS positive and NT, ND Extremity:  No Edema Skin:   Intact, angioedam of bottom lip not tongue   Neuro: Lethargic, Normal Sensory, Abnormal Motor right Delt bi tri grip 4/5, R HF 4/5 R ADF 4/5 , left side 5/5, normal tone and Abnormal FMC Ataxic/ dec FMC,dysarthric very poor speech intelligibility Musc/Skel:  Other no pain with UE or LE ROM Gen NAD   Assessment/Plan: 1. 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 Stable for D/C today F/u MD at [facility]  See D/C summary See D/C instructions FIM: Function - Bathing Position: Bed Body parts bathed by patient: Right arm, Chest, Left arm, Abdomen, Front perineal area, Right upper leg, Left upper leg Body parts bathed by helper: Right arm, Left arm, Chest, Abdomen, Front perineal area, Buttocks, Right upper leg, Left upper leg, Right lower leg, Left lower leg, Back Bathing not applicable: Left upper leg, Right upper leg, Buttocks, Front perineal area (washed earlier by nursing) Assist Level: 2 helpers  Function- Upper Body Dressing/Undressing What is the patient wearing?: Pull over shirt/dress Pull over shirt/dress - Perfomed by patient: Thread/unthread right sleeve, Thread/unthread left sleeve, Put head through opening, Pull shirt over trunk Pull over shirt/dress - Perfomed by helper: Pull shirt over trunk Assist Level: Set up, Supervision or verbal cues Set up : To obtain clothing/put away Function - Lower Body Dressing/Undressing What is the patient wearing?: Underwear, Pants, Shoes Position: Bed Underwear - Performed by patient: Pull underwear up/down Underwear - Performed by helper: Thread/unthread right underwear leg, Thread/unthread left underwear leg Pants- Performed by patient: Thread/unthread right pants leg, Thread/unthread left pants leg, Pull pants up/down Pants- Performed by helper: Thread/unthread right pants leg, Thread/unthread left pants leg, Pull pants up/down Non-skid slipper socks- Performed by helper: Don/doff right sock, Don/doff left sock Shoes - Performed by patient: Don/doff right shoe, Don/doff left shoe Shoes - Performed by helper: Don/doff right shoe, Don/doff left shoe Assist for footwear: Dependant Assist for lower body dressing: Touching or steadying assistance (Pt > 75%)  Function - Toileting Toileting activity did not occur: Refused Toileting steps completed by patient: Adjust clothing  prior to toileting, Performs perineal hygiene, Adjust clothing after toileting Toileting steps completed by helper: Adjust clothing prior to toileting, Performs perineal hygiene, Adjust clothing after toileting Toileting Assistive Devices: Grab bar or rail Assist level: Supervision or verbal cues  Function - Air cabin crew transfer activity did not occur: Risk analyst transfer assistive device: Bedside commode, Grab bar, Walker Assist level to toilet: Supervision or verbal cues Assist level from toilet: Supervision or verbal cues  Function - Chair/bed transfer Chair/bed transfer method: Stand pivot Chair/bed transfer assist level: Touching or steadying assistance (  Pt > 75%) Chair/bed transfer assistive device: Bedrails, Armrests, Walker Chair/bed transfer details: Tactile cues for initiation, Manual facilitation for weight shifting, Visual cues for safe use of DME/AE  Function - Locomotion: Wheelchair Will patient use wheelchair at discharge?: No Type: Manual Max wheelchair distance: 50 Assist Level: Touching or steadying assistance (Pt > 75%) Wheel 150 feet activity did not occur: Safety/medical concerns (DOE- propel x 50') Assist Level: Touching or steadying assistance (Pt > 75%) Turns around,maneuvers to table,bed, and toilet,negotiates 3% grade,maneuvers on rugs and over doorsills: No Function - Locomotion: Ambulation Assistive device: Walker-rolling Max distance: 150 Assist level: Touching or steadying assistance (Pt > 75%) Assist level: Touching or steadying assistance (Pt > 75%) Walk 50 feet with 2 turns activity did not occur: Safety/medical concerns (DOE on exertion ; gait x 30') Assist level: Touching or steadying assistance (Pt > 75%) Walk 150 feet activity did not occur: Safety/medical concerns (DOE - gait x 30') Assist level: Touching or steadying assistance (Pt > 75%) Assist level: Touching or steadying assistance (Pt > 75%) (without AD)  Function -  Comprehension Comprehension: Auditory Comprehension assist level: Understands basic 50 - 74% of the time/ requires cueing 25 - 49% of the time  Function - Expression Expression: Verbal Expression assist level: Expresses basic 25 - 49% of the time/requires cueing 50 - 75% of the time. Uses single words/gestures.  Function - Social Interaction Social Interaction assist level: Interacts appropriately 50 - 74% of the time - May be physically or verbally inappropriate.  Function - Problem Solving Problem solving assist level: Solves basic 50 - 74% of the time/requires cueing 25 - 49% of the time  Function - Memory Memory assist level: Recognizes or recalls 25 - 49% of the time/requires cueing 50 - 75% of the time, Recognizes or recalls less than 25% of the time/requires cueing greater than 75% of the time Patient normally able to recall (first 3 days only): Location of own room  Medical Problem List and Plan: 1.  Right sided weakness and dysarthria secondary to left corona radiata infarct as well as remote right MCA territory infarct- ,stable for [facility] placement for short term SNF     2.  DVT Prophylaxis/Anticoagulation: Subcutaneous Lovenox. Monitor platelet counts and any signs of bleeding, 3. Pain Management: Tylenol as needed 4. Mood/bipolar disorder. No present medications.also suspect baseline dementia will get Neuropsych eval 5. Neuropsych: This patient is capable of making decisions on his own behalf. 6. Skin/Wound Care: Routine skin checks 7. Fluids/Electrolytes/Nutrition: Routine I&O , CMET with mild hypoalbuminemia,add prostat, 50-80%% intake, 353m                         8. Hypertension.,  reduced Calan again to '240mg'$ , off lisinopril  Filed Vitals:   11/27/15 1953 11/28/15 0513  BP: 152/66 158/63  Pulse: 74 64  Temp:  98.2 F (36.8 C)  Resp:  19   9. Hyperlipidemia. Lipitor 10. Remote history of cocaine abuse. Urine drug screen negative.   11.  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

## 2015-11-28 NOTE — Progress Notes (Signed)
Pt discharged to [facility]. Belongings with pt and sister.

## 2015-11-28 NOTE — Progress Notes (Signed)
Physical Therapy Discharge Summary  Patient Details  Name: Dominic Pineda MRN: [id number] Date of Birth: [birth year]  Patient has met 5 of 6 long term goals due to improved activity tolerance. LTGs were downgraded due to pt's lethargy, poor initiation and participation.  Patient to discharge at an ambulatory level min assist to +2 assist.   Patient's care partner unavailable to provide the necessary physical and cognitive assistance at discharge.  Reasons goals not met: cognitive deficits; poor initiation and participation. Pt unwilling to participate in [place] during last few PT sessions.   Recommendation:  Patient will benefit from ongoing skilled PT services in skilled nursing facility setting to continue to advance safe functional mobility, address ongoing impairments in activity tolerance, balance, gait, cognition, flexibility,  and minimize fall risk.  Equipment: No equipment provided  Reasons for discharge: treatment goals met and discharge from hospital  Patient/family agrees with progress made and goals achieved: Yes  PT Discharge Precautions/Restrictions Precautions Precautions: Fall Restrictions Weight Bearing Restrictions: No  Pain Pain Assessment Pain Assessment: No/denies pain Vision/Perception - wears reading glasses; vision unchanged    Cognition Orientation Level: Oriented to person;Oriented to place;Oriented to situation (extra time-cues at times) Sensation Sensation Light Touch: Appears Intact Proprioception: Appears Intact Coordination Gross Motor Movements are Fluid and Coordinated: No Fine Motor Movements are Fluid and Coordinated: No Heel Shin Test: difficult due to bil hip ER contractures; limited excursion, speed and coordination Motor  Motor Motor: Abnormal postural alignment and control Motor - Skilled Clinical Observations: increased waist crease R  Motor - Discharge Observations: increased waist crease R  Mobility Bed Mobility Bed Mobility:  Rolling Right;Rolling Left;Right Sidelying to Sit Rolling Right: 6: Modified independent (Device/Increase time) Rolling Left: 6: Modified independent (Device/Increase time) Right Sidelying to Sit: 3: Mod assist Right Sidelying to Sit Details: Manual facilitation for weight shifting;Verbal cues for technique Supine to Sit: 3: Mod assist Supine to Sit Details: Manual facilitation for placement Transfers Transfers: Yes Stand Pivot Transfers: Other (comment) (variable from min assist to +2 due to cognitive deficts) Stand Pivot Transfer Details: Verbal cues for sequencing;Verbal cues for precautions/safety;Verbal cues for technique;Manual facilitation for placement Locomotion  Ambulation Ambulation: Yes Ambulation/Gait Assistance: 4: Min assist (when willing to initiate) Ambulation Distance (Feet): 150 Feet (variable due to cognition, fatigue) Assistive device: None Ambulation/Gait Assistance Details: Verbal cues for precautions/safety Gait Gait: Yes Gait Pattern: Impaired Gait Pattern: Wide base of support;Decreased trunk rotation;Decreased hip/knee flexion - right;Decreased hip/knee flexion - left;Step-through pattern Stairs / Additional Locomotion Stairs: Yes (when motivated) Stairs Assistance: 4: Min Estate agent Assistance Details: Verbal cues for gait pattern;Verbal cues for technique;Verbal cues for precautions/safety Stair Management Technique: Two rails Number of Stairs: 12 Height of Stairs: 6.5 Ramp: 4: Min assist Curb: 4: Min Technical sales engineer Mobility: No  Trunk/Postural Assessment  Cervical Assessment Cervical Assessment: Exceptions to [facility] (limited PROM in all directions) Thoracic Assessment Thoracic Assessment: Within Functional Limits Lumbar Assessment Lumbar Assessment: Exceptions to [facility] (maintains posterior pelvic tilt, and increased wt bearing R hip) Postural Control Postural Control: Within Functional Limits  Balance Standardized Balance  Assessment Standardized Balance Assessment: Berg Balance Test (performed 11/18/15) Sharlene Motts Balance Test Sit to Stand: Able to stand  independently using hands Standing Unsupported: Able to stand safely 2 minutes Sitting with Back Unsupported but Feet Supported on Floor or Stool: Able to sit safely and securely 2 minutes Stand to Sit: Controls descent by using hands Transfers: Able to transfer safely, definite need of hands Standing Unsupported with Eyes  Closed: Able to stand 10 seconds safely Standing Ubsupported with Feet Together: Needs help to attain position but able to stand for 30 seconds with feet together From Standing, Reach Forward with Outstretched Arm: Can reach forward >5 cm safely (2") From Standing Position, Pick up Object from Floor: Able to pick up shoe, needs supervision From Standing Position, Turn to Look Behind Over each Shoulder: Needs supervision when turning (only turns head & does not shift weight) Turn 360 Degrees: Needs assistance while turning (unable to complete full 360 degree turn even with maximum verbal cuing; otherwise pt able to initiate turns with supervision) Standing Unsupported, Alternately Place Feet on Step/Stool: Needs assistance to keep from falling or unable to try (pt refused to try even with encouragement from PT) Standing Unsupported, One Foot in Front: Loses balance while stepping or standing (unable to take step forward & have one foot completely in front of other; unable to hold position for 5 seconds) Standing on One Leg: Tries to lift leg/unable to hold 3 seconds but remains standing independently Total Score: 29 Static Sitting Balance Static Sitting - Balance Support: No upper extremity supported Static Sitting - Level of Assistance: 7: Independent Dynamic Sitting Balance Dynamic Sitting - Balance Support: Feet supported Dynamic Sitting - Level of Assistance: 5: Stand by assistance Static Standing Balance Static Standing - Balance Support: No  upper extremity supported Static Standing - Level of Assistance: Other (comment) (variable min> +2 due to cognitive deficits) Dynamic Standing Balance Dynamic Standing - Balance Support: No upper extremity supported Dynamic Standing - Level of Assistance: Other (comment) (variable min assist > +2 due to cognitive deficits) Extremity Assessment      RLE Assessment RLE Assessment: Within Functional Limits (not tested at d/c; at admission: strength difficult to assess/WFLS; ankle DF 0 degrees, hamstrings and hip ER tight) LLE Assessment LLE Assessment: Within Functional Limits (not tested at d/c; at admission: strength difficult to assess/WFL; ankle DF 0 degrees; hamstrings  and hip ER tight)   See Function Navigator for Current Functional Status.  Eveny Anastas 11/28/2015, 10:28 AM

## 2015-11-28 NOTE — Telephone Encounter (Signed)
APPT. REMINDER CALL, NO ANSWER °

## 2015-11-28 NOTE — Progress Notes (Signed)
Occupational Therapy Discharge Summary  Patient Details  Name: Dominic Pineda MRN: [id number] Date of Birth: February 08, 1951  Patient has met 8 of 10 long term goals due to postural control and ability to compensate for deficits.  Patient to discharge at overall Min-Mod Assist level for BADL with extra time to allow pt participation.  Patient's care partner is unable to provide the necessary physical and cognitive assistance at discharge; pt to discharge to [facility] home unit for continued care/rehab.    Reasons goals not met: Pt with poor communication skills (comprehension and expression) and attention requires assistance and continuous cues to complete self-care with adequate thoroughness.   Physical assistance is also rejected by pt who demonstrates ability to perform task but is unable to self-monitor and correct as necessary d/t impaired cognition.   Recommendation:  Patient will benefit from ongoing skilled OT services in skilled nursing facility setting to continue to advance functional skills in the area of Reduce care partner burden.  Equipment: No equipment provided  Reasons for discharge: discharge from hospital  Patient/family agrees with progress made and goals achieved: Yes  OT Discharge Precautions/Restrictions  Precautions Precautions: Fall Restrictions Weight Bearing Restrictions: No  ADL ADL ADL Comments: See Functional Assessment Tool  Vision/Perception  Vision- History Baseline Vision/History: Wears glasses Wears Glasses: Reading only Patient Visual Report: No change from baseline   Cognition Overall Cognitive Status: History of cognitive impairments - at baseline Orientation Level: Oriented to person;Oriented to place;Oriented to situation Attention: Sustained Sustained Attention: Impaired Sustained Attention Impairment: Verbal basic;Functional basic Memory: Impaired Memory Impairment: Decreased recall of new information Awareness Impairment:  Intellectual impairment Problem Solving: Impaired Problem Solving Impairment: Functional basic;Verbal complex Executive Function: Organizing;Decision Making;Initiating;Self Monitoring;Self Correcting Sequencing: Appears intact Organizing: Impaired Organizing Impairment: Functional basic;Verbal basic Decision Making: Impaired Decision Making Impairment: Verbal basic;Functional basic Initiating: Impaired Initiating Impairment: Verbal complex;Functional basic Self Monitoring: Impaired Self Monitoring Impairment: Verbal basic;Functional basic Self Correcting: Impaired Self Correcting Impairment: Verbal basic;Functional basic Behaviors: Impulsive;Poor frustration tolerance Safety/Judgment: Impaired Comments: Pt noted with decline in communication skills d/t dysarthria with drooling.  Sensation Sensation Light Touch: Appears Intact Proprioception: Appears Intact Coordination Gross Motor Movements are Fluid and Coordinated: No Fine Motor Movements are Fluid and Coordinated: No Heel Shin Test: difficult due to bil hip ER contractures; limited excursion, speed and coordination  Motor  Motor Motor: Abnormal postural alignment and control Motor - Skilled Clinical Observations: increased waist crease R  Motor - Discharge Observations: increased waist crease R  Mobility  Bed Mobility Bed Mobility: Rolling Right;Rolling Left;Right Sidelying to Sit Rolling Right: 6: Modified independent (Device/Increase time) Rolling Left: 6: Modified independent (Device/Increase time) Right Sidelying to Sit: 3: Mod assist Right Sidelying to Sit Details: Manual facilitation for weight shifting;Verbal cues for technique Supine to Sit: 3: Mod assist Supine to Sit Details: Manual facilitation for placement   Trunk/Postural Assessment  Cervical Assessment Cervical Assessment: Exceptions to [facility] (limited PROM in all directions) Thoracic Assessment Thoracic Assessment: Within Functional Limits Lumbar  Assessment Lumbar Assessment: Exceptions to [facility] (maintains posterior pelvic tilt, and increased wt bearing R hip) Postural Control Postural Control: Within Functional Limits   Balance Standardized Balance Assessment Standardized Balance Assessment: Berg Balance Test (performed 11/18/15) Merrilee Jansky Balance Test Sit to Stand: Able to stand  independently using hands Standing Unsupported: Able to stand safely 2 minutes Sitting with Back Unsupported but Feet Supported on Floor or Stool: Able to sit safely and securely 2 minutes Stand to Sit: Controls descent by using hands Transfers:  Able to transfer safely, definite need of hands Standing Unsupported with Eyes Closed: Able to stand 10 seconds safely Standing Ubsupported with Feet Together: Needs help to attain position but able to stand for 30 seconds with feet together From Standing, Reach Forward with Outstretched Arm: Can reach forward >5 cm safely (2") From Standing Position, Pick up Object from Floor: Able to pick up shoe, needs supervision From Standing Position, Turn to Look Behind Over each Shoulder: Needs supervision when turning (only turns head & does not shift weight) Turn 360 Degrees: Needs assistance while turning (unable to complete full 360 degree turn even with maximum verbal cuing; otherwise pt able to initiate turns with supervision) Standing Unsupported, Alternately Place Feet on Step/Stool: Needs assistance to keep from falling or unable to try (pt refused to try even with encouragement from PT) Standing Unsupported, One Foot in Front: Loses balance while stepping or standing (unable to take step forward & have one foot completely in front of other; unable to hold position for 5 seconds) Standing on One Leg: Tries to lift leg/unable to hold 3 seconds but remains standing independently Total Score: 29 Static Sitting Balance Static Sitting - Balance Support: No upper extremity supported Static Sitting - Level of Assistance: 7:  Independent Dynamic Sitting Balance Dynamic Sitting - Balance Support: Feet supported Dynamic Sitting - Level of Assistance: 5: Stand by assistance Static Standing Balance Static Standing - Balance Support: No upper extremity supported Static Standing - Level of Assistance: Other (comment) (variable min> +2 due to cognitive deficits) Dynamic Standing Balance Dynamic Standing - Balance Support: No upper extremity supported Dynamic Standing - Level of Assistance: Other (comment) (variable min assist > +2 due to cognitive deficits)  Extremity/Trunk Assessment RUE Assessment RUE Assessment: Within Functional Limits LUE Assessment LUE Assessment: Within Functional Limits   See Function Navigator for Current Functional Status.  [facility] 11/28/2015, 2:39 PM

## 2015-11-28 NOTE — Progress Notes (Signed)
Social Work Discharge Note  The overall goal for the admission was met for:   Discharge location: No - pt changed to SNF transfer   Length of Stay: Yes - 13 days  Discharge activity level: No - minimal assistance needed - did not reach supervision level goals  Home/community participation: No  Services provided included: MD, RD, PT, OT, SLP, RN, Pharmacy, [facility] [place]: Private Insurance: [place] [employer]  Follow-up services arranged: Other: Pt transferred to [facility] to continue his rehab.  Comments (or additional information): Pt's dtrs were concerned about pt returning home, that he would require more care than the sister can provide, especially with her upcoming surgery.  Pt to go to the [facility] for Rehab and then family will decide if he can go home or if he is going to stay at the [facility].  Sister is preparing the house for his return, in hopes that he can come home after Rehab.  Dtr asked about pt doing "paperwork" prior to d/c and had a notary public to bring here to hospital to sign something.  CSW explained that pt's ambulance transport was due to arrive anytime and that she would have to work with the [facility] staff about that at this point.  She expressed understanding.  [facility] ambulance service arranged by the [facility] and CSW provided them with documentation for transport.  CIR RN called report to RN at [facility] in [place].  Sister was appreciative of work Biochemist, clinical did on behalf of pt.  Pt was pleased to be leaving.  Patient/Family verbalized understanding of follow-up arrangements: Yes  Individual responsible for coordination of the follow-up plan: Pt's family - two dtrs and sister  Confirmed correct DME delivered: Trey Sailors 11/28/2015    Dusten Ellinwood, Silvestre Mesi

## 2015-11-29 ENCOUNTER — Ambulatory Visit: Payer: No Typology Code available for payment source | Admitting: Internal Medicine

## 2015-11-29 NOTE — Plan of Care (Signed)
Problem: RH Swallowing Goal: LTG Patient will consume least restrictive PO diet (SLP) LTG: Patient will consume least restrictive PO diet with assist for use of compensatory strategies (SLP)  Outcome: Not Met (add Reason) Max assist needed  Goal: LTG Patient will participate in dysphagia therapy (SLP) LTG: Patient will participate in dysphagia therapy with assist to increase swallow function as evidenced by bedside or objective clinical assessment (SLP)  Outcome: Not Met (add Reason) Max assist  Goal: LTG Patient will demonstrate a functional change in (SLP) LTG: Patient will demonstrate a functional change in oral/oropharyngeal swallow as evidenced by an objective assessment (SLP)  Pt remains on dys 1, thin liquids   Problem: RH Expression Communication Goal: LTG Patient will increase speech intelligibility (SLP) LTG: Patient will increase speech intelligibility at word/phrase/conversation level with cues, % of the time (SLP)  Outcome: Not Met (add Reason) Max assist

## 2015-11-29 NOTE — Progress Notes (Signed)
Speech Language Pathology Discharge Summary  Patient Details  Name: JOBIN MONTELONGO MRN: [id number] Date of Birth: 27-Jan-1951   Patient has met 4 of 8 long term goals.  Patient to discharge at overall Mod;Max level.  Reasons goals not met: pt requires max assist for use of swallowing precautions and intelligibility at the word level    Clinical Impression/Discharge Summary:  Pt made inconsistent progress while inpatient and is discharging having met 4 out of 8 long term goals.  Pt currently requires mod assist during basic, familiar cognitive tasks due to decreased initiation and poor sustained attention to tasks.  Pt requires max assist cues for use of swallowing precautions to maintain safety while consuming dys 1 textures and thin liquids.  Pt also requires max assist cues for intelligibility at the word level.  Pt's dysphagia and dysarthria are newer in onset since being admitted to CIR and without evidence of neurological or medical etiology other than slightly increased angioedema of lips which per MD was an allergic reaction to new medications.  Pt is discharging to SNF where it is recommended that he receive follow up ST services to address communication and dysphagia in order to maximize functional independence and reduce burden of care prior to discharge home.  Suspect that pt is near baseline for cognition given reports from his sister who was caring for him prior to admission.  Pt and family education is complete at this time.   Care Partner:  Caregiver Able to Provide Assistance: Other (comment) (SNF)  Type of Caregiver Assistance:  (SNF)  Recommendation:  24 hour supervision/assistance;Home Health SLP;Skilled Nursing facility  Rationale for SLP Follow Up: Maximize functional communication;Maximize cognitive function and independence;Maximize swallowing safety;Reduce caregiver burden   Equipment: none recommended by SLP    Reasons for discharge: Discharged from hospital    Patient/Family Agrees with Progress Made and Goals Achieved: Yes     Tamira Ryland, Selinda Orion 11/29/2015, 7:58 AM

## 2015-12-02 ENCOUNTER — Telehealth: Payer: Self-pay | Admitting: *Deleted

## 2015-12-02 NOTE — Telephone Encounter (Signed)
Transitional Care follow up  Unable to leave message on [name] voicemail and no answer on his phone.  I have left a message with his daughter about the appt with Dr Wynn BankerKirsteins and the number to call back to the office  1. Are you/is patient experiencing any problems since coming home? Are there any questions regarding any aspect of care? 2. Are there any questions regarding medications administration/dosing? Are meds being taken as prescribed? Patient should review meds with caller to confirm 3. Have there been any falls? 4. Has Home Health been to the house and/or have they contacted you? If not, have you tried to contact them? Can we help you contact them? 5. Are bowels and bladder emptying properly? Are there any unexpected incontinence issues? If applicable, is patient following bowel/bladder programs? 6. Any fevers, problems with breathing, unexpected pain? 7. Are there any skin problems or new areas of breakdown? 8. Has the patient/family member arranged specialty MD follow up (ie cardiology/neurology/renal/surgical/etc)?  Can we help arrange? 9. Does the patient need any other services or support that we can help arrange? 10. Are caregivers following through as expected in assisting the patient? 11.  Has the patient quit smoking, drinking alcohol, or using drugs as recommended?  Follow up appointment is 12/09/15 arriving @11 :00 for 11:30 with Dr Wynn BankerKirsteins

## 2015-12-05 NOTE — Telephone Encounter (Signed)
Marie °Please mail out a new patient packet with an appointment card inside. °Thank you °

## 2015-12-09 ENCOUNTER — Ambulatory Visit (HOSPITAL_BASED_OUTPATIENT_CLINIC_OR_DEPARTMENT_OTHER): Payer: Commercial Managed Care - HMO | Admitting: Physical Medicine & Rehabilitation

## 2015-12-09 ENCOUNTER — Encounter: Payer: Self-pay | Admitting: Physical Medicine & Rehabilitation

## 2015-12-09 ENCOUNTER — Encounter: Payer: Commercial Managed Care - HMO | Attending: Physical Medicine & Rehabilitation

## 2015-12-09 VITALS — BP 175/73 | HR 70 | Resp 14

## 2015-12-09 DIAGNOSIS — F419 Anxiety disorder, unspecified: Secondary | ICD-10-CM | POA: Insufficient documentation

## 2015-12-09 DIAGNOSIS — F329 Major depressive disorder, single episode, unspecified: Secondary | ICD-10-CM | POA: Insufficient documentation

## 2015-12-09 DIAGNOSIS — I69322 Dysarthria following cerebral infarction: Secondary | ICD-10-CM | POA: Diagnosis not present

## 2015-12-09 DIAGNOSIS — N400 Enlarged prostate without lower urinary tract symptoms: Secondary | ICD-10-CM | POA: Diagnosis not present

## 2015-12-09 DIAGNOSIS — E785 Hyperlipidemia, unspecified: Secondary | ICD-10-CM | POA: Diagnosis not present

## 2015-12-09 DIAGNOSIS — I10 Essential (primary) hypertension: Secondary | ICD-10-CM | POA: Diagnosis not present

## 2015-12-09 DIAGNOSIS — N179 Acute kidney failure, unspecified: Secondary | ICD-10-CM | POA: Diagnosis not present

## 2015-12-09 DIAGNOSIS — I69354 Hemiplegia and hemiparesis following cerebral infarction affecting left non-dominant side: Secondary | ICD-10-CM | POA: Insufficient documentation

## 2015-12-09 DIAGNOSIS — R32 Unspecified urinary incontinence: Secondary | ICD-10-CM | POA: Insufficient documentation

## 2015-12-09 DIAGNOSIS — I69319 Unspecified symptoms and signs involving cognitive functions following cerebral infarction: Secondary | ICD-10-CM | POA: Diagnosis not present

## 2015-12-09 DIAGNOSIS — F319 Bipolar disorder, unspecified: Secondary | ICD-10-CM | POA: Insufficient documentation

## 2015-12-09 NOTE — Patient Instructions (Addendum)
Venia MinksNicholas Taylor MD is the primary physician at [facility] internal medicine clinic. Will need to have an appointment with Dr. Ladona Rid[facility]aylor [facility]  Recommend home health PTOT speech upon discharge from inpatient rehabilitation in [place]  Agree with adult daycare once home health has discontinued.  Would recommend application of skin lotion twice a day to both legs  Would recommend dermatology follow-up for potential vitiligo, this [facility] be done [facility]  Mental health follow-up recommended also [facility] can assist with this  Dental follow-up also recommended, [facility] can assist with this

## 2015-12-09 NOTE — Progress Notes (Signed)
Subjective:    Patient ID: Dominic Pineda, male    DOB: 1951/02/23, 65 y.o.   MRN: [id number] 65 year old right-handed male, with history of documented bipolar disorder, hypertension and CVA in 2009, maintained on aspirin as well as cocaine abuse in the past.  He lives with his sister in a one-level home and used a cane prior to admission.  Presented November 11, 2015, with new onset of slurred speech, right-sided weakness.  Urine drug screen negative.  MRI of the brain showed acute subcentimeter left corona radiata infarct as well as old right MCA territory infarct.  Old small cerebellar infarcts.  Small area of left occipital lobe encephalomalacia.  Echocardiogram with ejection fraction 65% grade 1 diastolic dysfunction.  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  The patient did not receive tPA.  Maintained on aspirin and Plavix therapy for CVA prophylaxis.  Subcutaneous  DATE OF ADMISSION:  11/15/2015 DATE OF DISCHARGE:  11/28/2015   HPI  Patient is currently at the [place] in [place] at the skilled nursing rehabilitation unit. He has been there since discharge from the inpatient rehabilitation unit at [facility] health. Patient has been participating with PT OT and speech therapy. Patient has been ambulating with two-person assistance but no heavy physical support. Patient is incontinent to bowel bladder as before. Plans to be at the [facility] skilled nursing unit until the end of May but still having evaluation and team meetings Next week.  Primary care physician is at the [facility], Also discussed getting a local primary care physician in the [place] area Has been seen by the internal medicine teaching service, Dr. Venia Minks was planning a transition of care visit but this was not scheduled at the time of discharge from  rehabilitation. Patient is accompanied by his daughter who lives in [place]. She indicates that he has had some pain on the left side of his body. Upon further questioning it appears it's in the left ankle. No history of trauma to that area and no history of surgery to that area.  Review of systems: Limited by Severe dysarthria Pain Inventory Average Pain 0 Pain Right Now 0 My pain is no pain  In the last 24 hours, has pain interfered with the following? General activity 0 Relation with others 0 Enjoyment of life 0 What TIME of day is your pain at its worst? no pain Sleep (in general) Fair  Pain is worse with: no pain Pain improves with: no pain Relief from Meds: no pain  Mobility walk with assistance how many minutes can you walk? 3 ability to climb steps?  no do you drive?  no use a wheelchair needs help with transfers Do you have any goals in this area?  yes  Function disabled: date disabled . retired I need assistance with the following:  feeding, dressing, bathing, toileting, meal prep, household duties and shopping Do you have any goals in this area?  yes  Neuro/Psych bladder control problems bowel control problems trouble walking confusion depression anxiety  Prior Studies hospital f/u  Physicians involved in your care hospital f/u   Family History  Problem Relation Age of Onset  . Coronary artery disease Father    Social History   Social History  . Marital Status: Divorced    Spouse Name: N/A  . Number of Children: N/A  . Years of Education: N/A  Social History Main Topics  . Smoking status: Former Smoker -- 1.00 packs/day for 18 years    Types: Cigarettes    Quit date: 07/30/1986  . Smokeless tobacco: Never Used  . Alcohol Use: 0.0 oz/week    0 Standard drinks or equivalent per week     Comment: "quit drinking ~ 2007"  . Drug Use: Yes    Special: "Crack" cocaine, Marijuana     Comment: last crack use 07/2015  . Sexual Activity: Yes     Other Topics Concern  . None   Social History Narrative   Past Surgical History  Procedure Laterality Date  . Incision and drainage of wound      right knee "got piece of wire in it while mowing"  . Inguinal hernia repair      left  . Tonsillectomy      "as a child"   Past Medical History  Diagnosis Date  . Hypertension   . Bipolar 1 disorder (HCC)   . Incontinence   . Mental disorder   . Depression   . Anxiety   . Blood transfusion 1970's  . Shortness of breath     has had a recent increase in episodes of shortness of breath and hyperventilating  . Stroke [facility](HCC) 2009-2010    "have had 3 strokes"; denies residual  . BPH (benign prostatic hyperplasia)   . H/O acute renal failure 08/2011    severe w/hematuria  . Cocaine abuse    BP 175/73 mmHg  Pulse 70  Resp 14  SpO2 97%  Opioid Risk Score:   Fall Risk Score:  `1  Depression screen PHQ 2/9  Depression screen PHQ 2/9 12/09/2015  Decreased Interest 2  Down, Depressed, Hopeless 1  PHQ - 2 Score 3  Altered sleeping 1  Tired, decreased energy 3  Change in appetite 2  Feeling bad or failure about yourself  1  Trouble concentrating 1  Moving slowly or fidgety/restless 1  Suicidal thoughts 0  PHQ-9 Score 12  Difficult doing work/chores Somewhat difficult     Review of Systems  Constitutional: Positive for appetite change.  Respiratory: Positive for apnea and wheezing.   Skin: Positive for rash.  All other systems reviewed and are negative.      Objective:   Physical Exam  Constitutional: He appears well-developed and well-nourished. He appears lethargic.  HENT:  Head: Normocephalic and atraumatic.  Eyes: Conjunctivae and EOM are normal. Pupils are equal, round, and reactive to light.  Neck: Normal range of motion.  Cardiovascular: Normal rate, regular rhythm and normal heart sounds.   Pulmonary/Chest: Effort normal and breath sounds normal.  Abdominal: Soft. Bowel sounds are normal.   Musculoskeletal:       Left foot: There is decreased range of motion. There is no tenderness, no bony tenderness, no swelling, normal capillary refill, no crepitus, no deformity and no laceration.  No pain with bilateral upper extremity range of motion or right lower extremity range of motion has pain with left ankle range of motion with tight heel cord.  Dorsiflexion limited to neutral on the left side  Neurological: He appears lethargic. He is disoriented.  Oriented to person  Cannot assess sensation due to mental status Motor strength is 4/5 in bilateral deltoid, biceps, triceps, grip 4/5 in the right hip flexor and extensor ankle dorsiflexor 3 minus in the left hip flexor and knee extensor and 2 minus ankle dorsiflexor on the left Range of motion Limited by tight heel cord on  the left side.  Psychiatric: His affect is blunt. His speech is delayed and slurred. He is slowed and withdrawn. Cognition and memory are impaired.  Exam limited by severe dysarthria and cognitive deficits He is inattentive.  Nursing note and vitals reviewed. Skin evidence of vitiligo on the face Ichthyosis bilateral pretibial areas        Assessment & Plan:  1.  Right sided weakness and dysarthria secondary to left corona radiata infarct as well as remote right MCA territory infarct- ,We'll continue therapy at the [facility] skilled rehabilitation unit   , Recommend home health PTOT speech post discharge and transition to adult daycare                                           2. Ankle pain, no evidence of joint swelling. Has a tight Achilles tendon and this is likely was causing some discomfort as it gets stretched during standing or walking.Continue PT 3. Mood/bipolar disorder. No present medications, [facility].  4. Skin/Wound Care: Routine skin checks,, Dry skin would recommend applying lotion to both legs twice a day, Also with evidence of vitiligo, recommend dermatology  follow-up 5. Fluids/Electrolytes/Nutrition:Eating approximately 50% of meals, still with pured diet                                                                                                                                                                                                                                                                                                       6. Hypertension., This will be managed at the [facility] but will need follow-up with internal medicine post discharge  7. Hyperlipidemia. Lipitor, Follow-up with internal medicine post discharge, Had follow-up appointment with Dr. Venia Minks which will need to be rescheduled    8.  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

## 2016-01-01 ENCOUNTER — Encounter (HOSPITAL_COMMUNITY): Payer: Self-pay | Admitting: Emergency Medicine

## 2016-01-01 ENCOUNTER — Emergency Department (HOSPITAL_COMMUNITY)
Admission: EM | Admit: 2016-01-01 | Discharge: 2016-01-01 | Disposition: A | Discharge status: Home / Self Care | Payer: Commercial Managed Care - HMO | Attending: Emergency Medicine | Admitting: Emergency Medicine

## 2016-01-01 DIAGNOSIS — E669 Obesity, unspecified: Secondary | ICD-10-CM | POA: Insufficient documentation

## 2016-01-01 DIAGNOSIS — R1310 Dysphagia, unspecified: Secondary | ICD-10-CM | POA: Diagnosis present

## 2016-01-01 DIAGNOSIS — Z8659 Personal history of other mental and behavioral disorders: Secondary | ICD-10-CM | POA: Insufficient documentation

## 2016-01-01 DIAGNOSIS — Z87448 Personal history of other diseases of urinary system: Secondary | ICD-10-CM | POA: Insufficient documentation

## 2016-01-01 DIAGNOSIS — Z87891 Personal history of nicotine dependence: Secondary | ICD-10-CM | POA: Insufficient documentation

## 2016-01-01 DIAGNOSIS — I10 Essential (primary) hypertension: Secondary | ICD-10-CM

## 2016-01-01 DIAGNOSIS — E860 Dehydration: Secondary | ICD-10-CM | POA: Diagnosis not present

## 2016-01-01 DIAGNOSIS — Z7982 Long term (current) use of aspirin: Secondary | ICD-10-CM | POA: Insufficient documentation

## 2016-01-01 DIAGNOSIS — Z792 Long term (current) use of antibiotics: Secondary | ICD-10-CM | POA: Diagnosis not present

## 2016-01-01 DIAGNOSIS — Z7902 Long term (current) use of antithrombotics/antiplatelets: Secondary | ICD-10-CM | POA: Diagnosis not present

## 2016-01-01 DIAGNOSIS — Z87438 Personal history of other diseases of male genital organs: Secondary | ICD-10-CM | POA: Diagnosis not present

## 2016-01-01 DIAGNOSIS — Z79899 Other long term (current) drug therapy: Secondary | ICD-10-CM | POA: Diagnosis not present

## 2016-01-01 LAB — CBC WITH DIFFERENTIAL/PLATELET
Basophils Absolute: 0 10*3/uL (ref 0.0–0.1)
Basophils Relative: 0 %
EOS PCT: 0 %
Eosinophils Absolute: 0 10*3/uL (ref 0.0–0.7)
HEMATOCRIT: 42.8 % (ref 39.0–52.0)
Hemoglobin: 13.9 g/dL (ref 13.0–17.0)
LYMPHS ABS: 6.8 10*3/uL — AB (ref 0.7–4.0)
Lymphocytes Relative: 49 %
MCH: 28.3 pg (ref 26.0–34.0)
MCHC: 32.5 g/dL (ref 30.0–36.0)
MCV: 87.2 fL (ref 78.0–100.0)
MONOS PCT: 4 %
Monocytes Absolute: 0.6 10*3/uL (ref 0.1–1.0)
NEUTROS ABS: 6.6 10*3/uL (ref 1.7–7.7)
Neutrophils Relative %: 47 %
Platelets: 247 10*3/uL (ref 150–400)
RBC: 4.91 MIL/uL (ref 4.22–5.81)
RDW: 14.8 % (ref 11.5–15.5)
WBC: 14 10*3/uL — ABNORMAL HIGH (ref 4.0–10.5)

## 2016-01-01 LAB — BASIC METABOLIC PANEL
Anion gap: 12 (ref 5–15)
BUN: 27 mg/dL — AB (ref 6–20)
CHLORIDE: 112 mmol/L — AB (ref 101–111)
CO2: 21 mmol/L — ABNORMAL LOW (ref 22–32)
Calcium: 9.6 mg/dL (ref 8.9–10.3)
Creatinine, Ser: 1.37 mg/dL — ABNORMAL HIGH (ref 0.61–1.24)
GFR calc Af Amer: >60 mL/min (ref >60)
GFR calc non Af Amer: 52 mL/min — ABNORMAL LOW (ref >60)
GLUCOSE: 106 mg/dL — AB (ref 65–99)
POTASSIUM: 4.5 mmol/L (ref 3.5–5.1)
Sodium: 145 mmol/L (ref 135–145)

## 2016-01-01 MED ORDER — VERAPAMIL HCL ER 240 MG PO TBCR
240.0000 mg | EXTENDED_RELEASE_TABLET | ORAL | Status: AC
  Administered 2016-01-01: 240 mg via ORAL
  Filled 2016-01-01: qty 1

## 2016-01-01 MED ORDER — SODIUM CHLORIDE 0.9 % IV BOLUS (SEPSIS)
1000.0000 mL | Freq: Once | INTRAVENOUS | Status: AC
Start: 2016-01-01 — End: 2016-01-01
  Administered 2016-01-01: 1000 mL via INTRAVENOUS

## 2016-01-01 NOTE — ED Notes (Signed)
Pt asked about pain and pt makes non verbal sound reply. Pt uses no words.Daughter state he was talking in short sentences before he went to nursing home 3 days ago. Daughter states " he can talk if he wants to".

## 2016-01-01 NOTE — ED Notes (Addendum)
Family member requesting [facility] transport back to facility. Necessary forms printed by this RN and given to Unit Secretary. [facility] called for transport.

## 2016-01-01 NOTE — ED Provider Notes (Signed)
CSN: [id number]     Arrival date & time 01/01/16  1333 History   First MD Initiated Contact with Patient 01/01/16 1337     Chief Complaint  Patient presents with  . Dysphagia   Level V caveat: Patient is nonverbal secondary to CVA.  (Consider location/radiation/quality/duration/timing/severity/associated sxs/prior Treatment) HPI Dominic Pineda is a 65 y.o. male with a history of CVA most recently in April, discharged to skilled nursing facility at [facility] and most recently transferred to [facility] on May 31. Staff at [facility] concern patient may not be swallowing as well. Patient has been on pured diet since CVA in April. Daughter at bedside reports patient is still defecating and urinating per usual. She reports that he is noncompliant with feeding as he has upset with his situation. He is nonverbal at baseline has right-sided hemiparesis, history of dementia.  Past Medical History  Diagnosis Date  . Hypertension   . Bipolar 1 disorder (HCC)   . Incontinence   . Mental disorder   . Depression   . Anxiety   . Blood transfusion 1970's  . Shortness of breath     has had a recent increase in episodes of shortness of breath and hyperventilating  . Stroke [facility]) 2009-2010    "have had 3 strokes"; denies residual  . BPH (benign prostatic hyperplasia)   . H/O acute renal failure 08/2011    severe w/hematuria  . Cocaine abuse    Past Surgical History  Procedure Laterality Date  . Incision and drainage of wound      right knee "got piece of wire in it while mowing"  . Inguinal hernia repair      left  . Tonsillectomy      "as a child"   Family History  Problem Relation Age of Onset  . Coronary artery disease Father    Social History  Substance Use Topics  . Smoking status: Former Smoker -- 1.00 packs/day for 18 years    Types: Cigarettes    Quit date: 07/30/1986  . Smokeless tobacco: Never Used  . Alcohol Use: 0.0 oz/week    0 Standard drinks or  equivalent per week     Comment: "quit drinking ~ 2007"    Review of Systems  Unable to perform ROS: Patient nonverbal      Allergies  Lactose intolerance (gi); Lisinopril; and Codeine  Home Medications   Prior to Admission medications   Medication Sig Start Date End Date Taking? Authorizing Provider  aspirin EC 81 MG tablet Take 1 tablet (81 mg total) by mouth daily. Take for the next 3 months (April 14 - July 14) with Plavix 75 mg daily.  Then STOP Aspirin and take only Plavix. 11/15/15   Jana Half, MD  atorvastatin (LIPITOR) 40 MG tablet Take 1 tablet (40 mg total) by mouth daily at 6 PM. 11/15/15   Jana Half, MD  Cholecalciferol (VITAMIN D) 2000 units CAPS Take 2,000 Units by mouth daily.    Historical Provider, MD  ciprofloxacin (CIPRO) 500 MG tablet Take 1 tablet (500 mg total) by mouth 2 (two) times daily. Last dose 4/20. 11/15/15   Jana Half, MD  clopidogrel (PLAVIX) 75 MG tablet Take 1 tablet (75 mg total) by mouth daily. 11/15/15   Jana Half, MD  lisinopril (PRINIVIL,ZESTRIL) 40 MG tablet Take 1 tablet (40 mg total) by mouth daily. 11/15/15   Jana Half, MD  senna-docusate (SENOKOT-S) 8.6-50 MG tablet Take 1  tablet by mouth at bedtime as needed for mild constipation. 11/15/15   Jana HalfNicholas A Taylor, MD  verapamil (CALAN-SR) 240 MG CR tablet Take 1 tablet (240 mg total) by mouth at bedtime. Patient taking differently: Take 240 mg by mouth every morning.  09/09/13   Dione Boozeavid Glick, MD   There were no vitals taken for this visit. Physical Exam  Constitutional: He is oriented to person, place, and time. He appears well-developed and well-nourished.  Obese African-American male. Alert at bedside and in no apparent distress.  HENT:  Head: Normocephalic and atraumatic.  Mouth/Throat: Oropharynx is clear and moist.  Cracked lips. Moist mucous membranes.  Eyes: Conjunctivae are normal. Pupils are equal, round, and reactive to light. Right eye exhibits no  discharge. Left eye exhibits no discharge. No scleral icterus.  Neck: Neck supple.  Cardiovascular: Normal rate, regular rhythm and normal heart sounds.   Pulmonary/Chest: Effort normal and breath sounds normal. No respiratory distress. He has no wheezes. He has no rales.  Abdominal: Soft. There is no tenderness.  Musculoskeletal: He exhibits no tenderness.  Neurological: He is alert and oriented to person, place, and time.  Cranial Nerves II-XII grossly intact  Skin: Skin is warm and dry. No rash noted.  Psychiatric: He has a normal mood and affect.  Nursing note and vitals reviewed.   ED Course  Procedures (including critical care time) Labs Review Labs Reviewed - No data to display  Imaging Review No results found. I have personally reviewed and evaluated these images and lab results as part of my medical decision-making.   EKG Interpretation None      MDM  Patient here from [facility] facility for evaluation of nutritional/hydration status and difficulty swallowing. Patient on arrival is hemodynamically stable and is baseline per daughter in the room. He does have mildly dry lips, but moist mucous membranes. Still urinating and having bowel movements per usual. He may have mild dehydration. Low suspicion for acute CVA or other neurologic emergency. We will give 1 L normal saline fluid bolus and check basic labs to ensure no electrolyte abnormalities. If no objective findings, anticipate patient will be discharged back to his facility with outpatient follow-up with GI for further evaluation of swallowing difficulty. Discussed with my attending, Dr. Donnald GarrePfeiffer, who also saw and evaluated the patient and agrees with plan. Discussed ED course, results with patient and daughter at bedside. They verbalized understanding and agreement with plan for discharge after IV fluids.  Final diagnoses:  Mild dehydration  Essential hypertension        Joycie PeekBenjamin Delisa Finck,  PA-C 01/02/16 [id number]  Arby BarretteMarcy Pfeiffer, MD 01/02/16 [id number]

## 2016-01-01 NOTE — ED Notes (Signed)
Pt to ER BIB [facility] from [facility] facility where patient has been a resident for only 3 days. Per staff patient has been unable to swallow entire time. Staff unsure if this is baseline and would like to have him evaluated. Pt has hx of CVA with right sided deficits. Hx of dementia, alert to self. Does not speak, only mumbles. Airway intact at present. VS - 142/80, HR 92, CBG 105, 98% on RA.

## 2016-01-01 NOTE — ED Notes (Signed)
[facility] unable to transport pt to care facility due to hypertension. Dr. Fayrene FearingJames notified. Family notified. Orders placed for Verapamil...will give to pt when available and attempt transport to care facility again.

## 2016-01-01 NOTE — Discharge Instructions (Signed)
He may have very mild dehydration. You were given fluids while in the emergency Department. Your labs are reassuring as well as your physical exam. Please follow-up with your doctor for reevaluation as needed. Follow-up with gastroenterology for reevaluation of your swallowing difficulty. Return to ED for new or worsening symptoms as we discussed.  Patients blood pressure is high in the emergency room. He was given additional blood pressure medications. Blood pressures will have to slowly improve in the next 12-24 hours. If they remain elevated, please contact his primary care physician tomorrow for further instructions.  Dehydration, Adult Dehydration is a condition in which you do not have enough fluid or water in your body. It happens when you take in less fluid than you lose. Vital organs such as the kidneys, brain, and heart cannot function without a proper amount of fluids. Any loss of fluids from the body can cause dehydration.  Dehydration can range from mild to severe. This condition should be treated right away to help prevent it from becoming severe. CAUSES  This condition may be caused by:  Vomiting.  Diarrhea.  Excessive sweating, such as when exercising in hot or humid weather.  Not drinking enough fluid during strenuous exercise or during an illness.  Excessive urine output.  Fever.  Certain medicines. RISK FACTORS This condition is more likely to develop in:  People who are taking certain medicines that cause the body to lose excess fluid (diuretics).   People who have a chronic illness, such as diabetes, that may increase urination.  Older adults.   People who live at high altitudes.   People who participate in endurance sports.  SYMPTOMS  Mild Dehydration  Thirst.  Dry lips.  Slightly dry mouth.  Dry, warm skin. Moderate Dehydration  Very dry mouth.   Muscle cramps.   Dark urine and decreased urine production.   Decreased tear production.    Headache.   Light-headedness, especially when you stand up from a sitting position.  Severe Dehydration  Changes in skin.   Cold and clammy skin.   Skin does not spring back quickly when lightly pinched and released.   Changes in body fluids.   Extreme thirst.   No tears.   Not able to sweat when body temperature is high, such as in hot weather.   Minimal urine production.   Changes in vital signs.   Rapid, weak pulse (more than 100 beats per minute when you are sitting still).   Rapid breathing.   Low blood pressure.   Other changes.   Sunken eyes.   Cold hands and feet.   Confusion.  Lethargy and difficulty being awakened.  Fainting (syncope).   Short-term weight loss.   Unconsciousness. DIAGNOSIS  This condition may be diagnosed based on your symptoms. You may also have tests to determine how severe your dehydration is. These tests may include:   Urine tests.   Blood tests.  TREATMENT  Treatment for this condition depends on the severity. Mild or moderate dehydration can often be treated at home. Treatment should be started right away. Do not wait until dehydration becomes severe. Severe dehydration needs to be treated at the hospital. Treatment for Mild Dehydration  Drinking plenty of water to replace the fluid you have lost.   Replacing minerals in your blood (electrolytes) that you may have lost.  Treatment for Moderate Dehydration  Consuming oral rehydration solution (ORS). Treatment for Severe Dehydration  Receiving fluid through an IV tube.   Receiving electrolyte solution through  a feeding tube that is passed through your nose and into your stomach (nasogastric tube or NG tube).  Correcting any abnormalities in electrolytes. HOME CARE INSTRUCTIONS   Drink enough fluid to keep your urine clear or pale yellow.   Drink water or fluid slowly by taking small sips. You can also try sucking on ice cubes.  Have  food or beverages that contain electrolytes. Examples include bananas and sports drinks.  Take over-the-counter and prescription medicines only as told by your health care provider.   Prepare ORS according to the manufacturer's instructions. Take sips of ORS every 5 minutes until your urine returns to normal.  If you have vomiting or diarrhea, continue to try to drink water, ORS, or both.   If you have diarrhea, avoid:   Beverages that contain caffeine.   Fruit juice.   Milk.   Carbonated soft drinks.  Do not take salt tablets. This can lead to the condition of having too much sodium in your body (hypernatremia).  SEEK MEDICAL CARE IF:  You cannot eat or drink without vomiting.  You have had moderate diarrhea during a period of more than 24 hours.  You have a fever. SEEK IMMEDIATE MEDICAL CARE IF:   You have extreme thirst.  You have severe diarrhea.  You have not urinated in 6-8 hours, or you have urinated only a small amount of very dark urine.  You have shriveled skin.  You are dizzy, confused, or both.   This information is not intended to replace advice given to you by your health care provider. Make sure you discuss any questions you have with your health care provider.   Document Released: 07/16/2005 Document Revised: 04/06/2015 Document Reviewed: 12/01/2014 Elsevier Interactive Patient Education [employer]2016 Elsevier Inc.

## 2016-01-02 ENCOUNTER — Encounter: Payer: Self-pay | Admitting: Gastroenterology

## 2016-01-03 ENCOUNTER — Encounter: Payer: Self-pay | Admitting: Gastroenterology

## 2016-01-06 ENCOUNTER — Inpatient Hospital Stay (HOSPITAL_COMMUNITY)
Admission: EM | Admit: 2016-01-06 | Discharge: 2016-01-11 | DRG: 064 | Disposition: A | Discharge status: Hospice | Payer: Commercial Managed Care - HMO | Attending: Internal Medicine | Admitting: Internal Medicine

## 2016-01-06 ENCOUNTER — Inpatient Hospital Stay (HOSPITAL_COMMUNITY): Payer: Commercial Managed Care - HMO

## 2016-01-06 ENCOUNTER — Encounter (HOSPITAL_COMMUNITY): Payer: Self-pay

## 2016-01-06 ENCOUNTER — Emergency Department (HOSPITAL_COMMUNITY): Payer: Commercial Managed Care - HMO

## 2016-01-06 DIAGNOSIS — E785 Hyperlipidemia, unspecified: Secondary | ICD-10-CM | POA: Diagnosis present

## 2016-01-06 DIAGNOSIS — F209 Schizophrenia, unspecified: Secondary | ICD-10-CM | POA: Diagnosis present

## 2016-01-06 DIAGNOSIS — N179 Acute kidney failure, unspecified: Secondary | ICD-10-CM | POA: Diagnosis present

## 2016-01-06 DIAGNOSIS — Z79899 Other long term (current) drug therapy: Secondary | ICD-10-CM

## 2016-01-06 DIAGNOSIS — I10 Essential (primary) hypertension: Secondary | ICD-10-CM | POA: Diagnosis present

## 2016-01-06 DIAGNOSIS — E870 Hyperosmolality and hypernatremia: Secondary | ICD-10-CM | POA: Diagnosis present

## 2016-01-06 DIAGNOSIS — R4701 Aphasia: Secondary | ICD-10-CM | POA: Diagnosis present

## 2016-01-06 DIAGNOSIS — Z6835 Body mass index (BMI) 35.0-35.9, adult: Secondary | ICD-10-CM | POA: Diagnosis not present

## 2016-01-06 DIAGNOSIS — Z7902 Long term (current) use of antithrombotics/antiplatelets: Secondary | ICD-10-CM

## 2016-01-06 DIAGNOSIS — Z515 Encounter for palliative care: Secondary | ICD-10-CM | POA: Diagnosis not present

## 2016-01-06 DIAGNOSIS — G0490 Encephalitis and encephalomyelitis, unspecified: Secondary | ICD-10-CM

## 2016-01-06 DIAGNOSIS — N390 Urinary tract infection, site not specified: Secondary | ICD-10-CM | POA: Diagnosis present

## 2016-01-06 DIAGNOSIS — Z7289 Other problems related to lifestyle: Secondary | ICD-10-CM | POA: Diagnosis not present

## 2016-01-06 DIAGNOSIS — I129 Hypertensive chronic kidney disease with stage 1 through stage 4 chronic kidney disease, or unspecified chronic kidney disease: Secondary | ICD-10-CM | POA: Diagnosis present

## 2016-01-06 DIAGNOSIS — E669 Obesity, unspecified: Secondary | ICD-10-CM | POA: Diagnosis present

## 2016-01-06 DIAGNOSIS — G8191 Hemiplegia, unspecified affecting right dominant side: Secondary | ICD-10-CM | POA: Diagnosis present

## 2016-01-06 DIAGNOSIS — I639 Cerebral infarction, unspecified: Secondary | ICD-10-CM | POA: Diagnosis not present

## 2016-01-06 DIAGNOSIS — Z66 Do not resuscitate: Secondary | ICD-10-CM | POA: Diagnosis not present

## 2016-01-06 DIAGNOSIS — I69322 Dysarthria following cerebral infarction: Secondary | ICD-10-CM

## 2016-01-06 DIAGNOSIS — R4182 Altered mental status, unspecified: Secondary | ICD-10-CM | POA: Diagnosis present

## 2016-01-06 DIAGNOSIS — F419 Anxiety disorder, unspecified: Secondary | ICD-10-CM | POA: Diagnosis present

## 2016-01-06 DIAGNOSIS — L8990 Pressure ulcer of unspecified site, unspecified stage: Secondary | ICD-10-CM | POA: Insufficient documentation

## 2016-01-06 DIAGNOSIS — R1310 Dysphagia, unspecified: Secondary | ICD-10-CM | POA: Diagnosis present

## 2016-01-06 DIAGNOSIS — E876 Hypokalemia: Secondary | ICD-10-CM | POA: Diagnosis not present

## 2016-01-06 DIAGNOSIS — Z7982 Long term (current) use of aspirin: Secondary | ICD-10-CM

## 2016-01-06 DIAGNOSIS — I63032 Cerebral infarction due to thrombosis of left carotid artery: Secondary | ICD-10-CM | POA: Diagnosis not present

## 2016-01-06 DIAGNOSIS — G92 Toxic encephalopathy: Secondary | ICD-10-CM | POA: Diagnosis present

## 2016-01-06 DIAGNOSIS — R627 Adult failure to thrive: Secondary | ICD-10-CM | POA: Diagnosis not present

## 2016-01-06 DIAGNOSIS — G9340 Encephalopathy, unspecified: Secondary | ICD-10-CM | POA: Diagnosis not present

## 2016-01-06 DIAGNOSIS — N178 Other acute kidney failure: Secondary | ICD-10-CM | POA: Diagnosis not present

## 2016-01-06 DIAGNOSIS — I63412 Cerebral infarction due to embolism of left middle cerebral artery: Principal | ICD-10-CM | POA: Diagnosis present

## 2016-01-06 DIAGNOSIS — F319 Bipolar disorder, unspecified: Secondary | ICD-10-CM | POA: Diagnosis present

## 2016-01-06 DIAGNOSIS — Z7189 Other specified counseling: Secondary | ICD-10-CM | POA: Diagnosis not present

## 2016-01-06 DIAGNOSIS — N183 Chronic kidney disease, stage 3 (moderate): Secondary | ICD-10-CM | POA: Diagnosis present

## 2016-01-06 DIAGNOSIS — E860 Dehydration: Secondary | ICD-10-CM | POA: Diagnosis present

## 2016-01-06 DIAGNOSIS — N19 Unspecified kidney failure: Secondary | ICD-10-CM

## 2016-01-06 DIAGNOSIS — Z789 Other specified health status: Secondary | ICD-10-CM | POA: Diagnosis not present

## 2016-01-06 DIAGNOSIS — Z8673 Personal history of transient ischemic attack (TIA), and cerebral infarction without residual deficits: Secondary | ICD-10-CM

## 2016-01-06 DIAGNOSIS — N189 Chronic kidney disease, unspecified: Secondary | ICD-10-CM | POA: Diagnosis present

## 2016-01-06 DIAGNOSIS — I6789 Other cerebrovascular disease: Secondary | ICD-10-CM | POA: Diagnosis not present

## 2016-01-06 DIAGNOSIS — Z87891 Personal history of nicotine dependence: Secondary | ICD-10-CM | POA: Diagnosis not present

## 2016-01-06 DIAGNOSIS — R6251 Failure to thrive (child): Secondary | ICD-10-CM | POA: Diagnosis present

## 2016-01-06 DIAGNOSIS — J9600 Acute respiratory failure, unspecified whether with hypoxia or hypercapnia: Secondary | ICD-10-CM | POA: Diagnosis not present

## 2016-01-06 DIAGNOSIS — B9561 Methicillin susceptible Staphylococcus aureus infection as the cause of diseases classified elsewhere: Secondary | ICD-10-CM | POA: Diagnosis present

## 2016-01-06 DIAGNOSIS — R531 Weakness: Secondary | ICD-10-CM

## 2016-01-06 LAB — URINE MICROSCOPIC-ADD ON

## 2016-01-06 LAB — CBC WITH DIFFERENTIAL/PLATELET
BASOS PCT: 0 %
Basophils Absolute: 0 10*3/uL (ref 0.0–0.1)
EOS ABS: 0 10*3/uL (ref 0.0–0.7)
Eosinophils Relative: 0 %
HCT: 47.6 % (ref 39.0–52.0)
HEMOGLOBIN: 15.4 g/dL (ref 13.0–17.0)
LYMPHS PCT: 51 %
Lymphs Abs: 9.3 10*3/uL — ABNORMAL HIGH (ref 0.7–4.0)
MCH: 29.3 pg (ref 26.0–34.0)
MCHC: 32.4 g/dL (ref 30.0–36.0)
MCV: 90.7 fL (ref 78.0–100.0)
Monocytes Absolute: 0.2 10*3/uL (ref 0.1–1.0)
Monocytes Relative: 1 %
NEUTROS ABS: 8.8 10*3/uL — AB (ref 1.7–7.7)
Neutrophils Relative %: 48 %
Platelets: 260 10*3/uL (ref 150–400)
RBC: 5.25 MIL/uL (ref 4.22–5.81)
RDW: 15.9 % — ABNORMAL HIGH (ref 11.5–15.5)
WBC: 18.3 10*3/uL — ABNORMAL HIGH (ref 4.0–10.5)

## 2016-01-06 LAB — CBG MONITORING, ED: GLUCOSE-CAPILLARY: 116 mg/dL — AB (ref 65–99)

## 2016-01-06 LAB — COMPREHENSIVE METABOLIC PANEL
ALBUMIN: 4.6 g/dL (ref 3.5–5.0)
ALK PHOS: 100 U/L (ref 38–126)
ALT: 21 U/L (ref 17–63)
ANION GAP: 12 (ref 5–15)
AST: 22 U/L (ref 15–41)
BUN: 53 mg/dL — AB (ref 6–20)
CALCIUM: 9.5 mg/dL (ref 8.9–10.3)
CO2: 22 mmol/L (ref 22–32)
CREATININE: 1.91 mg/dL — AB (ref 0.61–1.24)
Chloride: 120 mmol/L — ABNORMAL HIGH (ref 101–111)
GFR calc Af Amer: 40 mL/min — ABNORMAL LOW (ref >60)
GFR calc non Af Amer: 35 mL/min — ABNORMAL LOW (ref >60)
GLUCOSE: 128 mg/dL — AB (ref 65–99)
Potassium: 4 mmol/L (ref 3.5–5.1)
SODIUM: 154 mmol/L — AB (ref 135–145)
Total Bilirubin: 3.2 mg/dL — ABNORMAL HIGH (ref 0.3–1.2)
Total Protein: 8.2 g/dL — ABNORMAL HIGH (ref 6.5–8.1)

## 2016-01-06 LAB — URINALYSIS, ROUTINE W REFLEX MICROSCOPIC
Glucose, UA: NEGATIVE mg/dL
Ketones, ur: 15 mg/dL — AB
Nitrite: POSITIVE — AB
PH: 5 (ref 5.0–8.0)
Protein, ur: NEGATIVE mg/dL
SPECIFIC GRAVITY, URINE: 1.027 (ref 1.005–1.030)

## 2016-01-06 MED ORDER — AQUAPHOR EX OINT
TOPICAL_OINTMENT | Freq: Every day | CUTANEOUS | Status: DC
  Administered 2016-01-07 – 2016-01-08 (×2): via TOPICAL
  Filled 2016-01-06: qty 50

## 2016-01-06 MED ORDER — HEPARIN SODIUM (PORCINE) 5000 UNIT/ML IJ SOLN
5000.0000 [IU] | Freq: Three times a day (TID) | INTRAMUSCULAR | Status: DC
  Administered 2016-01-06 – 2016-01-08 (×6): 5000 [IU] via SUBCUTANEOUS
  Filled 2016-01-06 (×8): qty 1

## 2016-01-06 MED ORDER — ATORVASTATIN CALCIUM 40 MG PO TABS
40.0000 mg | ORAL_TABLET | Freq: Every day | ORAL | Status: DC
  Filled 2016-01-06 (×3): qty 1

## 2016-01-06 MED ORDER — DEXTROSE 5 % IV SOLN
1.0000 g | Freq: Once | INTRAVENOUS | Status: AC
  Administered 2016-01-06: 1 g via INTRAVENOUS
  Filled 2016-01-06: qty 10

## 2016-01-06 MED ORDER — SODIUM CHLORIDE 0.9 % IV SOLN: INTRAVENOUS | Status: DC

## 2016-01-06 MED ORDER — DEXTROSE 5 % IV SOLN
1.0000 g | INTRAVENOUS | Status: DC
  Administered 2016-01-07: 1 g via INTRAVENOUS
  Filled 2016-01-06 (×2): qty 10

## 2016-01-06 MED ORDER — HYDROPHILIC EX OINT: 1.0000 "application " | TOPICAL_OINTMENT | Freq: Every day | CUTANEOUS | Status: DC

## 2016-01-06 MED ORDER — ASPIRIN EC 81 MG PO TBEC
81.0000 mg | DELAYED_RELEASE_TABLET | Freq: Every day | ORAL | Status: DC
  Filled 2016-01-06 (×2): qty 1

## 2016-01-06 MED ORDER — CLOPIDOGREL BISULFATE 75 MG PO TABS
75.0000 mg | ORAL_TABLET | Freq: Every day | ORAL | Status: DC
  Filled 2016-01-06 (×2): qty 1

## 2016-01-06 MED ORDER — ASPIRIN EC 81 MG PO TBEC: 81.0000 mg | DELAYED_RELEASE_TABLET | Freq: Every day | ORAL | Status: DC

## 2016-01-06 MED ORDER — DEXTROSE 5 % IV SOLN
INTRAVENOUS | Status: DC
  Administered 2016-01-06 – 2016-01-07 (×3): via INTRAVENOUS

## 2016-01-06 MED ORDER — SODIUM CHLORIDE 0.9 % IV BOLUS (SEPSIS)
1000.0000 mL | Freq: Once | INTRAVENOUS | Status: AC
  Administered 2016-01-06: 1000 mL via INTRAVENOUS

## 2016-01-06 NOTE — ED Notes (Signed)
BIB [facility], pts family concerned about dehyradation, not eating, failure to thrive. Pt has hx of stroke, schizofrenia, and bipolar. Pt is alert and non-verbal at baseline.

## 2016-01-06 NOTE — ED Notes (Signed)
Bed: WA06 Expected date:  Expected time:  Means of arrival:  Comments: EMS- 64yo FTT

## 2016-01-06 NOTE — H&P (Signed)
History and Physical  Dominic Pineda:[id number] DOB: 1951-04-14 DOA: 01/06/2016  Referring physician: ER Physician PCP: PROVIDER NOT IN SYSTEM  Outpatient Specialists:    Patient coming from: Assisted living facility  Chief Complaint: Poor po intake and altered Mentation  HPI: 65 year old male with history of recent CVA in April of this year and currently residing in an assisted living facility, Bipolar, hypertension, BPH, depression and anxiety. Over the last week, patient has had decreased po intake with associated altered mentation. On presentation to the hospital, patient's sodium was found to be 154 (estimated water deficit of 7.5L using assuming weight of 125 KG), BUN of 53, Scr of 1.91 (up from 1.37). UA is suggestive of UTI. No history available from patient. Patient was seen alongside patient's daughter (from [place]).  ED Course: Hydration  Pertinent labs: Sodium of 154, BUN of 53, Scr of 1.91. EKG: Independently reviewed.  Imaging: independently reviewed.   Review of Systems: Unobtainable.    Past Medical History  Diagnosis Date  . Hypertension   . Bipolar 1 disorder (HCC)   . Incontinence   . Mental disorder   . Depression   . Anxiety   . Blood transfusion 1970's  . Shortness of breath     has had a recent increase in episodes of shortness of breath and hyperventilating  . Stroke [facility]) 2009-2010    "have had 3 strokes"; denies residual  . BPH (benign prostatic hyperplasia)   . H/O acute renal failure 08/2011    severe w/hematuria  . Cocaine abuse     Past Surgical History  Procedure Laterality Date  . Incision and drainage of wound      right knee "got piece of wire in it while mowing"  . Inguinal hernia repair      left  . Tonsillectomy      "as a child"     reports that he quit smoking about 29 years ago. His smoking use included Cigarettes. He has a 18 pack-year smoking history. He has never used smokeless tobacco. He reports that he drinks alcohol.  He reports that he does not use illicit drugs.  Allergies  Allergen Reactions  . Lactose Intolerance (Gi) Diarrhea    Stomach pain  . Lisinopril Other (See Comments)    angioedema  . Codeine Nausea Only    Family History  Problem Relation Age of Onset  . Coronary artery disease Father      Prior to Admission medications   Medication Sig Start Date End Date Taking? Authorizing Provider  aspirin EC 81 MG tablet Take 1 tablet (81 mg total) by mouth daily. Take for the next 3 months (April 14 - July 14) with Plavix 75 mg daily.  Then STOP Aspirin and take only Plavix. 11/15/15  Yes Jana Half, MD  atorvastatin (LIPITOR) 40 MG tablet Take 1 tablet (40 mg total) by mouth daily at 6 PM. Patient taking differently: Take 40 mg by mouth daily.  11/15/15  Yes Jana Half, MD  Cholecalciferol (VITAMIN D) 2000 units tablet Take 2,000 Units by mouth daily.   Yes Historical Provider, MD  clopidogrel (PLAVIX) 75 MG tablet Take 1 tablet (75 mg total) by mouth daily. 11/15/15  Yes Jana Half, MD  hydrophilic ointment Apply 1 application topically daily. Apply to both legs for dry kin   Yes Historical Provider, MD    Physical Exam: Filed Vitals:   01/06/16 1200 01/06/16 1205 01/06/16 1429 01/06/16 1602  BP:  103/85 143/86 131/64  Pulse:  111 102 84  Temp:    98 F (36.7 C)  TempSrc:    Oral  Resp:  18 18 20   SpO2: 97% 97% 98% 99%    Constitutional:  . Resistive to Medical intervention. Eyes:  . No pallor or jaundice ENMT:  . external ears, nose appear normal Neck:  Neck is supple. No JVD.  Respiratory:  . CTA bilaterally, no w/r/r.  Cardiovascular:  . RRR, no m/r/g . No LE extremity edema   Abdomen:  Abdomen appears normal; no tenderness   Neurologic:  . Difficult to assess as patient is resistive. Moves all limbs.  Wt Readings from Last 3 Encounters:  11/23/15 125.193 kg (276 lb)  11/11/15 127.189 kg (280 lb 6.4 oz)  09/08/13 107.956 kg (238 lb)    I have  personally reviewed following labs and imaging studies  Labs on Admission:  CBC:  Recent Labs Lab 01/01/16 1543 01/06/16 1253  WBC 14.0* 18.3*  NEUTROABS 6.6 8.8*  HGB 13.9 15.4  HCT 42.8 47.6  MCV 87.2 90.7  PLT 247 260   Basic Metabolic Panel:  Recent Labs Lab 01/01/16 1543 01/06/16 1253  NA 145 154*  K 4.5 4.0  CL 112* 120*  CO2 21* 22  GLUCOSE 106* 128*  BUN 27* 53*  CREATININE 1.37* 1.91*  CALCIUM 9.6 9.5   Liver Function Tests:  Recent Labs Lab 01/06/16 1253  AST 22  ALT 21  ALKPHOS 100  BILITOT 3.2*  PROT 8.2*  ALBUMIN 4.6   No results for input(s): LIPASE, AMYLASE in the last 168 hours. No results for input(s): AMMONIA in the last 168 hours. Coagulation Profile: No results for input(s): INR, PROTIME in the last 168 hours. Cardiac Enzymes: No results for input(s): CKTOTAL, CKMB, CKMBINDEX, TROPONINI in the last 168 hours. BNP (last 3 results) No results for input(s): PROBNP in the last 8760 hours. HbA1C: No results for input(s): HGBA1C in the last 72 hours. CBG:  Recent Labs Lab 01/06/16 1224  GLUCAP 116*   Lipid Profile: No results for input(s): CHOL, HDL, LDLCALC, TRIG, CHOLHDL, LDLDIRECT in the last 72 hours. Thyroid Function Tests: No results for input(s): TSH, T4TOTAL, FREET4, T3FREE, THYROIDAB in the last 72 hours. Anemia Panel: No results for input(s): VITAMINB12, FOLATE, FERRITIN, TIBC, IRON, RETICCTPCT in the last 72 hours. Urine analysis:    Component Value Date/Time   COLORURINE AMBER* 01/06/2016 1257   APPEARANCEUR CLOUDY* 01/06/2016 1257   LABSPEC 1.027 01/06/2016 1257   PHURINE 5.0 01/06/2016 1257   GLUCOSEU NEGATIVE 01/06/2016 1257   HGBUR SMALL* 01/06/2016 1257   BILIRUBINUR SMALL* 01/06/2016 1257   KETONESUR 15* 01/06/2016 1257   PROTEINUR NEGATIVE 01/06/2016 1257   UROBILINOGEN 1.0 03/14/2013 1055   NITRITE POSITIVE* 01/06/2016 1257   LEUKOCYTESUR LARGE* 01/06/2016 1257   Sepsis  Labs: @LABRCNTIP (procalcitonin:4,lacticidven:4) )No results found for this or any previous visit (from the past 240 hour(s)).    Radiological Exams on Admission: Dg Chest Port 1 View  01/06/2016  CLINICAL DATA:  Weakness.  History of stroke. EXAM: PORTABLE CHEST 1 VIEW COMPARISON:  11/25/2015 FINDINGS: Cardiomediastinal silhouette is normal. Mediastinal contours appear intact. There is no evidence of focal airspace consolidation, pleural effusion or pneumothorax. There is chronic elevation of the right hemidiaphragm and associated right lower lobe atelectasis. Osseous structures are without acute abnormality. Soft tissues are grossly normal. IMPRESSION: No active disease. Chronic right lower lobe atelectasis with elevation of the right hemidiaphragm. Electronically Signed   By:  Ted Mcalpine M.D.   On: 01/06/2016 13:27    EKG: Independently reviewed.   Active Problems:   Acute renal failure (HCC)   Failure to thrive (0-17)   Assessment/Plan 1. Failure to thrive 2. Possible post CVA depression 3. Dehydration 4. Hypernatremia 5. UTI 6. Toxic encephalopathy 7. Acute kidney injury likely prerenal   Admit patient   Hydration  Pan culture patient  IV rocephin  AKI work up (Urine studies. For further studies if no improvement)  Serial BMP (as patient has hypernatremia)  Speech evaluation  PT/OT consult  Social worker consult  DVT prophylaxis: [place] Heparin Code Status: Full Family Communication: Daughter Disposition Plan: Undetermined   Consults called: None   Admission status: Inpatient    Time spent: 60 minutes  Berton Mount, MD  [facility] Pager #:  7PM-7AM contact night coverage as above  01/06/2016, 5:59 PM

## 2016-01-06 NOTE — ED Provider Notes (Signed)
CSN: [id number]     Arrival date & time 01/06/16  1154 History   First MD Initiated Contact with Patient 01/06/16 1203     Chief Complaint  Patient presents with  . Failure To Thrive     (Consider location/radiation/quality/duration/timing/severity/associated sxs/prior Treatment) HPI Comments: Patient is a 65 year old male with past medical history of bipolar and schizophrenia. 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 [facility] was sent [facility] for evaluation of decreased by mouth intake. He has been a resident there for the past week, however has had very little to eat or drink. He was sent for evaluation of possible dehydration. He was seen several days ago with similar complaints. He had laboratory studies performed and given IV fluids, however nothing obvious turned up. He returns today with similar complaints.  Patient adds no additional history as he is nonverbal.  The history is provided by the patient.    Past Medical History  Diagnosis Date  . Hypertension   . Bipolar 1 disorder (HCC)   . Incontinence   . Mental disorder   . Depression   . Anxiety   . Blood transfusion 1970's  . Shortness of breath     has had a recent increase in episodes of shortness of breath and hyperventilating  . Stroke [facility]) 2009-2010    "have had 3 strokes"; denies residual  . BPH (benign prostatic hyperplasia)   . H/O acute renal failure 08/2011    severe w/hematuria  . Cocaine abuse    Past Surgical History  Procedure Laterality Date  . Incision and drainage of wound      right knee "got piece of wire in it while mowing"  . Inguinal hernia repair      left  . Tonsillectomy      "as a child"   Family History  Problem Relation Age of Onset  . Coronary artery disease Father    Social History  Substance Use Topics  . Smoking status: Former Smoker -- 1.00 packs/day for 18 years    Types: Cigarettes   Quit date: 07/30/1986  . Smokeless tobacco: Never Used  . Alcohol Use: 0.0 oz/week    0 Standard drinks or equivalent per week     Comment: "quit drinking ~ 2007"    Review of Systems  Unable to perform ROS: Patient nonverbal      Allergies  Lactose intolerance (gi); Lisinopril; and Codeine  Home Medications   Prior to Admission medications   Medication Sig Start Date End Date Taking? Authorizing Provider  aspirin EC 81 MG tablet Take 1 tablet (81 mg total) by mouth daily. Take for the next 3 months (April 14 - July 14) with Plavix 75 mg daily.  Then STOP Aspirin and take only Plavix. 11/15/15   Jana HalfNicholas A Taylor, MD  atorvastatin (LIPITOR) 40 MG tablet Take 1 tablet (40 mg total) by mouth daily at 6 PM. Patient taking differently: Take 40 mg by mouth daily.  11/15/15   Jana HalfNicholas A Taylor, MD  Cholecalciferol (VITAMIN D) 2000 units tablet Take 2,000 Units by mouth daily.    Historical Provider, MD  clopidogrel (PLAVIX) 75 MG tablet Take 1 tablet (75 mg total) by mouth daily. 11/15/15   Jana HalfNicholas A Taylor, MD  hydrophilic ointment Apply 1 application topically daily. Apply to both legs for dry kin    Historical Provider, MD  lisinopril (PRINIVIL,ZESTRIL) 40 MG tablet Take 1 tablet (40  mg total) by mouth daily. Patient not taking: Reported on 01/01/2016 11/15/15   Jana Half, MD  senna-docusate (SENOKOT-S) 8.6-50 MG tablet Take 1 tablet by mouth at bedtime as needed for mild constipation. Patient not taking: Reported on 01/01/2016 11/15/15   Jana Half, MD  verapamil (CALAN-SR) 240 MG CR tablet Take 1 tablet (240 mg total) by mouth at bedtime. Patient taking differently: Take 240 mg by mouth daily.  09/09/13   Dione Booze, MD   BP 103/85 mmHg  Pulse 111  Resp 18  SpO2 97% Physical Exam  Constitutional: He appears well-developed and well-nourished. No distress.  HENT:  Head: Normocephalic and atraumatic.  Neck: Normal range of motion. Neck supple.  Cardiovascular: Normal  rate, regular rhythm and normal heart sounds.   No murmur heard. Pulmonary/Chest: Effort normal and breath sounds normal. No respiratory distress. He has no wheezes. He has no rales.  Abdominal: Soft. Bowel sounds are normal. He exhibits no distension. There is no tenderness.  Musculoskeletal: Normal range of motion. He exhibits no edema.  Neurological: He is alert.  Patient is awake and alert, however is difficult to assess as he is nonverbal and does not follow commands.  Skin: Skin is warm and dry. He is not diaphoretic.  Nursing note and vitals reviewed.   ED Course  Procedures (including critical care time) Labs Review Labs Reviewed  COMPREHENSIVE METABOLIC PANEL  CBC WITH DIFFERENTIAL/PLATELET  URINALYSIS, ROUTINE W REFLEX MICROSCOPIC (NOT AT [facility])    Imaging Review No results found. I have personally reviewed and evaluated these images and lab results as part of my medical decision-making.   EKG Interpretation   Date/Time:  Friday January 06 2016 12:54:07 EDT Ventricular Rate:  123 PR Interval:  144 QRS Duration: 84 QT Interval:  333 QTC Calculation: 476 R Axis:   67 Text Interpretation:  Sinus tachycardia Ventricular premature complex  Abnormal T, consider ischemia, diffuse leads Confirmed by Kerwin Augustus  MD,  Jaidee Stipe ([id number]) on 01/06/2016 1:49:16 PM      MDM   Final diagnoses:  None    Patient sent from nursing home for evaluation of possible dehydration. According to the family and nursing home, the patient has not been eating or drinking. His workup today reveals dehydration with worsening renal function, hypernatremia. He also has evidence for urinary tract infection with a white count and grossly contaminated urine. He was given Rocephin and will be admitted to the hospitalist service for hydration, antibiotics, and further care. Dr. Dartha Lodge to accept.    Geoffery Lyons, MD 01/06/16 [phone]

## 2016-01-07 ENCOUNTER — Inpatient Hospital Stay (HOSPITAL_COMMUNITY): Payer: Commercial Managed Care - HMO

## 2016-01-07 DIAGNOSIS — R627 Adult failure to thrive: Secondary | ICD-10-CM | POA: Diagnosis present

## 2016-01-07 DIAGNOSIS — N178 Other acute kidney failure: Secondary | ICD-10-CM

## 2016-01-07 DIAGNOSIS — E860 Dehydration: Secondary | ICD-10-CM | POA: Diagnosis present

## 2016-01-07 LAB — SODIUM, URINE, RANDOM: Sodium, Ur: 94 mmol/L

## 2016-01-07 LAB — BASIC METABOLIC PANEL
Anion gap: 9 (ref 5–15)
BUN: 37 mg/dL — AB (ref 6–20)
CHLORIDE: 120 mmol/L — AB (ref 101–111)
CO2: 21 mmol/L — AB (ref 22–32)
CREATININE: 1.22 mg/dL (ref 0.61–1.24)
Calcium: 8.5 mg/dL — ABNORMAL LOW (ref 8.9–10.3)
GFR calc non Af Amer: >60 mL/min (ref >60)
Glucose, Bld: 96 mg/dL (ref 65–99)
POTASSIUM: 3.7 mmol/L (ref 3.5–5.1)
Sodium: 150 mmol/L — ABNORMAL HIGH (ref 135–145)

## 2016-01-07 LAB — CREATININE, URINE, RANDOM: Creatinine, Urine: 260.07 mg/dL

## 2016-01-07 LAB — PROTEIN, URINE, RANDOM: Total Protein, Urine: 33 mg/dL

## 2016-01-07 MED ORDER — LORAZEPAM 2 MG/ML IJ SOLN
1.0000 mg | Freq: Once | INTRAMUSCULAR | Status: AC
  Administered 2016-01-07: 1 mg via INTRAVENOUS
  Filled 2016-01-07: qty 1

## 2016-01-07 MED ORDER — SODIUM CHLORIDE 0.45 % IV SOLN
INTRAVENOUS | Status: DC
  Administered 2016-01-07 – 2016-01-11 (×5): via INTRAVENOUS

## 2016-01-07 MED ORDER — SODIUM CHLORIDE 0.9 % IV SOLN
INTRAVENOUS | Status: DC
  Administered 2016-01-07: 08:00:00 via INTRAVENOUS

## 2016-01-07 MED ORDER — AMLODIPINE BESYLATE 5 MG PO TABS
5.0000 mg | ORAL_TABLET | Freq: Every day | ORAL | Status: DC
  Filled 2016-01-07 (×2): qty 1

## 2016-01-07 NOTE — Evaluation (Signed)
Physical Therapy Evaluation Patient Details Name: Dominic Pineda MRN: [id number] DOB: Jan 24, 1951 Today's Date: 01/07/2016   History of Present Illness  65 yo male admitted from assisted living with  failure to thrive, dehydration and possible UTI  Past history includes a CVA in April of this year  Spoke with daughter, Dominic Pineda, who states that pt had been in [facility] until May 31 when he was [facility]. He was able to walk at times without a device and sometimes with a walker,  He has just started PT and OT.  Over this past week he has stopped eating and drinking. She hopes that pt will be discharged back to [facility]   Clinical Impression  Dominic Pineda was observed to move all 4 extremities against gravity and push strongly with left arm.  Feel that he should be able to perform functional mobility and gait when cognition improves and will be able to return to assisted living level [facility].  Will check back on Monday for progressive mobility , balance and gait training.     Follow Up Recommendations Home health PT;Supervision/Assistance - 24 hour    Equipment Recommendations  None recommended by PT    Recommendations for Other Services OT consult;Speech consult     Precautions / Restrictions Precautions Precautions: Fall Restrictions Weight Bearing Restrictions: No      Mobility  Bed Mobility Overal bed mobility: +2 for physical assistance             General bed mobility comments: pt seen to be strongly  pushing away with left arm   Transfers                 General transfer comment: did not test due to pt becoming more agitated with hands on treatment   Ambulation/Gait Ambulation/Gait assistance:  (not tested )              Stairs            Wheelchair Mobility    Modified Rankin (Stroke Patients Only)       Balance                                             Pertinent  Vitals/Pain Pain Assessment:  (unable to fully assess not grimacing)    Home Living Family/patient expects to be discharged to:: Assisted living (spoke with daughter, Radiah.  )               Home Equipment: Dan HumphreysWalker - 2 wheels Additional Comments: starting PT and OT     Prior Function Level of Independence: Needs assistance         Comments: daughter feels pleased with the amount of assistance provided [facility]      Hand Dominance        Extremity/Trunk Assessment   Upper Extremity Assessment: Defer to OT evaluation           Lower Extremity Assessment: Difficult to assess due to impaired cognition (appears to have WFL ROM with active random movements )         Communication   Communication: Other (comment) (no verbalizations, but did have eye contact )  Cognition Arousal/Alertness: Awake/alert Behavior During Therapy: Agitated Overall Cognitive Status: History of cognitive impairments - at baseline ([facility] unco[facility] )                      General Comments General comments (skin integrity, edema, etc.): IV in right hand that is secured with coban. condom catherter is off and lying in bed.  Per nurse tech, pt has removed condom cath several times last night     Exercises        Assessment/Plan    PT Assessment Patient needs continued PT services  PT Diagnosis Difficulty walking;Generalized weakness;Altered mental status   PT Problem List Decreased activity tolerance;Decreased mobility;Decreased cognition;Decreased safety awareness;Decreased knowledge of precautions  PT Treatment Interventions Gait training;Functional mobility training;Therapeutic activities;Therapeutic exercise;Balance training;Cognitive remediation;Patient/family education   PT Goals (Current goals can be found in the Care Plan section) Acute Rehab PT Goals Patient Stated Goal: for Dominic Pineda to return to [facility]  (for Dominic Pineda to  return to [facility] ) PT Goal Formulation: With patient/family Time For Goal Achievement: 01/21/16 Potential to Achieve Goals: Good    Frequency Min 2X/week   Barriers to discharge   daughter feels comfortable with level of care at ALF if pt can become more cooperative     Co-evaluation               End of Session   Activity Tolerance: Treatment limited secondary to agitation Patient left: in bed;with bed alarm set;with family/visitor present Nurse Communication: Other (comment) (family discharge plans )         Time: 0840-0905 PT Time Calculation (min) (ACUTE ONLY): 25 min   Charges:   PT Evaluation $PT Eval Low Complexity: 1 Procedure     PT G Codes:       Teresa K. Manson Passey, PT  Donnetta Hail 01/07/2016, 9:17 AM

## 2016-01-07 NOTE — Progress Notes (Signed)
PROGRESS NOTE    Dominic Pineda  ONG:[id number] DOB: Sep 16, 1950 DOA: 01/06/2016 PCP: PROVIDER NOT IN SYSTEM    Brief Narrative:  Dominic Pineda is a 65 year old gentleman who suffered a cerebrovascular accident in April 2017, had presented with slurred speech and facial droop, imaging revealing a left corona radiata infarct, and was discharged to inpatient rehabilitation. He was discharged on 11/28/2015 from inpatient rehabilitation to skilled nursing facility. [facility], transferred to the emergency department on 01/06/2016 having decreased by mouth intake and a functional decline. There was concern for dehydration. Initial lab work revealed an elevated creatinine of 1.91, white count of 18,300, urinalysis positive for bacteria, leukocytes and nitrates. He was given IV fluids and started on IV antibiotic therapy.   Assessment & Plan:   Active Problems:   Hypertension   CKD (chronic kidney disease)   UTI (lower urinary tract infection)   History of CVA (cerebrovascular accident)   Dysarthria, post-stroke   Acute renal failure (HCC)   Failure to thrive (0-17)   Dehydration   FTT (failure to thrive) in adult   1.  Failure to thrive -Dominic Pineda having a history of CVA in April 2017 -He was sent over from his facility for decreased by mouth intake and functional decline -On exam he appear dehydrated with lab work showing development of acute on chronic renal failure. Urinalysis revealed the presence of urinary tract infection. -It is possible that an infectious process like UTI may have precipitated functional decline.  -Having history of recent stroke, will also check an MRI of brain to ensure that he has not suffered a new stroke or extension of his prior stroke -Provide IV fluid resuscitation, empiric IV antibiotic therapy, supportive care  2.  History of CVA. -He was hospitalized in April 2017 for left corona radiata infarct -He had dysphasia post  CVA. -Case discussed with pathology, patient having difficulties participating with swallow evaluation -Will make him nothing by mouth for now and obtain an MRI of brain to insure he does not have new intracranial abnormalities -Continue Lipitor 40 mg by mouth daily and Plavix 75 mg by mouth daily  3.  Urinary tract infection. -Urinalysis showing many bacteria, leukocyte esterase, nitrates. -Plan to continue empiric IV antibiotic therapy with ceftriaxone -Follow-up on urine cultures.  4.  Acute on chronic renal failure -Lab work showing an upward trend in his creatinine 1.9 with BUN of 53. Prior to this he had a and 1.37 and BUN of 27 on 01/01/2016 -On physical examination he appears dehydrated. It appears he has had a functional decline with minimal by mouth intake. -Provide IV fluid resuscitation.  5.  Hypernatremia. -Lab work showing sodium of 154, likely secondary to dehydration -Receiving IV fluids  6.  Hypertension. -Continue amlodipine 5 mg by mouth daily  DVT prophylaxis: Subcutaneous heparin Code Status: Full code Family Communication: I spoke to his daughter was present at bedside Disposition Plan: Plan to obtain MRI of brain today, anticipate discharge to his facility when medically stable   Antimicrobials:   Ceftriaxone   Subjective: Dominic Pineda is nonverbal, uncooperative  Objective: Filed Vitals:   01/06/16 1429 01/06/16 1602 01/06/16 2100 01/07/16 0644  BP: 143/86 131/64 144/75 149/76  Pulse: 102 84 85 90  Temp:  98 F (36.7 C) 97.1 F (36.2 C) 98.8 F (37.1 C)  TempSrc:  Oral Axillary Axillary  Resp: 18 20 18 20   SpO2: 98% 99% 99% 97%    Intake/Output Summary (Last  24 hours) at 01/07/16 1224 Last data filed at 01/07/16 0524  Gross per 24 hour  Intake  26.67 ml  Output    150 ml  Net -123.33 ml   There were no vitals filed for this visit.  Examination:  General exam: Nonverbal, Respiratory system: Clear to auscultation. Respiratory effort  normal. Cardiovascular system: S1 & S2 heard, RRR. No JVD, murmurs, rubs, gallops or clicks. No pedal edema. Gastrointestinal system: Abdomen is nondistended, soft and nontender. No organomegaly or masses felt. Normal bowel sounds heard. Central nervous system: It was difficult to perform neurologic examination given uncooperativeness, he is nonverbal. He appear to have 5 out of 5 muscle strength to all 4 extremities. Facial droop is present. Extremities: Symmetric 5 x 5 power. Skin: No rashes, lesions or ulcers Psychiatry:     Data Reviewed: I have personally reviewed following labs and imaging studies  CBC:  Recent Labs Lab 01/01/16 1543 01/06/16 1253  WBC 14.0* 18.3*  NEUTROABS 6.6 8.8*  HGB 13.9 15.4  HCT 42.8 47.6  MCV 87.2 90.7  PLT 247 260   Basic Metabolic Panel:  Recent Labs Lab 01/01/16 1543 01/06/16 1253  NA 145 154*  K 4.5 4.0  CL 112* 120*  CO2 21* 22  GLUCOSE 106* 128*  BUN 27* 53*  CREATININE 1.37* 1.91*  CALCIUM 9.6 9.5   GFR: CrCl cannot be calculated (Unknown ideal weight.). Liver Function Tests:  Recent Labs Lab 01/06/16 1253  AST 22  ALT 21  ALKPHOS 100  BILITOT 3.2*  PROT 8.2*  ALBUMIN 4.6   No results for input(s): LIPASE, AMYLASE in the last 168 hours. No results for input(s): AMMONIA in the last 168 hours. Coagulation Profile: No results for input(s): INR, PROTIME in the last 168 hours. Cardiac Enzymes: No results for input(s): CKTOTAL, CKMB, CKMBINDEX, TROPONINI in the last 168 hours. BNP (last 3 results) No results for input(s): PROBNP in the last 8760 hours. HbA1C: No results for input(s): HGBA1C in the last 72 hours. CBG:  Recent Labs Lab 01/06/16 1224  GLUCAP 116*   Lipid Profile: No results for input(s): CHOL, HDL, LDLCALC, TRIG, CHOLHDL, LDLDIRECT in the last 72 hours. Thyroid Function Tests: No results for input(s): TSH, T4TOTAL, FREET4, T3FREE, THYROIDAB in the last 72 hours. Anemia Panel: No results for  input(s): VITAMINB12, FOLATE, FERRITIN, TIBC, IRON, RETICCTPCT in the last 72 hours. Sepsis Labs: No results for input(s): PROCALCITON, LATICACIDVEN in the last 168 hours.  No results found for this or any previous visit (from the past 240 hour(s)).       Radiology Studies: US Renal  01/06/2016  CLINICAL DATA:  65 year old male with acute renal failure EXAM: RENAL / URINARY TRACT ULTRASOUND COMPLETE COMPARISON:  Renal ultrasound dated 09/25/2011 FINDINGS: Right Kidney: Length: 10.6 cm. There is mild diffuse renal cortical thinning. No hydronephrosis or echogenic stone. Left Kidney: Length: 10.9 cm. There is slight increased echogenicity with diffuse cortical thinning. No hydronephrosis or echogenic stone. Bladder: The urinary bladder is only partially distended. There is thickened appearance of the posterior wall of the urinary bladder which may be related to underdistention or secondary to chronic bladder outlet obstruction versus layering debris within the urinary bladder. The prostate gland is enlarged and heterogeneous measuring 4.9 x 4.8 x 4.8 cm in transverse dimension. There is apparent median lobe hypertrophy indenting the base of the bladder. IMPRESSION: No hydronephrosis or echogenic stone. Mild renal parenchyma atrophy. Enlarged prostate gland with median lobe hypertrophy. Thickened appearance of the posterior wall  of the urinary bladder. Electronically Signed   By: Elgie Collard M.D.   On: 01/06/2016 21:50   Dg Chest Port 1 View  01/06/2016  CLINICAL DATA:  Weakness.  History of stroke. EXAM: PORTABLE CHEST 1 VIEW COMPARISON:  11/25/2015 FINDINGS: Cardiomediastinal silhouette is normal. Mediastinal contours appear intact. There is no evidence of focal airspace consolidation, pleural effusion or pneumothorax. There is chronic elevation of the right hemidiaphragm and associated right lower lobe atelectasis. Osseous structures are without acute abnormality. Soft tissues are grossly normal.  IMPRESSION: No active disease. Chronic right lower lobe atelectasis with elevation of the right hemidiaphragm. Electronically Signed   By: Ted Mcalpine M.D.   On: 01/06/2016 13:27        Scheduled Meds: . aspirin EC  81 mg Oral Daily  . atorvastatin  40 mg Oral Daily  . cefTRIAXone (ROCEPHIN)  IV  1 g Intravenous Q24H  . clopidogrel  75 mg Oral Daily  . heparin  5,000 Units Subcutaneous Q8H  . mineral oil-hydrophilic petrolatum   Topical Daily   Continuous Infusions: . sodium chloride 75 mL/hr at 01/07/16 0750     LOS: 1 day    Time spent: 35 min    Jeralyn Bennett, MD [facility] Pager [phone]  If 7PM-7AM, please contact night-coverage [email or web address] Password [facility] 01/07/2016, 12:24 PM

## 2016-01-07 NOTE — Evaluation (Signed)
Clinical/Bedside Swallow Evaluation Patient Details  Name: Dominic Pineda MRN: [id number] Date of Birth: 1950-12-27  Today's Date: 01/07/2016 Time: SLP Start Time (ACUTE ONLY): 1040 SLP Stop Time (ACUTE ONLY): 1105 SLP Time Calculation (min) (ACUTE ONLY): 25 min  Past Medical History:  Past Medical History  Diagnosis Date  . Hypertension   . Bipolar 1 disorder (HCC)   . Incontinence   . Mental disorder   . Depression   . Anxiety   . Blood transfusion 1970's  . Shortness of breath     has had a recent increase in episodes of shortness of breath and hyperventilating  . Stroke [facility](HCC) 2009-2010    "have had 3 strokes"; denies residual  . BPH (benign prostatic hyperplasia)   . H/O acute renal failure 08/2011    severe w/hematuria  . Cocaine abuse    Past Surgical History:  Past Surgical History  Procedure Laterality Date  . Incision and drainage of wound      right knee "got piece of wire in it while mowing"  . Inguinal hernia repair      left  . Tonsillectomy      "as a child"   HPI:  Patient is a 65 y.o. male who was admitted to the ER on 6/9 with decreased PO intake and overall functional decline, as well as concern for dehydration. PMH: CVA in April 2017 and recently moved to a SNF from inpatient rehab   Assessment / Plan / Recommendation Clinical Impression  Patient exhibits a primary cognitive-based dysphgia at this time, although suspect he does have a sensory and motor based dysphagia as well, given  his h/o CVA. Patient did not initaite swallow or even manipulate and move boluses of puree and small piece of cracker. When allowed to hold cup (with SLP supporting), he would bring to mouth, start to tip back, and then move cup away without taking a sip.     Aspiration Risk  Severe aspiration risk;Risk for inadequate nutrition/hydration    Diet Recommendation Alternative means - temporary   Medication Administration: Via alternative means    Other  Recommendations      Follow up Recommendations    SLP will check for readiness of ability to accept PO's.    Frequency and Duration            Prognosis Prognosis for Safe Diet Advancement: Fair Barriers to Reach Goals: Cognitive deficits;Behavior      Swallow Study   General Date of Onset: 01/06/16 HPI: Patient is a 65 y.o. male who was admitted to the ER on 6/9 with decreased PO intake and overall functional decline, as well as concern for dehydration. PMH: CVA in April 2017 and recently moved to a SNF from inpatient rehab Type of Study: Bedside Swallow Evaluation Previous Swallow Assessment: N/A Diet Prior to this Study: Regular;Thin liquids Temperature Spikes Noted: No History of Recent Intubation: No Behavior/Cognition: Alert;Confused;Uncooperative;Requires cueing;Doesn't follow directions Oral Care Completed by SLP: Yes Oral Cavity - Dentition: Missing dentition Self-Feeding Abilities: Total assist Patient Positioning: Upright in bed Baseline Vocal Quality:  (difficult to assess, mostly just moans and groans, non-verbal) Volitional Cough: Cognitively unable to elicit Volitional Swallow: Unable to elicit    Oral/Motor/Sensory Function Overall Oral Motor/Sensory Function: Other (comment) (patient did not allow for full oral-motor assessment)   Ice Chips     Thin Liquid Thin Liquid:  (patient refused/would not accept )    Nectar Thick     Honey Thick  Puree Puree: Impaired Oral Phase Impairments: Poor awareness of bolus;Reduced lingual movement/coordination Other Comments: Patient kept majority of puree bolus in front of mouth and did not make any attempts to manipulate or move bolus. SLP cleaned out residuals in oral cavity with toothette sponge   Solid   GO   Solid: Impaired Oral Phase Impairments: Poor awareness of bolus;Impaired mastication;Reduced lingual movement/coordination Other Comments: Patient did bite down on cracker, however made no further attemtps to masticate or  manipulate bolus.        Elio Forget Tarrell 01/07/2016,4:41 PM    Angela Nevin, MA, CCC-SLP 01/07/2016 4:42 PM

## 2016-01-08 ENCOUNTER — Inpatient Hospital Stay (HOSPITAL_COMMUNITY): Payer: Commercial Managed Care - HMO

## 2016-01-08 ENCOUNTER — Inpatient Hospital Stay: Payer: Commercial Managed Care - HMO

## 2016-01-08 DIAGNOSIS — I639 Cerebral infarction, unspecified: Secondary | ICD-10-CM | POA: Diagnosis present

## 2016-01-08 DIAGNOSIS — I63412 Cerebral infarction due to embolism of left middle cerebral artery: Principal | ICD-10-CM

## 2016-01-08 LAB — CBC
HCT: 39.4 % (ref 39.0–52.0)
Hemoglobin: 13.1 g/dL (ref 13.0–17.0)
MCH: 29.7 pg (ref 26.0–34.0)
MCHC: 33.2 g/dL (ref 30.0–36.0)
MCV: 89.3 fL (ref 78.0–100.0)
PLATELETS: 173 10*3/uL (ref 150–400)
RBC: 4.41 MIL/uL (ref 4.22–5.81)
RDW: 15.6 % — AB (ref 11.5–15.5)
WBC: 10.4 10*3/uL (ref 4.0–10.5)

## 2016-01-08 LAB — BASIC METABOLIC PANEL WITH GFR
Anion gap: 8 (ref 5–15)
BUN: 31 mg/dL — ABNORMAL HIGH (ref 6–20)
CO2: 23 mmol/L (ref 22–32)
Calcium: 8.4 mg/dL — ABNORMAL LOW (ref 8.9–10.3)
Chloride: 118 mmol/L — ABNORMAL HIGH (ref 101–111)
Creatinine, Ser: 1.17 mg/dL (ref 0.61–1.24)
GFR calc Af Amer: >60 mL/min
GFR calc non Af Amer: >60 mL/min
Glucose, Bld: 97 mg/dL (ref 65–99)
Potassium: 3.9 mmol/L (ref 3.5–5.1)
Sodium: 149 mmol/L — ABNORMAL HIGH (ref 135–145)

## 2016-01-08 MED ORDER — LORAZEPAM 2 MG/ML IJ SOLN
1.0000 mg | Freq: Once | INTRAMUSCULAR | Status: AC
  Administered 2016-01-08: 1 mg via INTRAVENOUS
  Filled 2016-01-08: qty 1

## 2016-01-08 MED ORDER — STROKE: EARLY STAGES OF RECOVERY BOOK
Freq: Once | Status: DC
  Filled 2016-01-08: qty 1

## 2016-01-08 MED ORDER — ASPIRIN EC 325 MG PO TBEC: 325.0000 mg | DELAYED_RELEASE_TABLET | Freq: Every day | ORAL | Status: DC

## 2016-01-08 MED ORDER — ASPIRIN 300 MG RE SUPP
300.0000 mg | Freq: Every day | RECTAL | Status: DC
  Administered 2016-01-09: 300 mg via RECTAL
  Filled 2016-01-08: qty 1

## 2016-01-08 MED ORDER — SENNOSIDES-DOCUSATE SODIUM 8.6-50 MG PO TABS: 1.0000 | ORAL_TABLET | Freq: Every evening | ORAL | Status: DC | PRN

## 2016-01-08 MED ORDER — SODIUM CHLORIDE 0.9 % IV SOLN
2000.0000 mg | Freq: Once | INTRAVENOUS | Status: AC
  Administered 2016-01-08: 2000 mg via INTRAVENOUS
  Filled 2016-01-08: qty 2000

## 2016-01-08 MED ORDER — GADOBENATE DIMEGLUMINE 529 MG/ML IV SOLN
20.0000 mL | Freq: Once | INTRAVENOUS | Status: AC | PRN
  Administered 2016-01-08: 20 mL via INTRAVENOUS

## 2016-01-08 MED ORDER — VANCOMYCIN HCL 10 G IV SOLR
1500.0000 mg | INTRAVENOUS | Status: DC
  Filled 2016-01-08: qty 1500

## 2016-01-08 MED ORDER — ENOXAPARIN SODIUM 40 MG/0.4ML ~~LOC~~ SOLN
40.0000 mg | SUBCUTANEOUS | Status: DC
  Administered 2016-01-08 – 2016-01-10 (×3): 40 mg via SUBCUTANEOUS
  Filled 2016-01-08 (×4): qty 0.4

## 2016-01-08 NOTE — Progress Notes (Signed)
OT Cancellation Note  Patient Details Name: Dominic Pineda MRN: [id number] DOB: 1951/03/09   Cancelled Treatment:    Reason Eval/Treat Not Completed: Other (comment).  Spoke to daughter.  Pt is not following commands at this time. He was participating in ADLs at times but has assistance [facility].  He was also receiving OT/PT/ST there. Will check back and will see him in acute if he follows commands.  Verta Riedlinger 01/08/2016, 12:06 PM  Marica OtterMaryellen Cleavon Goldman, OTR/L [phone] 01/08/2016

## 2016-01-08 NOTE — Progress Notes (Signed)
Family updated on diagnosis and transfer to [facility], I spoke with Gordy CouncilmanDennesha Mebane his POA at [phone]

## 2016-01-08 NOTE — Consult Note (Signed)
Neurology Consultation Reason for Consult: Stroke Referring Physician: Jeralyn BennettZamora, Dominic  CC: Aphasia  History is obtained from: Chart  HPI: Dominic Pineda is a 65 y.o. male with unclear time of onset who is not doing well for several days prior to being transferred here from his nursing home. On arrival, he was found to have hyponatremia, elevated creatinine and suggestion of youth TIA. It is felt by the internal medicine team, however, that his altered mental status seemed out of proportion to his medical problems and therefore an MRI was ordered which was performed today and shows ischemic infarct in the left parietal region.   LKW: Unclear tpa given?: no, unclear time of onset    ROS:  Unable to obtain due to altered mental status.   Past Medical History  Diagnosis Date  . Hypertension   . Bipolar 1 disorder (HCC)   . Incontinence   . Mental disorder   . Depression   . Anxiety   . Blood transfusion 1970's  . Shortness of breath     has had a recent increase in episodes of shortness of breath and hyperventilating  . Stroke [facility](HCC) 2009-2010    "have had 3 strokes"; denies residual  . BPH (benign prostatic hyperplasia)   . H/O acute renal failure 08/2011    severe w/hematuria  . Cocaine abuse      Family History  Problem Relation Age of Onset  . Coronary artery disease Father      Social History:  reports that he quit smoking about 29 years ago. His smoking use included Cigarettes. He has a 18 pack-year smoking history. He has never used smokeless tobacco. He reports that he drinks alcohol. He reports that he does not use illicit drugs.   Exam: Current vital signs: BP 176/89 mmHg  Pulse 82  Temp(Src) 98.1 F (36.7 C) (Oral)  Resp 20  SpO2 92% Vital signs in last 24 hours: Temp:  [98.1 F (36.7 C)-98.8 F (37.1 C)] 98.1 F (36.7 C) (06/11 1354) Pulse Rate:  [73-84] 82 (06/11 1354) Resp:  [18-20] 20 (06/11 1354) BP: (152-176)/(73-89) 176/89 mmHg (06/11  1354) SpO2:  [92 %-98 %] 92 % (06/11 1354)   Physical Exam  Constitutional: Appears well-developed and well-nourished.  Psych: Does not interact Eyes: No scleral injection HENT: No OP obstrucion Head: Normocephalic.  Cardiovascular: Normal rate and regular rhythm.  Respiratory: Effort normal GI: Soft.  No distension. There is no tenderness.  Skin: WDI  Neuro: Mental Status: Patient opens eyes slightly, but keeps eyes closed for most of the exam. He does not follow commands or speak. Cranial Nerves: II: Does not clearly blink to threat from the right Pupils are equal, round, and reactive to light.   III,IV, VI: He does appear cross midline in both directions but does not look fully to either side. V: Response to noxious stim ablation bilaterally VII: Facial movement is difficult to judge, but no gross abnormalities X: Palate elevates with feeling symmetrically Motor: He has a right hemiparesis, but does move both his right arm and some, leg more so than arm. He moves his left side briskly Sensory: He does have some response to noxious stimulus in all 4 extremities Cerebellar: Does not comply   I have reviewed labs in epic and the results pertinent to this consultation are: Sodium 149  I have reviewed the images obtained: MRI brain left parietal infarct  Impression: 65 year old male with infarct and left parietal region, I suspect embolic source. He has  multiple old infarcts as well. His difficulty with swallowing is concerning, and he appears to have a fairly dense aphasia.  Recommendations: 1. HgbA1c, fasting lipid panel 2. MRI, MRA  of the brain without contrast 3. Frequent neuro checks 4. Echocardiogram 5. Carotid dopplers 6. Prophylactic continue dual antiplatelet therapy 7. Risk factor modification 8. Telemetry monitoring 9. PT consult, OT consult, Speech consult 10. please page stroke NP  Or  PA  Or MD  M-F from 8am -4 pm starting 6/12 as this patient will be  followed by the stroke team at this point.   You can look them up on [email or web address]      Ritta Slot, MD [facility] [phone]  If 7pm- 7am, please page neurology on call as listed in AMION.

## 2016-01-08 NOTE — Progress Notes (Signed)
Notified physician that patient had 7 beats Vtach at [phone] per central telemetry.  Patient asymptomatic.  Blood was being drawn by lab at the time.  Patient now NSR 80.

## 2016-01-08 NOTE — Progress Notes (Signed)
[facility] to [place] [facility].  Patient being transferred to [address] via [facility].

## 2016-01-08 NOTE — Progress Notes (Signed)
Gave report to Pilgrim's PrideBrooke with [facility].  Transportation in route.

## 2016-01-08 NOTE — Progress Notes (Signed)
PROGRESS NOTE    Dominic Pineda  VHQ:[id number] DOB: 19-May-1951 DOA: 01/06/2016 PCP: PROVIDER NOT IN SYSTEM    Brief Narrative:  Dominic. Trella is a 65 year old gentleman who suffered a cerebrovascular accident in April 2017, had presented with slurred speech and facial droop, imaging revealing a left corona radiata infarct, and was discharged to inpatient rehabilitation. He was discharged on 11/28/2015 from inpatient rehabilitation to skilled nursing facility. Recently he had been residing at [facility], transferred to the emergency department on 01/06/2016 having decreased by mouth intake and a functional decline. There was concern for dehydration. Initial lab work revealed an elevated creatinine of 1.91, white count of 18,300, urinalysis positive for bacteria, leukocytes and nitrates. He was given IV fluids and started on IV antibiotic therapy.   Assessment & Plan:   Active Problems:   Hypertension   CKD (chronic kidney disease)   UTI (lower urinary tract infection)   History of CVA (cerebrovascular accident)   Dysarthria, post-stroke   Acute renal failure (HCC)   Failure to thrive (0-17)   Dehydration   FTT (failure to thrive) in adult   1.  Failure to thrive/Acute encephalopthy -Dominic Pineda having a history of CVA in April 2017 -He was sent over from his facility for decreased by mouth intake and functional decline -On exam he appear dehydrated with lab work showing development of acute on chronic renal failure. Urinalysis revealed the presence of urinary tract infection. -It is possible that an infectious process like UTI may have precipitated functional decline.  -Having history of recent stroke, will also check an MRI of brain to ensure that he has not suffered a new stroke or extension of his prior stroke -Unfortunately he was unable to tolerate MRI. A stat CT of head did not reveal acute intracranial bleed. Radiology did report however, that there may be findings compatible  with cerebritis.  -I discussed CT findings with DR Amada Jupiter of Neurology who felt this may actually be more consistent with CVA rather than infection.  -Will attempt repeat MRI of brain today with IV ativan for sedation.    2.  History of CVA. -He was hospitalized in April 2017 for left corona radiata infarct -He had dysphasia post CVA. -Case discussed with speech pathology, patient having difficulties participating with swallow evaluation -Will make him nothing by mouth for now and obtain an MRI of brain to insure he does not have new intracranial abnormalities -As outlined above, Neurology thinks that CT findings may be related to acute CVA rather than cerebritis.  -Plan to further work up with MRI of brain with IV ativan for sedation -Neurology consulted  3.  Urinary tract infection. -Urinalysis showing many bacteria, leukocyte esterase, nitrates. -He had been on empiric IV antibiotic therapy with ceftriaxone -Urine cultures growing Staph aureus, will change antibiotic coverage to Vancomycin until susceptibility testing available.   4.  Acute on chronic renal failure -Lab work showing an upward trend in his creatinine 1.9 with BUN of 53. Prior to this he had a and 1.37 and BUN of 27 on 01/01/2016 -On physical examination he appears dehydrated. It appears he has had a functional decline with minimal by mouth intake. -Repeat labs showing Cr of 1.17 with BUN of 31 -He remains NPO will continue IV fluids  5.  Hypernatremia. -Lab work showing sodium of 154, likely secondary to dehydration -Receiving IV fluids with 1/2 NS  6.  Hypertension. -Continue amlodipine 5 mg by mouth daily  DVT prophylaxis: Subcutaneous heparin Code Status:  Full code Family Communication: I spoke to his daughter was present at bedside Disposition Plan: Plan to obtain MRI of brain today, anticipate discharge to his facility when medically stable   Antimicrobials:   Ceftriaxone   Subjective: Dominic Pineda  is nonverbal, uncooperative  Objective: Filed Vitals:   01/07/16 0644 01/07/16 1417 01/07/16 2108 01/08/16 0651  BP: 149/76 152/77 159/81 156/73  Pulse: 90 84 78 73  Temp: 98.8 F (37.1 C)  98.8 F (37.1 C) 98.8 F (37.1 C)  TempSrc: Axillary  Oral Axillary  Resp: 20 18 19 19   SpO2: 97% 97% 98% 96%    Intake/Output Summary (Last 24 hours) at 01/08/16 1138 Last data filed at 01/08/16 0900  Gross per 24 hour  Intake      0 ml  Output    400 ml  Net   -400 ml   There were no vitals filed for this visit.  Examination:  General exam: Nonverbal, Respiratory system: Clear to auscultation. Respiratory effort normal. Cardiovascular system: S1 & S2 heard, RRR. No JVD, murmurs, rubs, gallops or clicks. No pedal edema. Gastrointestinal system: Abdomen is nondistended, soft and nontender. No organomegaly or masses felt. Normal bowel sounds heard. Central nervous system: It was difficult to perform neurologic examination given uncooperativeness, he is nonverbal. He appear to have 5 out of 5 muscle strength to all 4 extremities. Facial droop is present. Extremities: Symmetric 5 x 5 power. Skin: No rashes, lesions or ulcers Psychiatry:     Data Reviewed: I have personally reviewed following labs and imaging studies  CBC:  Recent Labs Lab 01/01/16 1543 01/06/16 1253 01/08/16 0727  WBC 14.0* 18.3* 10.4  NEUTROABS 6.6 8.8*  --   HGB 13.9 15.4 13.1  HCT 42.8 47.6 39.4  MCV 87.2 90.7 89.3  PLT 247 260 173   Basic Metabolic Panel:  Recent Labs Lab 01/01/16 1543 01/06/16 1253 01/07/16 1700 01/08/16 0727  NA 145 154* 150* 149*  K 4.5 4.0 3.7 3.9  CL 112* 120* 120* 118*  CO2 21* 22 21* 23  GLUCOSE 106* 128* 96 97  BUN 27* 53* 37* 31*  CREATININE 1.37* 1.91* 1.22 1.17  CALCIUM 9.6 9.5 8.5* 8.4*   GFR: CrCl cannot be calculated (Unknown ideal weight.). Liver Function Tests:  Recent Labs Lab 01/06/16 1253  AST 22  ALT 21  ALKPHOS 100  BILITOT 3.2*  PROT 8.2*    ALBUMIN 4.6   No results for input(s): LIPASE, AMYLASE in the last 168 hours. No results for input(s): AMMONIA in the last 168 hours. Coagulation Profile: No results for input(s): INR, PROTIME in the last 168 hours. Cardiac Enzymes: No results for input(s): CKTOTAL, CKMB, CKMBINDEX, TROPONINI in the last 168 hours. BNP (last 3 results) No results for input(s): PROBNP in the last 8760 hours. HbA1C: No results for input(s): HGBA1C in the last 72 hours. CBG:  Recent Labs Lab 01/06/16 1224  GLUCAP 116*   Lipid Profile: No results for input(s): CHOL, HDL, LDLCALC, TRIG, CHOLHDL, LDLDIRECT in the last 72 hours. Thyroid Function Tests: No results for input(s): TSH, T4TOTAL, FREET4, T3FREE, THYROIDAB in the last 72 hours. Anemia Panel: No results for input(s): VITAMINB12, FOLATE, FERRITIN, TIBC, IRON, RETICCTPCT in the last 72 hours. Sepsis Labs: No results for input(s): PROCALCITON, LATICACIDVEN in the last 168 hours.  Recent Results (from the past 240 hour(s))  Urine culture     Status: Abnormal (Preliminary result)   Collection Time: 01/06/16 10:52 PM  Result Value Ref Range Status  Specimen Description URINE, RANDOM  Final   Special Requests NONE  Final   Culture >=100,000 COLONIES/mL STAPHYLOCOCCUS AUREUS (A)  Final   Report Status PENDING  Incomplete         Radiology Studies: Ct Head Wo Contrast  01/07/2016  CLINICAL DATA:  Altered mental status, acute encephalopathy l EXAM: CT HEAD WITHOUT CONTRAST TECHNIQUE: Contiguous axial images were obtained from the base of the skull through the vertex without intravenous contrast. COMPARISON:  11/24/2015 FINDINGS: Diffuse atrophy with stable ventricular dilatation. MCA infarct/ encephalomalacia on the right. No hemorrhage or extra-axial fluid. New low-attenuation in the subcortical white matter of the left posterior parietal lobe. Calvarium intact. IMPRESSION: Chronic findings. Additionally,low-attenuation in the subcortical  white matter posterior left parietal lobe. Findings are nonspecific but suggest edema related to cerebritis. Infarction or mass considered less likely. Further evaluation with MRI suggested. Electronically Signed   By: Esperanza Heir M.D.   On: 01/07/2016 21:03   US Renal  01/06/2016  CLINICAL DATA:  65 year old male with acute renal failure EXAM: RENAL / URINARY TRACT ULTRASOUND COMPLETE COMPARISON:  Renal ultrasound dated 09/25/2011 FINDINGS: Right Kidney: Length: 10.6 cm. There is mild diffuse renal cortical thinning. No hydronephrosis or echogenic stone. Left Kidney: Length: 10.9 cm. There is slight increased echogenicity with diffuse cortical thinning. No hydronephrosis or echogenic stone. Bladder: The urinary bladder is only partially distended. There is thickened appearance of the posterior wall of the urinary bladder which may be related to underdistention or secondary to chronic bladder outlet obstruction versus layering debris within the urinary bladder. The prostate gland is enlarged and heterogeneous measuring 4.9 x 4.8 x 4.8 cm in transverse dimension. There is apparent median lobe hypertrophy indenting the base of the bladder. IMPRESSION: No hydronephrosis or echogenic stone. Mild renal parenchyma atrophy. Enlarged prostate gland with median lobe hypertrophy. Thickened appearance of the posterior wall of the urinary bladder. Electronically Signed   By: Elgie Collard M.D.   On: 01/06/2016 21:50   Dg Chest Port 1 View  01/06/2016  CLINICAL DATA:  Weakness.  History of stroke. EXAM: PORTABLE CHEST 1 VIEW COMPARISON:  11/25/2015 FINDINGS: Cardiomediastinal silhouette is normal. Mediastinal contours appear intact. There is no evidence of focal airspace consolidation, pleural effusion or pneumothorax. There is chronic elevation of the right hemidiaphragm and associated right lower lobe atelectasis. Osseous structures are without acute abnormality. Soft tissues are grossly normal. IMPRESSION: No  active disease. Chronic right lower lobe atelectasis with elevation of the right hemidiaphragm. Electronically Signed   By: Ted Mcalpine M.D.   On: 01/06/2016 13:27        Scheduled Meds: . amLODipine  5 mg Oral Daily  . aspirin EC  81 mg Oral Daily  . atorvastatin  40 mg Oral Daily  . cefTRIAXone (ROCEPHIN)  IV  1 g Intravenous Q24H  . clopidogrel  75 mg Oral Daily  . heparin  5,000 Units Subcutaneous Q8H  . LORazepam  1 mg Intravenous Once  . mineral oil-hydrophilic petrolatum   Topical Daily   Continuous Infusions: . sodium chloride 100 mL/hr at 01/07/16 1818     LOS: 2 days    Time spent: 35 min    Jeralyn Bennett, MD [facility] Pager [phone]  If 7PM-7AM, please contact night-coverage [email or web address] Password [facility] 01/08/2016, 11:38 AM

## 2016-01-08 NOTE — Progress Notes (Signed)
[facility]  IV infiltrated and removed prior to transportation.  [facility] starting new IV.  Patient on room air.  No s/s of pain or distress.  Called and spoke to patient daughter, Hurshel PartyRadiah Mallard, [phone] to inform her of his transporting at this time.  Unable to reach [facility], daughter, at [phone].

## 2016-01-08 NOTE — Progress Notes (Signed)
Pharmacy Antibiotic Note  Dominic Pineda is a 65 y.o. male admitted on 01/06/2016 with UTI.  He was admitted 6/9 from NH with AMS and FTT over previous week.  Rocephin was started for UTI.  He appears to be improving (WBC 18.3-->10.4, Afebrile), however urine cx now +SA.  Pharmacy has been consulted for Vancomycin dosing pending sensitivities.   Daughter reports no use of urinary catheters, etc PTA, currently has condom catheter.  As SA not typical urinary pathogen, blood cx has been sent.   Renal fxn has improved with hydration.  NCrCl~ 5960ml/min.   Plan: Vancomycin 2g IV x1 now then, Vancomycin 1500 IV every 24 hours.  Goal trough 10-15 mcg/mL.  Check Vancomycin trough at steady state Monitor renal function and cx data F/U work-up to identify source of infection      Unable to weigh patient- last recorded wt from April 2017 ~ 125kg.  RN and daughter estimate this is still accurate.    Temp (24hrs), Avg:98.8 F (37.1 C), Min:98.8 F (37.1 C), Max:98.8 F (37.1 C)   Recent Labs Lab 01/01/16 1543 01/06/16 1253 01/07/16 1700 01/08/16 0727  WBC 14.0* 18.3*  --  10.4  CREATININE 1.37* 1.91* 1.22 1.17    CrCl cannot be calculated (Unknown ideal weight.).    Allergies  Allergen Reactions  . Lactose Intolerance (Gi) Diarrhea    Stomach pain  . Lisinopril Other (See Comments)    angioedema  . Codeine Nausea Only    Antimicrobials this admission: Rocephin 6/9 >> 6/11 Vanc 6/11 >>   Dose adjustments this admission:  Microbiology results: 6/11 BCx: sent 6/9 UCx: >100K SA   Thank you for allowing pharmacy to be a part of this patient's care.  Junita PushMichelle Kitzia Camus, PharmD, BCPS Pager: [phone] 01/08/2016 11:52 AM

## 2016-01-08 NOTE — Progress Notes (Signed)
Transfer Summary  [name] is a 65 year old gentleman with a past medical history of CVA in April 2017 involving left corona radiata, admitted to the medicine service on 01/06/2016 when he presented with failure to thrive, mental status changes, aphasia. Initial workup revealed acute kidney injury along with urinary tract infection. He was treated with IV fluids and IV antibiotic therapy. On 01/07/2016 ordered an MRI of brain to assess for possibility of CVA precipitating symptoms, having a recent history of left carotid radiata infarct in April 2017. Unfortunately did not tolerate MRI, after which admittedly ordered a CT scan of brain showing sure he did not have a head bleed. The study came back negative however radiology remarked on the possibility of cerebritis. I repeated MRI of brain with sedation showing subacute left MCA branch vessel territory infarction. During this time he had been on Plavix 75 mg by mouth daily. Dr. Amada JupiterKirkpatrick of neurology was consulted. He was suspicious that this represented a thromboembolic phenomena and recommended transesophageal echocardiogram. He also recommended [facility] be assessed by the stroke team.  Will place patient on CVA protocol Case was discussed with Dr.Vann who accepted patient in transfer Will consult cardiology for TEE.

## 2016-01-09 ENCOUNTER — Inpatient Hospital Stay (HOSPITAL_COMMUNITY): Payer: Commercial Managed Care - HMO

## 2016-01-09 DIAGNOSIS — I63032 Cerebral infarction due to thrombosis of left carotid artery: Secondary | ICD-10-CM

## 2016-01-09 DIAGNOSIS — Z8673 Personal history of transient ischemic attack (TIA), and cerebral infarction without residual deficits: Secondary | ICD-10-CM

## 2016-01-09 DIAGNOSIS — G9340 Encephalopathy, unspecified: Secondary | ICD-10-CM | POA: Diagnosis present

## 2016-01-09 DIAGNOSIS — L8990 Pressure ulcer of unspecified site, unspecified stage: Secondary | ICD-10-CM | POA: Insufficient documentation

## 2016-01-09 DIAGNOSIS — N390 Urinary tract infection, site not specified: Secondary | ICD-10-CM

## 2016-01-09 DIAGNOSIS — I10 Essential (primary) hypertension: Secondary | ICD-10-CM

## 2016-01-09 DIAGNOSIS — I69322 Dysarthria following cerebral infarction: Secondary | ICD-10-CM

## 2016-01-09 LAB — BASIC METABOLIC PANEL
ANION GAP: 9 (ref 5–15)
BUN: 17 mg/dL (ref 6–20)
CALCIUM: 8.5 mg/dL — AB (ref 8.9–10.3)
CO2: 22 mmol/L (ref 22–32)
Chloride: 114 mmol/L — ABNORMAL HIGH (ref 101–111)
Creatinine, Ser: 1.04 mg/dL (ref 0.61–1.24)
Glucose, Bld: 83 mg/dL (ref 65–99)
POTASSIUM: 3.4 mmol/L — AB (ref 3.5–5.1)
Sodium: 145 mmol/L (ref 135–145)

## 2016-01-09 LAB — URINE CULTURE: Culture: >=100000 — AB

## 2016-01-09 LAB — CBC
HEMATOCRIT: 37.1 % — AB (ref 39.0–52.0)
HEMOGLOBIN: 11.7 g/dL — AB (ref 13.0–17.0)
MCH: 27.9 pg (ref 26.0–34.0)
MCHC: 31.5 g/dL (ref 30.0–36.0)
MCV: 88.3 fL (ref 78.0–100.0)
Platelets: 163 10*3/uL (ref 150–400)
RBC: 4.2 MIL/uL — AB (ref 4.22–5.81)
RDW: 15.2 % (ref 11.5–15.5)
WBC: 9.3 10*3/uL (ref 4.0–10.5)

## 2016-01-09 LAB — LIPID PANEL
CHOL/HDL RATIO: 6.9 ratio
CHOLESTEROL: 144 mg/dL (ref 0–200)
HDL: 21 mg/dL — AB (ref >40)
LDL Cholesterol: 92 mg/dL (ref 0–99)
TRIGLYCERIDES: 155 mg/dL — AB (ref <150)
VLDL: 31 mg/dL (ref 0–40)

## 2016-01-09 LAB — MAGNESIUM: Magnesium: 1.6 mg/dL — ABNORMAL LOW (ref 1.7–2.4)

## 2016-01-09 LAB — PATHOLOGIST SMEAR REVIEW

## 2016-01-09 LAB — MRSA PCR SCREENING: MRSA BY PCR: NEGATIVE

## 2016-01-09 MED ORDER — POTASSIUM CHLORIDE 10 MEQ/100ML IV SOLN
10.0000 meq | INTRAVENOUS | Status: AC
  Administered 2016-01-09 (×2): 10 meq via INTRAVENOUS
  Filled 2016-01-09 (×2): qty 100

## 2016-01-09 MED ORDER — POTASSIUM CHLORIDE CRYS ER 20 MEQ PO TBCR: 40.0000 meq | EXTENDED_RELEASE_TABLET | Freq: Once | ORAL | Status: DC

## 2016-01-09 MED ORDER — VANCOMYCIN HCL IN DEXTROSE 1-5 GM/200ML-% IV SOLN
1000.0000 mg | Freq: Two times a day (BID) | INTRAVENOUS | Status: DC
  Administered 2016-01-09 – 2016-01-10 (×2): 1000 mg via INTRAVENOUS
  Filled 2016-01-09 (×5): qty 200

## 2016-01-09 NOTE — Progress Notes (Addendum)
STROKE TEAM PROGRESS NOTE   HISTORY OF PRESENT ILLNESS (per record) Dominic Pineda is a 65 y.o. male with unclear time of onset who is not doing well for several days prior to being transferred here from his nursing home. On arrival, he was found to have hyponatremia, elevated creatinine and suggestion of TIA. It is felt by the internal medicine team, however, that his altered mental status seemed out of proportion to his medical problems and therefore an MRI was ordered which was performed today and shows ischemic infarct in the left parietal region. His LKW is unclear.  Patient was not administered IV t-PA secondary to unknown LKW. He was admitted for further evaluation and treatment.   SUBJECTIVE (INTERVAL HISTORY) Patient had a recent stroke in April 2017 but apparently done well and was recovering in assisted living facility at [facility]. He presented to mental status and was found to have UTI, dehydration and worsening right hemiparesis. MRI confirms a left parietal infarct.   OBJECTIVE Temp:  [97.5 F (36.4 C)-98.4 F (36.9 C)] 97.6 F (36.4 C) (06/12 0455) Pulse Rate:  [78-90] 78 (06/12 0455) Cardiac Rhythm:  [-] Normal sinus rhythm (06/11 1900) Resp:  [16-20] 18 (06/12 0455) BP: (134-182)/(64-101) 175/77 mmHg (06/12 0455) SpO2:  [84 %-100 %] 97 % (06/12 0455)  CBC:  Recent Labs Lab 01/06/16 1253 01/08/16 0727  WBC 18.3* 10.4  NEUTROABS 8.8*  --   HGB 15.4 13.1  HCT 47.6 39.4  MCV 90.7 89.3  PLT 260 173    Basic Metabolic Panel:  Recent Labs Lab 01/07/16 1700 01/08/16 0727  NA 150* 149*  K 3.7 3.9  CL 120* 118*  CO2 21* 23  GLUCOSE 96 97  BUN 37* 31*  CREATININE 1.22 1.17  CALCIUM 8.5* 8.4*    Lipid Panel:    Component Value Date/Time   CHOL 209* 11/12/2015 0541   TRIG 93 11/12/2015 0541   HDL 35* 11/12/2015 0541   CHOLHDL 6.0 11/12/2015 0541   VLDL 19 11/12/2015 0541   LDLCALC 155* 11/12/2015 0541   HgbA1c:  Lab Results  Component Value  Date   HGBA1C 5.5 11/12/2015   Urine Drug Screen:    Component Value Date/Time   LABOPIA NONE DETECTED 11/11/2015 1825   COCAINSCRNUR NONE DETECTED 11/11/2015 1825   LABBENZ NONE DETECTED 11/11/2015 1825   AMPHETMU NONE DETECTED 11/11/2015 1825   THCU NONE DETECTED 11/11/2015 1825   LABBARB NONE DETECTED 11/11/2015 1825      IMAGING  Ct Head Wo Contrast  01/07/2016  CLINICAL DATA:  Altered mental status, acute encephalopathy l EXAM: CT HEAD WITHOUT CONTRAST TECHNIQUE: Contiguous axial images were obtained from the base of the skull through the vertex without intravenous contrast. COMPARISON:  11/24/2015 FINDINGS: Diffuse atrophy with stable ventricular dilatation. MCA infarct/ encephalomalacia on the right. No hemorrhage or extra-axial fluid. New low-attenuation in the subcortical white matter of the left posterior parietal lobe. Calvarium intact. IMPRESSION: Chronic findings. Additionally,low-attenuation in the subcortical white matter posterior left parietal lobe. Findings are nonspecific but suggest edema related to cerebritis. Infarction or mass considered less likely. Further evaluation with MRI suggested. Electronically Signed   By: Esperanza Heir M.D.   On: 01/07/2016 21:03   Mr Laqueta Jean VW Contrast  01/08/2016  CLINICAL DATA:  Abnormal CT. Altered mental status. Acute encephalopathy. EXAM: MRI HEAD WITHOUT AND WITH CONTRAST TECHNIQUE: Multiplanar, multiecho pulse sequences of the brain and surrounding structures were obtained without and with intravenous contrast. CONTRAST:  20mL MULTIHANCE  GADOBENATE DIMEGLUMINE 529 MG/ML IV SOLN COMPARISON:  Head CT 01/07/2016, 11/24/2015.  MRI 11/11/2015 FINDINGS: Diffusion imaging shows acute to subacute cortical and subcortical infarction in the left parietal lobe consistent with left MCA branch vessel infarction. The area of involvement shows mild swelling. There is gyriform enhancement consistent with the more subacute timing. There is mild  swelling in the region. I do not see any blood products. No mass effect or shift. There is old infarction in the right MCA territory affecting the temporal lobe, insula and frontoparietal region which has progressed to atrophy, encephalomalacia and gliosis. No acute finding in that region. There is a small old cortical infarction in the left occipital lobe. There is an old small vessel cerebellar infarction on the left. No hydrocephalus. No extra-axial collection. No evidence of neoplastic mass lesion. No pituitary mass. Sinuses are clear. Flow is present in the major vessels the base of the brain. IMPRESSION: The abnormality in the left parietal lobe at CT is most consistent with a subacute left MCA branch vessel territory infarction with mild swelling and gyriform enhancement. Old cortical and subcortical infarction in the right MCA territory and in the left occipital lobe. Electronically Signed   By: Paulina Fusi M.D.   On: 01/08/2016 14:15       PHYSICAL EXAM Frail african [place] male not in distress. . Afebrile. Head is nontraumatic. Neck is supple without bruit.    Cardiac exam no murmur or gallop. Lungs are clear to auscultation. Distal pulses are well felt. Neurological Exam :  Awake alert but aphasic and not following commands. Slight left gaze preference but can look to the right past midline. Pupils equal reactive. Blinks to threat on the left more than right. Fundi were not visualized. He makes a few guttural sounds but does not speak words or sentences that can be understood. Does not follow even midline commands and has global aphasia. Face is symmetric. Tongue is midline. Able to move all 4 extremities against gravity but mild right hemiparesis 4/5 strength. Deep tendon reflexes are 2+ symmetric. Plantars are downgoing. Sensation and coordination cannot be reliably tested. Gait was not tested. ASSESSMENT/PLAN Dominic Pineda is a 65 y.o. male with history of Bipolar, hypertension,  BPH, depression and anxiety presenting with poor po intake and altered mental status. He did not receive IV t-PA due to unknown time LKW.   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 No source of embolus   LDL 155  HgbA1c 5.5  Lovenox 40 mg sq daily for VTE prophylaxis  Diet NPO time specified  aspirin 81 mg daily and clopidogrel 75 mg daily prior to admission, now on aspirin 300 mg suppository daily and clopidogrel 75 mg daily  Ongoing aggressive stroke risk factor management  Therapy recommendations:  SNF  Disposition:  pending  (from ALF)  Hypertension  Stable  Permissive hypertension (OK if < 220/120) but gradually normalize in 5-7 days  Long-term BP goal normotensive  Hyperlipidemia  Home meds:  lipitor 40, resumed in hospital  LDL 155, goal < 70  Continue statin at discharge  Other Stroke Risk Factors  Advanced age  FORMER Cigarette smoker  ETOH use, advised to drink no more than 2 drinks a day  Hx cocaine use, UDS neg this admission  obesity, Body mass index is 35.33 kg/(m^2).   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  I have made any additions or clarifications directly to the above note. Agree with note above. He presented with altered mental status and worsening of neurological deficits MRI shows left pareital infarct likely secondary to worsening of intracranial atherosclerosis/vasculopathy due to drug abuse in the setting of UTI, dehydration and hypotension. Greater then 50% time during this 25 minute visit was spent on coordination of care about his stroke  Delia HeadyPramod Ashvik Grundman, MD Medical Director [facility] Pager: [phone] 01/09/2016 6:07 PM     To contact Stroke Continuity provider, please refer to [email or web address]. After hours, contact General Neurology

## 2016-01-09 NOTE — Progress Notes (Signed)
Initial Nutrition Assessment  DOCUMENTATION CODES:   Obesity unspecified  INTERVENTION:   -RD will follow for diet advancement and supplement as appropriate -If pt/family desire aggressive measure and unable to take PO safely, recommend:  Initiate Jevity 1.5 @ 20 ml/hr and increase by 10 ml every 4 hours to goal rate of 55 ml/hr.   30 ml Prostat BID.    Tube feeding regimen provides 2180 kcal (100% of needs), 114 grams of protein, and 1003 ml of H2O.   NUTRITION DIAGNOSIS:   Inadequate oral intake related to inability to eat as evidenced by NPO status.  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 Over the last week, patient has had decreased po intake with associated altered mentation. On presentation to the hospital, patient's sodium was found to be 154 (estimated water deficit of 7.5L using assuming weight of 125 KG), BUN of 53, Scr of 1.91 (up from 1.37). UA is suggestive of UTI. No history available from patient. Patient was seen alongside patient's daughter (from [place]).  Pt admitted with FTT and ARF. [facility]. [facility] due to concern for CVA.   Pt in with ultrasound tech at time of visit. Pt did not arouse when name was called or during exam. No family present.   Reviewed SLP note from 01/09/16; recommend NPO due to severe aspiration risk. Previous diet order was dysphagia 1 (puree) diet with thin liquids. Pt with high aspiration risk, may need discussion with MD regarding alternative methods of nutrition.   Nutrition-Focused physical exam completed. Findings are no fat depletion, no muscle  depletion, and mild edema.   Reviewed wt hx; noted UBW 230#. Per documented wt hx, pt has experienced a 34# (12%) wt loss over the past 2 months.   Case discussed with nurse tech. Verified current wt of 246.2#.   Labs reviewed: K: 3.4 (on PO supplementation).   Diet Order:  Diet NPO time specified  Skin:  Wound (see comment) (stage I sacrum)  Last BM:  PTA  Height:   Ht Readings from Last 1 Encounters:  01/09/16 5\' 10"  (1.778 m)    Weight:   Wt Readings from Last 1 Encounters:  01/09/16 246 lb 3.2 oz (111.676 kg)    Ideal Body Weight:  74.5 kg  BMI:  Body mass index is 35.33 kg/(m^2).  Estimated Nutritional Needs:   Kcal:  2000-2200  Protein:  100-115 grams  Fluid:  2.2-2.4 L  EDUCATION NEEDS:   No education needs identified at this time  Raliyah Montella A. Mayford KnifeWilliams, RD, LDN, CDE Pager: [phone] After hours Pager: [phone]

## 2016-01-09 NOTE — Progress Notes (Signed)
Confirmed pt is not scheduled for TEE today, pt will have swallow evaluation by speech therapy today

## 2016-01-09 NOTE — Progress Notes (Signed)
Pharmacy Antibiotic Note  Dominic Pineda is a 65 y.o. male admitted on 01/06/2016 with UTI.  He was admitted 6/9 from NH with AMS and FTT over previous week.  Rocephin was started for UTI.  He appears to be improving (WBC 18.3-->10.4, Afebrile), however urine cx now +SA.  Pharmacy has been consulted for Vancomycin dosing pending sensitivities.   Daughter reports no use of urinary catheters, etc PTA, currently has condom catheter.  As SA not typical urinary pathogen, blood cx has been sent. The patient has been [facility] to consult cardiology for a TEE to evaluate for IE with septic emboli.  The patient continues on Vancomycin D#2 with improving renal function. SCr 1.17, CrCl~60-65 ml/min. Will adjust Vancomycin dose today.   Goal of Therapy: Vancomycin trough of 15-20 mcg/ml (until IE has been ruled out) Proper antibiotics for infection/cultures adjusted for renal/hepatic function   Plan: 1. Adjust Vancomycin to 1g IV every 12 hours 2. Will continue to follow renal function, culture results, LOT, and antibiotic de-escalation plans      Unable to weigh patient- last recorded wt from April 2017 ~ 125kg.  RN and daughter estimate this is still accurate.    Temp (24hrs), Avg:98 F (36.7 C), Min:97.5 F (36.4 C), Max:98.4 F (36.9 C)   Recent Labs Lab 01/06/16 1253 01/07/16 1700 01/08/16 0727  WBC 18.3*  --  10.4  CREATININE 1.91* 1.22 1.17    CrCl cannot be calculated (Unknown ideal weight.).    Allergies  Allergen Reactions  . Lactose Intolerance (Gi) Diarrhea    Stomach pain  . Lisinopril Other (See Comments)    angioedema  . Codeine Nausea Only    Antimicrobials this admission: Rocephin 6/9 >> 6/11 Vanc 6/11 >>   Dose adjustments this admission:  Microbiology results: 6/11 BCx: sent 6/9 UCx: >100K SA   Thank you for allowing pharmacy to be a part of this patient's care.  Dominic Pineda, PharmD, BCPS Clinical Pharmacist Pager: [phone] 01/09/2016 8:47  AM

## 2016-01-09 NOTE — Progress Notes (Signed)
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  pt also with FTT, Dehydration, and UTI.  pt with hx of CVA, HTN, Bipolar, Mental Disorder, Depression, Anxiety, and hx cocaine abuse.    PT Comments    Pt currently requiring 2 person A simply to get EOB.  Feel return to ALF may not be safest D/C option for pt based on current needs.  Feel SNF level of care most appropriate.  Pt not currently following directions and only with limited participation in mobility.  Will continue to follow while on acute.    Follow Up Recommendations  SNF     Equipment Recommendations  None recommended by PT    Recommendations for Other Services       Precautions / Restrictions Precautions Precautions: Fall Restrictions Weight Bearing Restrictions: No    Mobility  Bed Mobility Overal bed mobility: Needs Assistance;+2 for physical assistance Bed Mobility: Rolling;Supine to Sit;Sit to Supine Rolling: +2 for physical assistance;Max assist   Supine to sit: +2 for physical assistance;Mod assist Sit to supine: Max assist;+2 for physical assistance   General bed mobility comments: After verbal and gestural cures, pt assisting with coming from supine to sit using LUE to push up into sitting. A requried to complete transition of trunk to upright positioning. Initially strong posterior and lean and pushing with LUE.   Transfers                 General transfer comment: Attempted multiple times. Unable to complete at this time. Pt not following commands to stand.   Ambulation/Gait                 Stairs            Wheelchair Mobility    Modified Rankin (Stroke Patients Only) Modified Rankin (Stroke Patients Only) Pre-Morbid Rankin Score: Moderate disability  (Assumed per chart review.) Modified Rankin: Severe disability     Balance Overall balance assessment: Needs assistance Sitting-balance support: No upper extremity supported;Single extremity supported;Feet supported Sitting balance-Leahy Scale: Fair Sitting balance - Comments: Initially requiring Max A to maintain balance, but feel this was due to pushing with L UE and some comprehension.  Once sitting EOB pt able to maintain his own balance without A.                              Cognition Arousal/Alertness: Awake/alert Behavior During Therapy: Flat affect;Restless Overall Cognitive Status: No family/caregiver present to determine baseline cognitive functioning                      Exercises      General Comments        Pertinent Vitals/Pain Pain Assessment: Faces Faces Pain Scale: No hurt    Home Living Family/patient expects to be discharged to:: Assisted living (spoke with daughter, Dominic Pineda.  )             Home Equipment: Dan HumphreysWalker - 2 wheels Additional Comments: starting PT and OT     Prior Function Level of Independence: Needs assistance      Comments: unsure of PLOF. no family available   PT Goals (current goals can now be found in the care plan section) Acute Rehab PT Goals Patient Stated  Goal: Per eval pt's daughter wants her dad to return to [facility]. PT Goal Formulation: With patient/family Time For Goal Achievement: 01/21/16 Potential to Achieve Goals: Good Progress towards PT goals: Progressing toward goals    Frequency  Min 2X/week    PT Plan Discharge plan needs to be updated    Co-evaluation PT/OT/SLP Co-Evaluation/Treatment: Yes Reason for Co-Treatment: Complexity of the patient's impairments (multi-system involvement);For patient/therapist safety PT goals addressed during session: Mobility/safety with mobility;Balance       End of Session Equipment Utilized During Treatment: Gait belt Activity Tolerance:  Patient tolerated treatment well Patient left: in bed;with call bell/phone within reach;with bed alarm set     Time: 0912-0935 PT Time Calculation (min) (ACUTE ONLY): 23 min  Charges:  $Therapeutic Activity: 8-22 mins                    G CodesSunny Schlein, [place] [phone] 01/09/2016, 10:36 AM

## 2016-01-09 NOTE — Clinical Social Work Note (Signed)
Clinical Social Work Assessment  Patient Details  Name: Dominic Pineda MRN: [id number] Date of Birth: [birth year]  Date of referral:  01/09/16               Reason for consult:  Facility Placement                Permission sought to share information with:  Family Supports Permission granted to share information::  Yes, Verbal Permission Granted  Name::     Doctor, hospitaladiah  [facility]  Relationship::  dtr  Contact Information:     Housing/Transportation Living arrangements for the past 2 months:  [facility] Source of Information:  Adult Children Patient Interpreter Needed:  None Criminal Activity/Legal Involvement Pertinent to Current Situation/Hospitalization:  No - Comment as needed Significant Relationships:  Adult Children Lives with:  Facility Resident Do you feel safe going back to the place where you live?  Yes Need for family participation in patient care:  Yes (Comment) (decision making)  Care giving concerns:  None- pt dtr is very satisfied with pt care at ALF   Social Worker assessment / plan:  CSW spoke with pt dtr concerning plan at time of DC.  CSW explained that current recommendation is for transfer to SNF when pt is stable.  Dtr would prefer return to ALF is safe but state [facility] and would like pt to return there if higher level of care is needed at time of DC.  Employment status:  Retired Database administratornsurance information:  Managed Medicare PT Recommendations:  Skilled Nursing Facility Information / Referral to community resources:  Skilled Nursing Facility  Patient/Family's Response to care:  [facility] with PT/OT following.  Patient/Family's Understanding of and Emotional Response to Diagnosis, Current Treatment, and Prognosis:  Dtr expresses good understanding of pt condition and hopeful his mental status will clear so he can return to  ALF  Emotional Assessment Appearance:  Appears stated age Attitude/Demeanor/Rapport:  Unable to Assess Affect (typically observed):  Unable to Assess Orientation:  Oriented to Self Alcohol / Substance use:  Not Applicable Psych involvement (Current and /or in the community):  No (Comment)  Discharge Needs  Concerns to be addressed:  Mental Health Concerns, Care Coordination Readmission within the last 30 days:  No Current discharge risk:  Physical Impairment Barriers to Discharge:  Continued Medical Work up   Izora RibasHoloman, Arla Boutwell M, LCSW 01/09/2016, 2:14 PM

## 2016-01-09 NOTE — Progress Notes (Signed)
SLP Cancellation Note  Patient Details Name: Dominic Pineda MRN: [id number] DOB: Feb 18, 1951   Cancelled treatment:       Reason Eval/Treat Not Completed: Medical issues which prohibited therapy. SLP bedside swallow [facility], but mentation not appropriate for PO. Currently pt for possible TEE, so will defer further swallow assessment until that is complete. RN also reports mentation still poor.    Darbi Chandran, Riley NearingBonnie Caroline 01/09/2016, 9:08 AM

## 2016-01-09 NOTE — Care Management Important Message (Signed)
Important Message  Patient Details  Name: Dominic HoughRonald E Mcfarlane MRN: [id number] Date of Birth: 1950/08/28   Medicare Important Message Given:  Yes    [name], [name] 01/09/2016, 11:15 AM

## 2016-01-09 NOTE — NC FL2 (Signed)
[place] MEDICAID FL2 LEVEL OF CARE SCREENING TOOL     IDENTIFICATION  Patient Name: Dominic HoughRonald E Haddix Birthdate: [birth year] Sex: male Admission Date (Current Location): 01/06/2016  [facility] [place]   Facility and Address:  [facility] [place]. [facility], [address], [place], [place] [address]      Provider Number: [id number]  Attending Physician Name and Address:  Kathlen ModyVijaya Akula, MD  Relative Name and Phone Number:       Current Level of Care: Hospital Recommended Level of Care: Skilled Nursing Facility Prior Approval Number:    Date Approved/Denied:   PASRR Number:    Discharge Plan: SNF    Current Diagnoses: Patient Active Problem List   Diagnosis Date Noted  . Pressure ulcer 01/09/2016  . CVA (cerebral infarction) 01/08/2016  . Dehydration 01/07/2016  . FTT (failure to thrive) in adult 01/07/2016  . Acute renal failure (HCC) 01/06/2016  . Failure to thrive (0-17) 01/06/2016  . 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 Cognitive deficit, post-stroke 12/09/2015  . Major neurocognitive disorder, due to vascular disease, without behavioral disturbance, mild   . CVA (cerebral vascular accident) (HCC) 11/15/2015  . Essential hypertension   . Hemiparesis affecting left side as late effect of stroke (HCC)   . Bipolar affective disorder in remission (HCC)   . Benign essential HTN   . History of CVA (cerebrovascular accident)   . Acute blood loss anemia   . AKI (acute kidney injury) (HCC)   . Acute cerebrovascular accident (CVA) (HCC) 11/12/2015  . Substance abuse   . Severe hypertension 11/11/2015  . Stroke (HCC)   . Urinary tract infectious disease   . Facial droop 10/30/2011  . CKD (chronic kidney disease) 10/30/2011  . UTI (lower urinary tract infection) 10/30/2011  . Obstructive uropathy 09/28/2011  . ARF (acute renal failure) (HCC) 09/23/2011  . Diarrhea 09/23/2011  . Nausea & vomiting 09/23/2011  . Hyperkalemia 09/23/2011  .  Polyuria 09/23/2011  . Hypertensive emergency 09/23/2011  . Bipolar 1 disorder (HCC) 09/23/2011  . Shuffling gait 09/23/2011  . Weakness generalized 09/23/2011  . H/O cocaine abuse 09/23/2011  . Hypertension   . Incontinence     Orientation RESPIRATION BLADDER Height & Weight     Self  Normal Continent Weight: 111.676 kg (246 lb 3.2 oz) Height:  5\' 10"  (177.8 cm) (11/15/15 encounter)  BEHAVIORAL SYMPTOMS/MOOD NEUROLOGICAL BOWEL NUTRITION STATUS      Continent Diet  AMBULATORY STATUS COMMUNICATION OF NEEDS Skin   Extensive Assist Verbally PU Stage and Appropriate Care PU Stage 1 Dressing:  (PRN dressing changes)                     Personal Care Assistance Level of Assistance  Feeding Bathing Assistance: Maximum assistance Feeding assistance: Maximum assistance Dressing Assistance: Maximum assistance     Functional Limitations Info             SPECIAL CARE FACTORS FREQUENCY  PT (By licensed PT), OT (By licensed OT)     PT Frequency: 5/wk OT Frequency: 5/wk            Contractures      Additional Factors Info                  Current Medications (01/09/2016):  This is the current hospital active medication list Current Facility-Administered Medications  Medication Dose Route Frequency Provider Last Rate Last Dose  .  stroke: mapping our early stages of recovery book  Does not apply Once Jeralyn Bennett, MD      . 0.45 % sodium chloride infusion   Intravenous Continuous Jeralyn Bennett, MD 100 mL/hr at 01/09/16 0211    . amLODipine (NORVASC) tablet 5 mg  5 mg Oral Daily Jeralyn Bennett, MD   5 mg at 01/07/16 1400  . aspirin EC tablet 325 mg  325 mg Oral Daily Rejeana Brock, MD   325 mg at 01/09/16 1610   Or  . aspirin suppository 300 mg  300 mg Rectal Daily Rejeana Brock, MD   300 mg at 01/09/16 1016  . atorvastatin (LIPITOR) tablet 40 mg  40 mg Oral Daily Barnetta Chapel, MD   40 mg at 01/06/16 2038  . clopidogrel (PLAVIX) tablet  75 mg  75 mg Oral Daily Barnetta Chapel, MD   75 mg at 01/07/16 1000  . enoxaparin (LOVENOX) injection 40 mg  40 mg Subcutaneous Q24H Jeralyn Bennett, MD   40 mg at 01/08/16 2128  . mineral oil-hydrophilic petrolatum (AQUAPHOR) ointment   Topical Daily Barnetta Chapel, MD      . potassium chloride 10 mEq in 100 mL IVPB  10 mEq Intravenous Q1 Hr x 2 Kathlen Mody, MD   10 mEq at 01/09/16 1539  . potassium chloride SA (K-DUR,KLOR-CON) CR tablet 40 mEq  40 mEq Oral Once Kathlen Mody, MD   40 mEq at 01/09/16 1304  . senna-docusate (Senokot-S) tablet 1 tablet  1 tablet Oral QHS PRN Jeralyn Bennett, MD      . vancomycin (VANCOCIN) IVPB 1000 mg/200 mL premix  1,000 mg Intravenous Q12H Ann Held, [facility]         Discharge Medications: Please see discharge summary for a list of discharge medications.  Relevant Imaging Results:  Relevant Lab Results:   Additional Information SS#: [id number]  Izora Ribas, [place]

## 2016-01-09 NOTE — Progress Notes (Signed)
Occupational Therapy Evaluation Patient Details Name: Dominic Pineda MRN: [id number] DOB: [birth year] Today's Date: 01/09/2016    History of Present Illness 65 yo male admitted from assisted living with  failure to thrive, dehydration and possible UTI  Past history includes a CVA in April of this year  Spoke with daughter, Hal Neer, who states that pt had been in rehab at [facility] until May 31 when he was discharged to [facility]. He was able to walk at times without a device and sometimes with a walker,  He has just started PT and OT.  Over this past week he has stopped eating and drinking. She hopes that pt will be discharged back to [facility]. Old infarction in the right MCA territory affecting thetemporal lobe, insula and frontoparietal region. CT + subacute L parietal lobe infarct.    Clinical Impression   PTA, pt living in ALF. At this time, pt required Max A +2 for bed mobility. Unable to assess functional transfer or mobility due to pt not following gestural or verbal cues and resistive at times when attempting hand over hand. Max A for ADL. Feel pt is appropriate for SNF level care unless pt is able to begin mobilizing. Nonverbal during session. Will follow acutely to address above deficits.     Follow Up Recommendations  SNF;Supervision/Assistance - 24 hour    Equipment Recommendations  None recommended by OT    Recommendations for Other Services       Precautions / Restrictions Precautions Precautions: Fall Restrictions Weight Bearing Restrictions: No      Mobility Bed Mobility Overal bed mobility: +2 for physical assistance;Needs Assistance Bed Mobility: Rolling;Supine to Sit;Sit to Supine Rolling: +2 for physical assistance;Max assist   Supine to sit: +2 for physical assistance;Mod assist Sit to supine: Max assist;+2 for physical assistance   General bed mobility comments: After verbal and gestural cures, pt assisting with coming from supine to sit  using LUE to push up into sitting. A requried to complete transition of trunk to upright positioning. Initially strong posterior and lean and pushing with LUE.   Transfers                 General transfer comment: Attempted multiple times. Unable to complete at this time. Pt not following commands to stand.     Balance Overall balance assessment: Needs assistance   Sitting balance-Leahy Scale: Fair                                      ADL Overall ADL's : Needs assistance/impaired Eating/Feeding: NPO   Grooming: Maximal assistance Grooming Details (indicate cue type and reason): Able to wipe mouth on command x 2/6 trails. Able to complete movement pattern. Inconsistently following commands Upper Body Bathing: Maximal assistance   Lower Body Bathing: Maximal assistance   Upper Body Dressing : Maximal assistance   Lower Body Dressing: Maximal assistance               Functional mobility during ADLs:  (unable to assess) General ADL Comments: Initially attempted to have pt wipe mouth at bed level. Pt would not follow commands. When mouth wiped by therapist, pt pushing therapist's hand away. Pt assisting with getting to EOB. Attempted to stand but unable to get pt to follow commands using verbal cues and getural cues.      Vision Additional Comments: Pt maintaining eye contact and appears to  have a conjugate gaze. Unsute of visual hx   Perception     Praxis Praxis Praxis tested?: Deficits Deficits: Initiation;Organization    Pertinent Vitals/Pain Pain Assessment: Faces Faces Pain Scale: No hurt     Hand Dominance Right   Extremity/Trunk Assessment Upper Extremity Assessment Upper Extremity Assessment: RUE deficits/detail RUE Deficits / Details: Attempts to use RUE. did not observe functional grasp. Could not hold washcloth. greater movement proximally against gravity. unsure of baseline as pt with hx of CVA RUE Coordination: decreased fine  motor;decreased gross motor   Lower Extremity Assessment Lower Extremity Assessment: Defer to PT evaluation (able to "kick with both legs")   Cervical / Trunk Assessment Cervical / Trunk Assessment: Other exceptions Cervical / Trunk Exceptions: initially pushing posteriorly and pushing greater with LUE. After sitting EOB for @ 2 min, pt able to maintain midline postrual control with S   Communication Communication Communication: Other (comment);Receptive difficulties;Expressive difficulties   Cognition Arousal/Alertness: Awake/alert Behavior During Therapy: Flat affect;Restless Overall Cognitive Status: No family/caregiver present to determine baseline cognitive functioning (dtr says uncooperative at ALF, but did OK at [facility] (per PT eval)                     General Comments       Exercises       Shoulder Instructions      Home Living Family/patient expects to be discharged to:: Assisted living (spoke with daughter, Radiah.  )                             Home Equipment: Dan Humphreys - 2 wheels   Additional Comments: starting PT and OT       Prior Functioning/Environment Level of Independence: Needs assistance        Comments: unsure of PLOF. no family available    OT Diagnosis: Generalized weakness;Cognitive deficits;Hemiplegia dominant side;Altered mental status;Apraxia   OT Problem List: Decreased strength;Decreased range of motion;Decreased activity tolerance;Impaired balance (sitting and/or standing);Impaired vision/perception;Decreased coordination;Decreased cognition;Decreased safety awareness;Decreased knowledge of use of DME or AE;Impaired tone;Obesity;Impaired UE functional use   OT Treatment/Interventions: Self-care/ADL training;Therapeutic exercise;Neuromuscular education;DME and/or AE instruction;Therapeutic activities;Cognitive remediation/compensation;Visual/perceptual remediation/compensation;Patient/family education;Balance training    OT  Goals(Current goals can be found in the care plan section) Acute Rehab OT Goals Patient Stated Goal: for her dad to return to [facility]  - per PT note (for her dad to return to [facility] ) OT Goal Formulation: Patient unable to participate in goal setting  OT Frequency: Min 2X/week   Barriers to D/C:            Co-evaluation              End of Session Equipment Utilized During Treatment: Gait belt Nurse Communication: Mobility status  Activity Tolerance: Patient tolerated treatment well Patient left: in bed;with call bell/phone within reach;with bed alarm set   Time: 8657-8469 OT Time Calculation (min): 23 min Charges:  OT General Charges $OT Visit: 1 Procedure OT Evaluation $OT Eval Moderate Complexity: 1 Procedure G-Codes:    Chauna Osoria,HILLARY 02-02-2016, 9:55 AM   Luisa Dago, OTR/L  [phone] February 02, 2016

## 2016-01-09 NOTE — Progress Notes (Signed)
PROGRESS NOTE    GAUGE SKLUZACEK  ZOX:[id number] DOB: 09-13-1950 DOA: 01/06/2016 PCP: PROVIDER NOT IN SYSTEM    Brief Narrative:  Mr. Rippeon is a 65 year old gentleman who suffered a cerebrovascular accident in April 2017, had presented with slurred speech and facial droop, imaging revealing a left corona radiata infarct, and was discharged to inpatient rehabilitation. He was discharged on 11/28/2015 from inpatient rehabilitation to skilled nursing facility. Recently he had been residing at [facility], transferred to the emergency department on 01/06/2016 having decreased by mouth intake and a functional decline. There was concern for dehydration. Initial lab work revealed an elevated creatinine of 1.91, white count of 18,300, urinalysis positive for bacteria, leukocytes and nitrates. He was given IV fluids and started on IV antibiotic therapy.   Assessment & Plan:   Active Problems:   Hypertension   CKD (chronic kidney disease)   UTI (lower urinary tract infection)   History of CVA (cerebrovascular accident)   Dysarthria, post-stroke   Acute renal failure (HCC)   Failure to thrive (0-17)   Dehydration   FTT (failure to thrive) in adult   CVA (cerebral infarction)   Pressure ulcer   1.  Failure to thrive/Acute encephalopthy -Mr Klema having a history of CVA in April 2017 -He was sent over from his facility for decreased by mouth intake and functional decline -On exam he appear dehydrated with lab work showing development of acute on chronic renal failure. Urinalysis revealed the presence of urinary tract infection. -It is possible that an infectious process like UTI may have precipitated functional decline.  -Having history of recent stroke, Repeat MRI was done showed most consistent with a subacute left MCA branch vessel territory infarction with mild swelling and gyriform enhancement. - Neurology consulted and will follow the patient.    2.  History of CVA. -He was  hospitalized in April 2017 for left corona radiata infarct -He had dysphasia post CVA. - currently NPO , suspect new stroke, plan for re eval with SLP today.  -MRI shows a sub acute infarct in the left parietal lobe. Neurology consulted, .   3.  Urinary tract infection. -Urinalysis showing many bacteria, leukocyte esterase, nitrates. -He had been on empiric IV antibiotic therapy with ceftriaxone -Urine cultures growing Staph aureus, changed antibiotic coverage to Vancomycin until susceptibility testing available.   4.  Acute on chronic renal failure -Lab work showing an upward trend in his creatinine 1.9 with BUN of 53. Prior to this he had a and 1.37 and BUN of 27 on 01/01/2016 -On physical examination he appears dehydrated. It appears he has had a functional decline with minimal by mouth intake. -Repeat labs showing Cr of 1.17 with BUN of 31 -He remains NPO will continue IV fluids - slp eval today and plan to encourage oral intake.  His renal function normal on 6/12.   5.  Hypernatremia. -Lab work showing sodium of 154, likely secondary to dehydration -Receiving IV fluids with 1/2 NS - RESOLVED.   6.  Hypertension. -Continue amlodipine 5 mg by mouth daily  7. Hypokalemia: replete as needed.   DVT prophylaxis: Subcutaneous heparin Code Status: Full code Family Communication: I spoke to his daughter was present at bedside Disposition Plan: Pending PT EVAL.   Antimicrobials:   Vancomycin. For staph in UA.   Subjective: Mr Mohiuddin is nonverbal, uncooperative  Objective: Filed Vitals:   01/08/16 2027 01/08/16 2241 01/09/16 0124 01/09/16 0455  BP: 153/77 134/101 154/84 175/77  Pulse: 84 82 81 78  Temp:  98.3 F (36.8 C) 98 F (36.7 C) 97.6 F (36.4 C)  TempSrc:      Resp: 18 16 18 18   SpO2: 100% 100% 98% 97%    Intake/Output Summary (Last 24 hours) at 01/09/16 1038 Last data filed at 01/09/16 7846  Gross per 24 hour  Intake   1200 ml  Output    450 ml  Net     750 ml   There were no vitals filed for this visit.  Examination:  General exam: Nonverbal, Respiratory system: Clear to auscultation. Respiratory effort normal. Cardiovascular system: S1 & S2 heard, RRR. No JVD, murmurs, rubs, gallops or clicks. No pedal edema. Gastrointestinal system: Abdomen is nondistended, soft and nontender. No organomegaly or masses felt. Normal bowel sounds heard. Central nervous system: able to move all extremities, but he is non verbal. Complete neuro exam couldn't be performed.  Extremities: Symmetric 5 x 5 power. Skin: No rashes, lesions or ulcers     Data Reviewed: I have personally reviewed following labs and imaging studies  CBC:  Recent Labs Lab 01/06/16 1253 01/08/16 0727 01/09/16 0746  WBC 18.3* 10.4 9.3  NEUTROABS 8.8*  --   --   HGB 15.4 13.1 11.7*  HCT 47.6 39.4 37.1*  MCV 90.7 89.3 88.3  PLT 260 173 163   Basic Metabolic Panel:  Recent Labs Lab 01/06/16 1253 01/07/16 1700 01/08/16 0727 01/09/16 0746  NA 154* 150* 149* 145  K 4.0 3.7 3.9 3.4*  CL 120* 120* 118* 114*  CO2 22 21* 23 22  GLUCOSE 128* 96 97 83  BUN 53* 37* 31* 17  CREATININE 1.91* 1.22 1.17 1.04  CALCIUM 9.5 8.5* 8.4* 8.5*   GFR: CrCl cannot be calculated (Unknown ideal weight.). Liver Function Tests:  Recent Labs Lab 01/06/16 1253  AST 22  ALT 21  ALKPHOS 100  BILITOT 3.2*  PROT 8.2*  ALBUMIN 4.6   No results for input(s): LIPASE, AMYLASE in the last 168 hours. No results for input(s): AMMONIA in the last 168 hours. Coagulation Profile: No results for input(s): INR, PROTIME in the last 168 hours. Cardiac Enzymes: No results for input(s): CKTOTAL, CKMB, CKMBINDEX, TROPONINI in the last 168 hours. BNP (last 3 results) No results for input(s): PROBNP in the last 8760 hours. HbA1C: No results for input(s): HGBA1C in the last 72 hours. CBG:  Recent Labs Lab 01/06/16 1224  GLUCAP 116*   Lipid Profile:  Recent Labs  01/09/16 0746  CHOL  144  HDL 21*  LDLCALC 92  TRIG 962*  CHOLHDL 6.9   Thyroid Function Tests: No results for input(s): TSH, T4TOTAL, FREET4, T3FREE, THYROIDAB in the last 72 hours. Anemia Panel: No results for input(s): VITAMINB12, FOLATE, FERRITIN, TIBC, IRON, RETICCTPCT in the last 72 hours. Sepsis Labs: No results for input(s): PROCALCITON, LATICACIDVEN in the last 168 hours.  Recent Results (from the past 240 hour(s))  Urine culture     Status: Abnormal   Collection Time: 01/06/16 10:52 PM  Result Value Ref Range Status   Specimen Description URINE, RANDOM  Final   Special Requests NONE  Final   Culture >=100,000 COLONIES/mL STAPHYLOCOCCUS AUREUS (A)  Final   Report Status 01/09/2016 FINAL  Final   Organism ID, Bacteria STAPHYLOCOCCUS AUREUS (A)  Final      Susceptibility   Staphylococcus aureus - MIC*    CIPROFLOXACIN >=8 RESISTANT Resistant     GENTAMICIN <=0.5 SENSITIVE Sensitive     NITROFURANTOIN <=16 SENSITIVE Sensitive  OXACILLIN 0.5 SENSITIVE Sensitive     TETRACYCLINE <=1 SENSITIVE Sensitive     VANCOMYCIN <=0.5 SENSITIVE Sensitive     TRIMETH/SULFA <=10 SENSITIVE Sensitive     CLINDAMYCIN <=0.25 RESISTANT Resistant     RIFAMPIN <=0.5 SENSITIVE Sensitive     Inducible Clindamycin POSITIVE Resistant     * >=100,000 COLONIES/mL STAPHYLOCOCCUS AUREUS  MRSA PCR Screening     Status: None   Collection Time: 01/08/16 10:59 PM  Result Value Ref Range Status   MRSA by PCR NEGATIVE NEGATIVE Final    Comment:        The GeneXpert MRSA Assay (FDA approved for NASAL specimens only), is one component of a comprehensive MRSA colonization surveillance program. It is not intended to diagnose MRSA infection nor to guide or monitor treatment for MRSA infections.          Radiology Studies: Ct Head Wo Contrast  01/07/2016  CLINICAL DATA:  Altered mental status, acute encephalopathy l EXAM: CT HEAD WITHOUT CONTRAST TECHNIQUE: Contiguous axial images were obtained from the base of  the skull through the vertex without intravenous contrast. COMPARISON:  11/24/2015 FINDINGS: Diffuse atrophy with stable ventricular dilatation. MCA infarct/ encephalomalacia on the right. No hemorrhage or extra-axial fluid. New low-attenuation in the subcortical white matter of the left posterior parietal lobe. Calvarium intact. IMPRESSION: Chronic findings. Additionally,low-attenuation in the subcortical white matter posterior left parietal lobe. Findings are nonspecific but suggest edema related to cerebritis. Infarction or mass considered less likely. Further evaluation with MRI suggested. Electronically Signed   By: Esperanza Heir M.D.   On: 01/07/2016 21:03   Mr Laqueta Jean ZO Contrast  01/08/2016  CLINICAL DATA:  Abnormal CT. Altered mental status. Acute encephalopathy. EXAM: MRI HEAD WITHOUT AND WITH CONTRAST TECHNIQUE: Multiplanar, multiecho pulse sequences of the brain and surrounding structures were obtained without and with intravenous contrast. CONTRAST:  20mL MULTIHANCE GADOBENATE DIMEGLUMINE 529 MG/ML IV SOLN COMPARISON:  Head CT 01/07/2016, 11/24/2015.  MRI 11/11/2015 FINDINGS: Diffusion imaging shows acute to subacute cortical and subcortical infarction in the left parietal lobe consistent with left MCA branch vessel infarction. The area of involvement shows mild swelling. There is gyriform enhancement consistent with the more subacute timing. There is mild swelling in the region. I do not see any blood products. No mass effect or shift. There is old infarction in the right MCA territory affecting the temporal lobe, insula and frontoparietal region which has progressed to atrophy, encephalomalacia and gliosis. No acute finding in that region. There is a small old cortical infarction in the left occipital lobe. There is an old small vessel cerebellar infarction on the left. No hydrocephalus. No extra-axial collection. No evidence of neoplastic mass lesion. No pituitary mass. Sinuses are clear. Flow  is present in the major vessels the base of the brain. IMPRESSION: The abnormality in the left parietal lobe at CT is most consistent with a subacute left MCA branch vessel territory infarction with mild swelling and gyriform enhancement. Old cortical and subcortical infarction in the right MCA territory and in the left occipital lobe. Electronically Signed   By: Paulina Fusi M.D.   On: 01/08/2016 14:15        Scheduled Meds: .  stroke: mapping our early stages of recovery book   Does not apply Once  . amLODipine  5 mg Oral Daily  . aspirin EC  325 mg Oral Daily   Or  . aspirin  300 mg Rectal Daily  . atorvastatin  40 mg Oral Daily  .  clopidogrel  75 mg Oral Daily  . enoxaparin (LOVENOX) injection  40 mg Subcutaneous Q24H  . mineral oil-hydrophilic petrolatum   Topical Daily  . vancomycin  1,000 mg Intravenous Q12H   Continuous Infusions: . sodium chloride 100 mL/hr at 01/09/16 0211     LOS: 3 days    Time spent: 35 min    Lawrie Tunks, MD [facility] Pager [phone]  If 7PM-7AM, please contact night-coverage [email or web address] Password [facility] 01/09/2016, 10:38 AM

## 2016-01-09 NOTE — Progress Notes (Signed)
MBSS complete. Full report located under chart review in imaging section. Chandy Tarman, MA CCC-SLP [phone]  

## 2016-01-09 NOTE — Evaluation (Signed)
Speech Language Pathology Evaluation Patient Details Name: Dominic Pineda MRN: [id number] DOB: 1950-08-18 Today's Date: 01/09/2016 Time: 0347-4259 SLP Time Calculation (min) (ACUTE ONLY): 26 min  Problem List:  Patient Active Problem List   Diagnosis Date Noted  . Pressure ulcer 01/09/2016  . CVA (cerebral infarction) 01/08/2016  . Dehydration 01/07/2016  . FTT (failure to thrive) in adult 01/07/2016  . Acute renal failure (HCC) 01/06/2016  . Failure to thrive (0-17) 01/06/2016  . 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 Cognitive deficit, post-stroke 12/09/2015  . Major neurocognitive disorder, due to vascular disease, without behavioral disturbance, mild   . CVA (cerebral vascular accident) (HCC) 11/15/2015  . Essential hypertension   . Hemiparesis affecting left side as late effect of stroke (HCC)   . Bipolar affective disorder in remission (HCC)   . Benign essential HTN   . History of CVA (cerebrovascular accident)   . Acute blood loss anemia   . AKI (acute kidney injury) (HCC)   . Acute cerebrovascular accident (CVA) (HCC) 11/12/2015  . Substance abuse   . Severe hypertension 11/11/2015  . Stroke (HCC)   . Urinary tract infectious disease   . Facial droop 10/30/2011  . CKD (chronic kidney disease) 10/30/2011  . UTI (lower urinary tract infection) 10/30/2011  . Obstructive uropathy 09/28/2011  . ARF (acute renal failure) (HCC) 09/23/2011  . Diarrhea 09/23/2011  . Nausea & vomiting 09/23/2011  . Hyperkalemia 09/23/2011  . Polyuria 09/23/2011  . Hypertensive emergency 09/23/2011  . Bipolar 1 disorder (HCC) 09/23/2011  . Shuffling gait 09/23/2011  . Weakness generalized 09/23/2011  . H/O cocaine abuse 09/23/2011  . Hypertension   . Incontinence    Past Medical History:  Past Medical History  Diagnosis Date  . Hypertension   . Bipolar 1 disorder (HCC)   . Incontinence   . Mental disorder   . Depression   . Anxiety   . Blood transfusion 1970's  . Shortness of  breath     has had a recent increase in episodes of shortness of breath and hyperventilating  . Stroke [facility]) 2009-2010    "have had 3 strokes"; denies residual  . BPH (benign prostatic hyperplasia)   . H/O acute renal failure 08/2011    severe w/hematuria  . Cocaine abuse    Past Surgical History:  Past Surgical History  Procedure Laterality Date  . Incision and drainage of wound      right knee "got piece of wire in it while mowing"  . Inguinal hernia repair      left  . Tonsillectomy      "as a child"   HPI:  Patient is a 65 y.o. male who was admitted to the ER on 6/9 with decreased PO intake and overall functional decline, as well as concern for dehydration. PMH: CVA in April 2017 and recently moved to a SNF from inpatient rehab. CIR notes reprot pt had a severe oral dysphagia in april 2017, was consuming a thin liquid diet with pureed solids with severe oral residuals and spillage, drooling. Attention also severely impaired at that time.    Assessment / Plan / Recommendation Clinical Impression  Pt demonstrates acutely worsened severity of baseline cognitive deficits and speech intelligibilty.  In April 2017 during CIR stay pt had poor sustained attention, ability to follow some commands and speak at the word level. Today pt has intermittent focused attention, improper use of functional objects (puts everything to his mouth) which could be visual agnosia or perseveration, severely impaired intelligibilty,  only communicating in grunts. Pt did not follow any commands despite max visual, verbal and tactile cues. Likely attention, language and oral motor function are all impaired at this point, preventing attempts at functional communciation. Will continue efforts in acute setting.     SLP Assessment  Patient needs continued Speech Lanaguage Pathology Services    Follow Up Recommendations  Skilled Nursing facility    Frequency and Duration           SLP Evaluation Prior  Functioning  Cognitive/Linguistic Baseline: Baseline deficits Baseline deficit details: Pt with intelligibility goals for world level during CIR stay in April 2017.  Type of Home: Skilled Nursing Facility   Cognition  Overall Cognitive Status: Impaired/Different from baseline (history of cognitive impairments at baseline, no family ) Arousal/Alertness: Awake/alert Orientation Level: Oriented to person Attention: Focused Focused Attention: Impaired Focused Attention Impairment: Verbal basic;Functional basic    Comprehension  Auditory Comprehension Overall Auditory Comprehension: Impaired Yes/No Questions: Impaired Basic Biographical Questions: 0-25% accurate Commands: Impaired One Step Basic Commands: 0-24% accurate Interfering Components: Attention Visual Recognition/Discrimination Discrimination: Exceptions to [facility] Common Objects: Unable to indentify    Expression Verbal Expression Overall Verbal Expression: Impaired (grunted, possible "I dont know" with severe dysarthria) Common Objects: Unable to indentify Written Expression Dominant Hand: Right (but only able to use left)   Oral / Motor  Oral Motor/Sensory Function Overall Oral Motor/Sensory Function: Severe impairment Facial ROM: Reduced right Facial Symmetry: Abnormal symmetry right Facial Strength: Reduced right Facial Sensation: Reduced right Lingual ROM: Suspected CN XII (hypoglossal) dysfunction (pt does not follow commands, function indicates R weakness) Mandible: Impaired Motor Speech Overall Motor Speech: Impaired Respiration: Within functional limits Phonation: Normal Resonance:  (UTA) Articulation: Impaired Level of Impairment: Word Intelligibility: Intelligibility reduced Word: 0-24% accurate Interfering Components: Premorbid status   GO                   Harlon Ditty, MA CCC-SLP [phone]  Claudine Mouton 01/09/2016, 11:48 AM

## 2016-01-09 NOTE — CV Procedure (Signed)
Unable to perform echocardiogram due to patients altered mental status.   Dominic Pineda [facility] 01/09/16

## 2016-01-09 NOTE — Progress Notes (Signed)
    [facility] has been requested to perform a transesophageal echocardiogram on 01/09/2016 for recurrent stroke and UTI.  After careful review of history and examination, the risks and benefits of transesophageal echocardiogram have been explained including risks of esophageal damage, perforation (1:10,000 risk), bleeding, pharyngeal hematoma as well as other potential complications associated with conscious sedation including aspiration, arrhythmia, respiratory failure and death. Alternatives to treatment were discussed, questions were answered.   65 yo veteran came with in with poor oral intake and functional decline. Urine culture positive for staph aureus. Bedside TTE could not be performed due to AMS. MSR of head should subacute L MCA stroke. Patient is nonverbal with AMS, not in acute distress, however does not respond to question. I have discussed with benefit and risk of procedure with patient's daughter who is the power of attorney. She will be here tomorrow at 7:30AM to sign consent. Otherwise vital sign stable, hgb normal, platelet normal. He is NPO now after failed swallow eval.  Dominic CourseHao Deniel Mcquiston, PA-C 01/09/2016 8:47 PM

## 2016-01-09 NOTE — Clinical Documentation Improvement (Signed)
Hospitalist Neurology  Can the diagnosis of CKD be further specified in progress notes and discharge summary?   CKD Stage I - GFR greater than or equal to 90  CKD Stage II - GFR 60-89  CKD Stage III - GFR 30-59  CKD Stage IV - GFR 15-29  CKD Stage V - GFR < 15  ESRD (End Stage Renal Disease)  Other condition  Unable to clinically determine   Supporting Information:    6/10 Progress Note:  Active Problems:   4. Acute on chronic renal failure  -Lab work showing an upward trend in his creatinine 1.9 with BUN of 53. Prior to this he had a and 1.37 and BUN of 27 on 01/01/2016  -On physical examination he appears dehydrated. It appears he has had a functional decline with minimal by mouth intake.  -Provide IV fluid resuscitation.   6/11 Progress Note: Acute Problems: 4. Acute on chronic renal failure  -Lab work showing an upward trend in his creatinine 1.9 with BUN of 53. Prior to this he had a and 1.37 and BUN of 27 on 01/01/2016  -On physical examination he appears dehydrated. It appears he has had a functional decline with minimal by mouth intake.  -Repeat labs showing Cr of 1.17 with BUN of 31  -He remains NPO will continue IV fluids     Component     Latest Ref Rng 01/06/2016 01/07/2016           BUN     6 - 20 mg/dL 53 (H) 37 (H)  Creatinine     0.61 - 1.24 mg/dL 1.91 (H) 1.22      EGFR (Non-African Amer.)     >60 mL/min 35 (L) >60  EGFR (African American)     >60 mL/min 40 (L) >60   Treatments IV fluids Monitoring BMP Monitoring I&O  Please exercise your independent, professional judgment when responding. A specific answer is not anticipated or expected.   Thank You, Clayton [phone]

## 2016-01-09 NOTE — Progress Notes (Signed)
Pt is NPO unable to take PO until Modified barium swallow, MD notified

## 2016-01-09 NOTE — Progress Notes (Signed)
Call from Central Monitoring, reported a 14 run of Vtach, VSS, MD notified

## 2016-01-09 NOTE — Care Management Note (Signed)
Case Management Note  Patient Details  Name: Dominic Pineda MRN: [id number] Date of Birth: 08-23-1950  Subjective/Objective:                 Patient +CVA, workup pending, Daughter is POA per nursing staff. Patient not oriented at this time. [facility] Patient is covered 100% through [employer]. [facility]. Information will be reviewed tomorrow afternoon per CSW. Transport [place] by [facility].    Action/Plan:  Anticipate DC to [facility] IR/ SNF.  Expected Discharge Date:   (unknown)               Expected Discharge Plan:  Home w Home Health Services  In-House Referral:     Discharge planning Services  CM Consult  Post Acute Care Choice:    Choice offered to:     DME Arranged:    DME Agency:     HH Arranged:    HH Agency:     Status of Service:  In process, will continue to follow  Medicare Important Message Given:  Yes Date Medicare IM Given:    Medicare IM give by:    Date Additional Medicare IM Given:    Additional Medicare Important Message give by:     If discussed at Long Length of Stay Meetings, dates discussed:    Additional Comments:  Lawerance SabalDebbie Jesyca Weisenburger, RN 01/09/2016, 3:19 PM

## 2016-01-09 NOTE — Progress Notes (Signed)
Pt is 100% disabled veteran- family would like [place]  [facility]- they will not review until Tuesday afternoon  CS[facility] Phoebe Sharps(Kiana [phone]- [phone] ext [phone]) and made aware of referral and family preference [facility]- Phoebe SharpsKiana will assist in advocating for pt placement needs with review board  CSW will continue to follow  Merlyn LotJenna Holoman, [facility] Clinical Social Worker [phone]

## 2016-01-09 NOTE — Progress Notes (Signed)
Speech Language Pathology Treatment: Dysphagia  Patient Details Name: Dominic Pineda MRN: [id number] DOB: 10/22/1950 Today's Date: 01/09/2016 Time: [id number] SLP Time Calculation (min) (ACUTE ONLY): 26 min  Assessment / Plan / Recommendation Clinical Impression  Pt has a history of severe oral dysphaia with cognitive impairment at baseline, which has now worsened in severity due to new CVA. Pt was d/c from CIR on dys 1 (puree) diet and thin liquids, but has reportedly had very poor intake and dehydration. Today pt observed to have only briefly focused attention, impairing awareness of PO and self feeding. Pt required max to total assist for PO and was unable to follow any commands to compensate for severe oral weakness. Though pt did initiate swallows of thin and puree boluses without signs of aspiration, the majority of the bolus remained pocketed on the right or fell from mouth without pt awareness.   Pt may be capable of consuming puree/pudding with max to total assist and assistance for suctioning, but not without risk of aspiration. Intake also likely to be poor with inadequate nutritional support. Pt would benefit from discussion with MD and family as there may need to be a decision regarding alternative methods of nutrition unless plan of care changes.    HPI HPI: Patient is a 65 y.o. male who was admitted to the ER on 6/9 with decreased PO intake and overall functional decline, as well as concern for dehydration. PMH: CVA in April 2017 and recently moved to a SNF from inpatient rehab. CIR notes reprot pt had a severe oral dysphagia in april 2017, was consuming a thin liquid diet with pureed solids with severe oral residuals and spillage, drooling. Attention also severely impaired at that time.       SLP Plan  Continue with current plan of care     Recommendations  Diet recommendations: NPO Medication Administration: Via alternative means             Oral Care Recommendations: Oral  care QID Follow up Recommendations: Skilled Nursing facility Plan: Continue with current plan of care     GO               [facility] CenterBonnie Ahnesti Townsend, MA CCC-SLP [phone]  Claudine MoutonDeBlois, Aundre Hietala Caroline 01/09/2016, 11:24 AM

## 2016-01-09 NOTE — Plan of Care (Signed)
Problem: Safety: Goal: Ability to remain free from injury will improve Outcome: Not Progressing Pt is unable to follow commands.

## 2016-01-10 ENCOUNTER — Encounter (HOSPITAL_COMMUNITY): Payer: Self-pay

## 2016-01-10 ENCOUNTER — Inpatient Hospital Stay (HOSPITAL_COMMUNITY): Payer: Commercial Managed Care - HMO

## 2016-01-10 ENCOUNTER — Encounter (HOSPITAL_COMMUNITY)
Admission: EM | Disposition: A | Discharge status: Hospice | Payer: Self-pay | Source: Home / Self Care | Attending: Internal Medicine

## 2016-01-10 DIAGNOSIS — J9600 Acute respiratory failure, unspecified whether with hypoxia or hypercapnia: Secondary | ICD-10-CM

## 2016-01-10 DIAGNOSIS — G9340 Encephalopathy, unspecified: Secondary | ICD-10-CM

## 2016-01-10 DIAGNOSIS — I6789 Other cerebrovascular disease: Secondary | ICD-10-CM

## 2016-01-10 DIAGNOSIS — I639 Cerebral infarction, unspecified: Secondary | ICD-10-CM

## 2016-01-10 DIAGNOSIS — Z7189 Other specified counseling: Secondary | ICD-10-CM

## 2016-01-10 HISTORY — PX: TEE WITHOUT CARDIOVERSION: SHX5443

## 2016-01-10 LAB — ECHOCARDIOGRAM COMPLETE
E decel time: 190 msec
E/e' ratio: 12.32
FS: 29 % (ref 28–44)
Height: 70 in
IV/PV OW: 0.9
LA diam index: 1.17 cm/m2
LA vol A4C: 61 ml
LASIZE: 28 mm
LEFT ATRIUM END SYS DIAM: 28 mm
LV E/e' medial: 12.32
LVEEAVG: 12.32
LVELAT: 4.61 cm/s
LVOT area: 3.14 cm2
LVOTD: 20 mm
MV Dec: 190
MVPKAVEL: 102 m/s
MVPKEVEL: 56.8 m/s
PW: 14.6 mm — AB (ref 0.6–1.1)
TDI e' lateral: 4.61
TDI e' medial: 6.58
WEIGHTICAEL: 3939.2 [oz_av]

## 2016-01-10 LAB — ANTINUCLEAR ANTIBODIES, IFA: ANA Ab, IFA: NEGATIVE

## 2016-01-10 SURGERY — ECHOCARDIOGRAM, TRANSESOPHAGEAL
Anesthesia: Moderate Sedation

## 2016-01-10 MED ORDER — MIDAZOLAM HCL 5 MG/ML IJ SOLN
INTRAMUSCULAR | Status: AC
  Filled 2016-01-10: qty 2

## 2016-01-10 MED ORDER — LIDOCAINE VISCOUS 2 % MT SOLN
OROMUCOSAL | Status: AC
  Filled 2016-01-10: qty 30

## 2016-01-10 MED ORDER — SODIUM CHLORIDE 0.9 % IV SOLN: INTRAVENOUS | Status: DC

## 2016-01-10 MED ORDER — CEFAZOLIN SODIUM 1-5 GM-% IV SOLN
1.0000 g | Freq: Three times a day (TID) | INTRAVENOUS | Status: DC
  Administered 2016-01-10 – 2016-01-11 (×4): 1 g via INTRAVENOUS
  Filled 2016-01-10 (×6): qty 50

## 2016-01-10 MED ORDER — FENTANYL CITRATE (PF) 100 MCG/2ML IJ SOLN
INTRAMUSCULAR | Status: AC
Start: 2016-01-10 — End: 2016-01-10
  Filled 2016-01-10: qty 2

## 2016-01-10 MED ORDER — GLYCOPYRROLATE 0.2 MG/ML IJ SOLN
0.2000 mg | INTRAMUSCULAR | Status: DC
  Administered 2016-01-10 – 2016-01-11 (×5): 0.2 mg via INTRAVENOUS
  Filled 2016-01-10 (×7): qty 1

## 2016-01-10 MED ORDER — BUTAMBEN-TETRACAINE-BENZOCAINE 2-2-14 % EX AERO
INHALATION_SPRAY | CUTANEOUS | Status: DC | PRN
  Administered 2016-01-10: 2 via TOPICAL

## 2016-01-10 MED ORDER — MIDAZOLAM HCL 10 MG/2ML IJ SOLN
INTRAMUSCULAR | Status: DC | PRN
  Administered 2016-01-10: 2 mg via INTRAVENOUS
  Administered 2016-01-10: 1 mg via INTRAVENOUS

## 2016-01-10 MED ORDER — FENTANYL CITRATE (PF) 100 MCG/2ML IJ SOLN
INTRAMUSCULAR | Status: DC | PRN
  Administered 2016-01-10: 50 ug via INTRAVENOUS

## 2016-01-10 NOTE — Progress Notes (Signed)
Speech Language Pathology Treatment: Dysphagia  Patient Details Name: Dominic HoughRonald E Pineda MRN: [id number] DOB: February 09, 1951 Today's Date: 01/10/2016 Time: [id number] SLP Time Calculation (min) (ACUTE ONLY): 17 min  Assessment / Plan / Recommendation Clinical Impression  Skilled treatment session focused on addressing dysphagia education.  Upon SLP arrival daughter and ex-wife present and reported that they were present for a Palliative Care team meeting.  SLP provided education utilizing video play back and discussion regarding MBS results and recommendations.  Family was engaged and asking appropriate questions as well as expressing concerns about nutrition.  SLP recommended that family discuss these in the meeting as Palliative team arrived; as a result, session ended.  Defer trials until next session, after meeting and goals of care are defined.     HPI HPI: Patient is a 65 y.o. male who was admitted to the ER on 6/9 with decreased PO intake and overall functional decline, as well as concern for dehydration. PMH: CVA in April 2017 and recently moved to a SNF from inpatient rehab. CIR notes reprot pt had a severe oral dysphagia in april 2017, was consuming a thin liquid diet with pureed solids with severe oral residuals and spillage, drooling. Attention also severely impaired at that time.       SLP Plan  Continue with current plan of care     Recommendations  Diet recommendations: NPO Medication Administration: Via alternative means             Oral Care Recommendations: Oral care QID Follow up Recommendations: Skilled Nursing facility;24 hour supervision/assistance Plan: Continue with current plan of care     GO               Charlane FerrettiMelissa Shirin Echeverry, M.A., CCC-SLP [phone]  Lionell Matuszak 01/10/2016, 4:40 PM

## 2016-01-10 NOTE — Progress Notes (Signed)
PROGRESS NOTE    Dominic Pineda  WRU:[id number] DOB: [birth year] DOA: 01/06/2016 PCP: PROVIDER NOT IN SYSTEM    Brief Narrative:  Mr. Manzo is a 65 year old gentleman who suffered a cerebrovascular accident in April 2017, had presented with slurred speech and facial droop, imaging revealing a left corona radiata infarct, and was discharged to inpatient rehabilitation. He was discharged on 11/28/2015 from inpatient rehabilitation to skilled nursing facility. Recently he had been residing at [facility], transferred to the emergency department on 01/06/2016 having decreased by mouth intake and a functional decline. There was concern for dehydration. Initial lab work revealed an elevated creatinine of 1.91, white count of 18,300, urinalysis positive for bacteria, leukocytes and nitrates. He was given IV fluids and started on IV antibiotic therapy.   Assessment & Plan:   Active Problems:   Hypertension   CKD (chronic kidney disease)   UTI (lower urinary tract infection)   History of CVA (cerebrovascular accident)   Dysarthria, post-stroke   Acute renal failure (HCC)   Failure to thrive (0-17)   Dehydration   FTT (failure to thrive) in adult   CVA (cerebral infarction)   Pressure ulcer   Acute encephalopathy   1.  Failure to thrive/Acute encephalopthy -Mr Bearden having a history of CVA in April 2017 -He was sent over from his facility for decreased by mouth intake and functional decline -On exam he appear dehydrated with lab work showing development of acute on chronic renal failure. Urinalysis revealed the presence of urinary tract infection. -It is possible that an infectious process like UTI may have precipitated functional decline.  -Having history of recent stroke, Repeat MRI was done showed most consistent with a subacute left MCA branch vessel territory infarction with mild swelling and gyriform enhancement. - Neurology consulted and will follow the patient.  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 So palliative care consult with goals of care requested.     2.  History of CVA. -He was hospitalized in April 2017 for left corona radiata infarct -He had dysphasia post CVA. - currently NPO , suspect new stroke, plan for re eval with SLP today.  -MRI shows a sub acute infarct in the left parietal lobe. Neurology consulted, .   3.  Urinary tract infection. -Urinalysis showing many bacteria, leukocyte esterase, nitrates. -He had been on empiric IV antibiotic therapy with ceftriaxone -Urine cultures growing Staph aureus, changed antibiotic coverage to ancef.  4.  Acute on chronic renal failure -Lab work showing an upward trend in his creatinine 1.9 with BUN of 53. Prior to this he had a and 1.37 and BUN of 27 on 01/01/2016 -On physical examination he appears dehydrated. It appears he has had a functional decline with minimal by mouth intake. Improved renal parameters on IV fluids.  -He remains NPO will continue IV fluids - slp eval today and plan to encourage oral intake.  His renal function normal on 6/12.   5.  Hypernatremia. -Lab work showing sodium of 154, likely secondary to dehydration -Receiving IV fluids with 1/2 NS - RESOLVED.   6.  Hypertension. -Continue amlodipine 5 mg by mouth daily  7. Hypokalemia: replete as needed.   DVT prophylaxis: Subcutaneous heparin  PROGRESS NOTE    Dominic Pineda  WRU:[id number] DOB: [birth year] DOA: 01/06/2016 PCP: PROVIDER NOT IN SYSTEM    Brief Narrative:  Mr. Manzo is a 65 year old gentleman who suffered a cerebrovascular accident in April 2017, had presented with slurred speech and facial droop, imaging revealing a left corona radiata infarct, and was discharged to inpatient rehabilitation. He was discharged on 11/28/2015 from inpatient rehabilitation to skilled nursing facility. Recently he had been residing at [facility], transferred to the emergency department on 01/06/2016 having decreased by mouth intake and a functional decline. There was concern for dehydration. Initial lab work revealed an elevated creatinine of 1.91, white count of 18,300, urinalysis positive for bacteria, leukocytes and nitrates. He was given IV fluids and started on IV antibiotic therapy.   Assessment & Plan:   Active Problems:   Hypertension   CKD (chronic kidney disease)   UTI (lower urinary tract infection)   History of CVA (cerebrovascular accident)   Dysarthria, post-stroke   Acute renal failure (HCC)   Failure to thrive (0-17)   Dehydration   FTT (failure to thrive) in adult   CVA (cerebral infarction)   Pressure ulcer   Acute encephalopathy   1.  Failure to thrive/Acute encephalopthy -Mr Bearden having a history of CVA in April 2017 -He was sent over from his facility for decreased by mouth intake and functional decline -On exam he appear dehydrated with lab work showing development of acute on chronic renal failure. Urinalysis revealed the presence of urinary tract infection. -It is possible that an infectious process like UTI may have precipitated functional decline.  -Having history of recent stroke, Repeat MRI was done showed most consistent with a subacute left MCA branch vessel territory infarction with mild swelling and gyriform enhancement. - Neurology consulted and will follow the patient.  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 So palliative care consult with goals of care requested.     2.  History of CVA. -He was hospitalized in April 2017 for left corona radiata infarct -He had dysphasia post CVA. - currently NPO , suspect new stroke, plan for re eval with SLP today.  -MRI shows a sub acute infarct in the left parietal lobe. Neurology consulted, .   3.  Urinary tract infection. -Urinalysis showing many bacteria, leukocyte esterase, nitrates. -He had been on empiric IV antibiotic therapy with ceftriaxone -Urine cultures growing Staph aureus, changed antibiotic coverage to ancef.  4.  Acute on chronic renal failure -Lab work showing an upward trend in his creatinine 1.9 with BUN of 53. Prior to this he had a and 1.37 and BUN of 27 on 01/01/2016 -On physical examination he appears dehydrated. It appears he has had a functional decline with minimal by mouth intake. Improved renal parameters on IV fluids.  -He remains NPO will continue IV fluids - slp eval today and plan to encourage oral intake.  His renal function normal on 6/12.   5.  Hypernatremia. -Lab work showing sodium of 154, likely secondary to dehydration -Receiving IV fluids with 1/2 NS - RESOLVED.   6.  Hypertension. -Continue amlodipine 5 mg by mouth daily  7. Hypokalemia: replete as needed.   DVT prophylaxis: Subcutaneous heparin  240 hour(s))  Urine culture     Status: Abnormal   Collection Time: 01/06/16 10:52 PM  Result Value Ref Range Status   Specimen Description URINE, RANDOM  Final   Special Requests NONE  Final   Culture >=100,000 COLONIES/mL STAPHYLOCOCCUS AUREUS (A)  Final   Report Status 01/09/2016 FINAL  Final   Organism ID, Bacteria STAPHYLOCOCCUS AUREUS (A)  Final      Susceptibility   Staphylococcus aureus - MIC*    CIPROFLOXACIN >=8 RESISTANT Resistant     GENTAMICIN <=0.5 SENSITIVE Sensitive     NITROFURANTOIN <=16 SENSITIVE Sensitive     OXACILLIN 0.5 SENSITIVE Sensitive     TETRACYCLINE <=1 SENSITIVE Sensitive     VANCOMYCIN <=0.5 SENSITIVE Sensitive     TRIMETH/SULFA <=10 SENSITIVE Sensitive     CLINDAMYCIN <=0.25 RESISTANT Resistant     RIFAMPIN <=0.5 SENSITIVE Sensitive     Inducible Clindamycin POSITIVE Resistant     * >=100,000 COLONIES/mL STAPHYLOCOCCUS AUREUS  Culture, blood (Routine X 2) w Reflex to ID Panel     Status: None (Preliminary result)   Collection Time: 01/08/16  7:27 AM  Result Value Ref Range Status   Specimen Description BLOOD LEFT  ANTECUBITAL  Final   Special Requests IN PEDIATRIC BOTTLE 1CC  Final   Culture   Final    NO GROWTH < 24 HOURS Performed at [facility]    Report Status PENDING  Incomplete  Culture, blood (Routine X 2) w Reflex to ID Panel     Status: None (Preliminary result)   Collection Time: 01/08/16  8:58 AM  Result Value Ref Range Status   Specimen Description BLOOD LEFT ARM  Final   Special Requests IN PEDIATRIC BOTTLE 1.5CC  Final   Culture   Final    NO GROWTH < 24 HOURS Performed at [facility]    Report Status PENDING  Incomplete  MRSA PCR Screening     Status: None   Collection Time: 01/08/16 10:59 PM  Result Value Ref Range Status   MRSA by PCR NEGATIVE NEGATIVE Final    Comment:        The GeneXpert MRSA Assay (FDA approved for NASAL specimens only), is one component of a comprehensive MRSA colonization surveillance program. It is not intended to diagnose MRSA infection nor to guide or monitor treatment for MRSA infections.          Radiology Studies: Dg Swallowing Func-speech Pathology  01/09/2016  Objective Swallowing Evaluation: Type of Study: MBS-Modified Barium Swallow Study Patient Details Name: EMORI KAMAU MRN: [id number] Date of Birth: [birth year] Today's Date: 01/09/2016 Time: SLP Start Time (ACUTE ONLY): 1340-SLP Stop Time (ACUTE ONLY): 1357 SLP Time Calculation (min) (ACUTE ONLY): 17 min Past Medical History: Past Medical History Diagnosis Date . Hypertension  . Bipolar 1 disorder (HCC)  . Incontinence  . Mental disorder  . Depression  . Anxiety  . Blood transfusion 1970's . Shortness of breath    has had a recent increase in episodes of shortness of breath and hyperventilating . Stroke [employer]) 2009-2010   "have had 3 strokes"; denies residual . BPH (benign prostatic hyperplasia)  . H/O acute renal failure 08/2011   severe w/hematuria . Cocaine abuse  Past Surgical History: Past Surgical History Procedure Laterality Date . Incision and drainage of wound      right knee "got piece of wire in it while mowing" . Inguinal hernia repair     left . Tonsillectomy     "as  240 hour(s))  Urine culture     Status: Abnormal   Collection Time: 01/06/16 10:52 PM  Result Value Ref Range Status   Specimen Description URINE, RANDOM  Final   Special Requests NONE  Final   Culture >=100,000 COLONIES/mL STAPHYLOCOCCUS AUREUS (A)  Final   Report Status 01/09/2016 FINAL  Final   Organism ID, Bacteria STAPHYLOCOCCUS AUREUS (A)  Final      Susceptibility   Staphylococcus aureus - MIC*    CIPROFLOXACIN >=8 RESISTANT Resistant     GENTAMICIN <=0.5 SENSITIVE Sensitive     NITROFURANTOIN <=16 SENSITIVE Sensitive     OXACILLIN 0.5 SENSITIVE Sensitive     TETRACYCLINE <=1 SENSITIVE Sensitive     VANCOMYCIN <=0.5 SENSITIVE Sensitive     TRIMETH/SULFA <=10 SENSITIVE Sensitive     CLINDAMYCIN <=0.25 RESISTANT Resistant     RIFAMPIN <=0.5 SENSITIVE Sensitive     Inducible Clindamycin POSITIVE Resistant     * >=100,000 COLONIES/mL STAPHYLOCOCCUS AUREUS  Culture, blood (Routine X 2) w Reflex to ID Panel     Status: None (Preliminary result)   Collection Time: 01/08/16  7:27 AM  Result Value Ref Range Status   Specimen Description BLOOD LEFT  ANTECUBITAL  Final   Special Requests IN PEDIATRIC BOTTLE 1CC  Final   Culture   Final    NO GROWTH < 24 HOURS Performed at [facility]    Report Status PENDING  Incomplete  Culture, blood (Routine X 2) w Reflex to ID Panel     Status: None (Preliminary result)   Collection Time: 01/08/16  8:58 AM  Result Value Ref Range Status   Specimen Description BLOOD LEFT ARM  Final   Special Requests IN PEDIATRIC BOTTLE 1.5CC  Final   Culture   Final    NO GROWTH < 24 HOURS Performed at [facility]    Report Status PENDING  Incomplete  MRSA PCR Screening     Status: None   Collection Time: 01/08/16 10:59 PM  Result Value Ref Range Status   MRSA by PCR NEGATIVE NEGATIVE Final    Comment:        The GeneXpert MRSA Assay (FDA approved for NASAL specimens only), is one component of a comprehensive MRSA colonization surveillance program. It is not intended to diagnose MRSA infection nor to guide or monitor treatment for MRSA infections.          Radiology Studies: Dg Swallowing Func-speech Pathology  01/09/2016  Objective Swallowing Evaluation: Type of Study: MBS-Modified Barium Swallow Study Patient Details Name: EMORI KAMAU MRN: [id number] Date of Birth: [birth year] Today's Date: 01/09/2016 Time: SLP Start Time (ACUTE ONLY): 1340-SLP Stop Time (ACUTE ONLY): 1357 SLP Time Calculation (min) (ACUTE ONLY): 17 min Past Medical History: Past Medical History Diagnosis Date . Hypertension  . Bipolar 1 disorder (HCC)  . Incontinence  . Mental disorder  . Depression  . Anxiety  . Blood transfusion 1970's . Shortness of breath    has had a recent increase in episodes of shortness of breath and hyperventilating . Stroke [employer]) 2009-2010   "have had 3 strokes"; denies residual . BPH (benign prostatic hyperplasia)  . H/O acute renal failure 08/2011   severe w/hematuria . Cocaine abuse  Past Surgical History: Past Surgical History Procedure Laterality Date . Incision and drainage of wound      right knee "got piece of wire in it while mowing" . Inguinal hernia repair     left . Tonsillectomy     "as  240 hour(s))  Urine culture     Status: Abnormal   Collection Time: 01/06/16 10:52 PM  Result Value Ref Range Status   Specimen Description URINE, RANDOM  Final   Special Requests NONE  Final   Culture >=100,000 COLONIES/mL STAPHYLOCOCCUS AUREUS (A)  Final   Report Status 01/09/2016 FINAL  Final   Organism ID, Bacteria STAPHYLOCOCCUS AUREUS (A)  Final      Susceptibility   Staphylococcus aureus - MIC*    CIPROFLOXACIN >=8 RESISTANT Resistant     GENTAMICIN <=0.5 SENSITIVE Sensitive     NITROFURANTOIN <=16 SENSITIVE Sensitive     OXACILLIN 0.5 SENSITIVE Sensitive     TETRACYCLINE <=1 SENSITIVE Sensitive     VANCOMYCIN <=0.5 SENSITIVE Sensitive     TRIMETH/SULFA <=10 SENSITIVE Sensitive     CLINDAMYCIN <=0.25 RESISTANT Resistant     RIFAMPIN <=0.5 SENSITIVE Sensitive     Inducible Clindamycin POSITIVE Resistant     * >=100,000 COLONIES/mL STAPHYLOCOCCUS AUREUS  Culture, blood (Routine X 2) w Reflex to ID Panel     Status: None (Preliminary result)   Collection Time: 01/08/16  7:27 AM  Result Value Ref Range Status   Specimen Description BLOOD LEFT  ANTECUBITAL  Final   Special Requests IN PEDIATRIC BOTTLE 1CC  Final   Culture   Final    NO GROWTH < 24 HOURS Performed at [facility]    Report Status PENDING  Incomplete  Culture, blood (Routine X 2) w Reflex to ID Panel     Status: None (Preliminary result)   Collection Time: 01/08/16  8:58 AM  Result Value Ref Range Status   Specimen Description BLOOD LEFT ARM  Final   Special Requests IN PEDIATRIC BOTTLE 1.5CC  Final   Culture   Final    NO GROWTH < 24 HOURS Performed at [facility]    Report Status PENDING  Incomplete  MRSA PCR Screening     Status: None   Collection Time: 01/08/16 10:59 PM  Result Value Ref Range Status   MRSA by PCR NEGATIVE NEGATIVE Final    Comment:        The GeneXpert MRSA Assay (FDA approved for NASAL specimens only), is one component of a comprehensive MRSA colonization surveillance program. It is not intended to diagnose MRSA infection nor to guide or monitor treatment for MRSA infections.          Radiology Studies: Dg Swallowing Func-speech Pathology  01/09/2016  Objective Swallowing Evaluation: Type of Study: MBS-Modified Barium Swallow Study Patient Details Name: EMORI KAMAU MRN: [id number] Date of Birth: [birth year] Today's Date: 01/09/2016 Time: SLP Start Time (ACUTE ONLY): 1340-SLP Stop Time (ACUTE ONLY): 1357 SLP Time Calculation (min) (ACUTE ONLY): 17 min Past Medical History: Past Medical History Diagnosis Date . Hypertension  . Bipolar 1 disorder (HCC)  . Incontinence  . Mental disorder  . Depression  . Anxiety  . Blood transfusion 1970's . Shortness of breath    has had a recent increase in episodes of shortness of breath and hyperventilating . Stroke [employer]) 2009-2010   "have had 3 strokes"; denies residual . BPH (benign prostatic hyperplasia)  . H/O acute renal failure 08/2011   severe w/hematuria . Cocaine abuse  Past Surgical History: Past Surgical History Procedure Laterality Date . Incision and drainage of wound      right knee "got piece of wire in it while mowing" . Inguinal hernia repair     left . Tonsillectomy     "as

## 2016-01-10 NOTE — Consult Note (Signed)
Consultation Note Date: 01/10/2016   Patient Name: Dominic Pineda  DOB: 1950-08-24  MRN: [id number]  Age / Sex: 65 y.o., male  PCP: Provider Not In System Referring Physician: Kathlen Mody, MD  Reason for Consultation: Establishing goals of care, Hospice Evaluation and Non pain symptom management  HPI/Patient Profile: 65 y.o. 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 Family meeting held with his two daughters, Radiah (via [place] from [place]) [place]. 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 He now has significant dysphasia, is unable to speak, and swallows minimal amounts of food or drink. SLP recommendations are NPO with alternative administration forms of medications and nutrition. Patient is a disabled veteran, and daughters note prior to CVA's he was always concerned with "taking care of his family".    Clinical Assessment and Goals of Care:  Assessing his orientation and level of communication is difficulty d/t decreased communication. There appears to be moments of clarity where patient will grunt or nod at appropriate times, and at other times does not respond. Daughter's agree at this time primary goal is comfort. They prefer DNR status and decline feeding tube or other artificial means of nutrition. They are agreeable to comfort feedings and aware of the associated risks. Patient should be eligible for Hospice facility placement at this time with the decision for comfort feeds and estimated prognosis of <2 weeks. Will address this further at  planned family meeting on 01/11/16.  NEXT OF KIN - Daughters - Dennesha and Radiah    SUMMARY OF RECOMMENDATIONS    Code Status/Advance Care Planning:  DNR   Comfort care only  No feeding tube  Anxiety management via current medication regimen appears adequate   Symptom Management:   Comfort feedings  Secretions: Robinul every 4 hours.   Palliative Prophylaxis:   Delirium Protocol and Oral Care  Additional Recommendations (Limitations, Scope, Preferences):  Full Comfort Care, Initiate Comfort Feeding, No Artificial Feeding, No Diagnostics, No IV Fluids, No Lab Draws and No Surgical Procedures  Psycho-social/Spiritual:   Desire for further Chaplaincy support:Will offer at future meeting  Additional Recommendations: Education on Hospice  Prognosis:   < 2 weeks based on comfort measures and risks of aspiration with comfort feedings  Discharge Planning: Hospice facility recommended - family was surprised and had not considered hospice and need some time to process this recommendation     Primary Diagnoses: Present on Admission:  . Acute renal failure (HCC) . Failure to thrive (0-17) . CKD (chronic kidney disease) . Hypertension . UTI (lower urinary tract infection) . Dehydration . CVA (cerebral infarction)  I have reviewed the medical record, interviewed the patient and family, and examined the patient. The following aspects are pertinent.  Past Medical  History  Diagnosis Date  . Hypertension   . Bipolar 1 disorder (HCC)   . Incontinence   . Mental disorder   . Depression   . Anxiety   . Blood transfusion 1970's  . Shortness of breath     has had a recent increase in episodes of shortness of breath and hyperventilating  . Stroke [facility]) 2009-2010    "have had 3 strokes"; denies residual  . BPH (benign prostatic hyperplasia)   . H/O acute renal failure 08/2011    severe w/hematuria  . Cocaine abuse    Social History   Social History  . Marital  Status: Divorced    Spouse Name: N/A  . Number of Children: N/A  . Years of Education: N/A   Social History Main Topics  . Smoking status: Former Smoker -- 1.00 packs/day for 18 years    Types: Cigarettes    Quit date: 07/30/1986  . Smokeless tobacco: Never Used  . Alcohol Use: 0.0 oz/week    0 Standard drinks or equivalent per week     Comment: "quit drinking ~ 2007"  . Drug Use: No     Comment: last crack use 07/2015  . Sexual Activity: Yes   Other Topics Concern  . None   Social History Narrative   Family History  Problem Relation Age of Onset  . Coronary artery disease Father    Scheduled Meds: .  stroke: mapping our early stages of recovery book   Does not apply Once  . amLODipine  5 mg Oral Daily  . aspirin EC  325 mg Oral Daily   Or  . aspirin  300 mg Rectal Daily  . atorvastatin  40 mg Oral Daily  .  ceFAZolin (ANCEF) IV  1 g Intravenous Q8H  . clopidogrel  75 mg Oral Daily  . enoxaparin (LOVENOX) injection  40 mg Subcutaneous Q24H  . glycopyrrolate  0.2 mg Intravenous Q4H  . mineral oil-hydrophilic petrolatum   Topical Daily  . potassium chloride  40 mEq Oral Once   Continuous Infusions: . sodium chloride 100 mL/hr at 01/10/16 0608   PRN Meds:.senna-docusate Medications Prior to Admission:  Prior to Admission medications   Medication Sig Start Date End Date Taking? Authorizing Provider  aspirin EC 81 MG tablet Take 1 tablet (81 mg total) by mouth daily. Take for the next 3 months (April 14 - July 14) with Plavix 75 mg daily.  Then STOP Aspirin and take only Plavix. 11/15/15  Yes Jana Half, MD  atorvastatin (LIPITOR) 40 MG tablet Take 1 tablet (40 mg total) by mouth daily at 6 PM. Patient taking differently: Take 40 mg by mouth daily.  11/15/15  Yes Jana Half, MD  Cholecalciferol (VITAMIN D) 2000 units tablet Take 2,000 Units by mouth daily.   Yes Historical Provider, MD  clopidogrel (PLAVIX) 75 MG tablet Take 1 tablet (75 mg total) by mouth  daily. 11/15/15  Yes Jana Half, MD  hydrophilic ointment Apply 1 application topically daily. Apply to both legs for dry kin   Yes Historical Provider, MD   Allergies  Allergen Reactions  . Lactose Intolerance (Gi) Diarrhea    Stomach pain  . Lisinopril Other (See Comments)    angioedema  . Codeine Nausea Only   Review of Systems  Unable to perform ROS   Physical Exam  Constitutional: He appears well-developed and well-nourished. No distress.  HENT:  Head: Normocephalic and atraumatic.  Eyes: EOM are normal.  Pulmonary/Chest: Effort normal.  No respiratory distress.  Gurgling, combative to suctioning  Musculoskeletal: Normal range of motion.  Neurological:  Unable to assess orientation d/t aphasia  Skin: Skin is warm and dry.  Psychiatric:  aphasic, flat affect, partially follows commands    Vital Signs: BP 145/77 mmHg  Pulse 79  Temp(Src) 99.4 F (37.4 C) (Oral)  Resp 18  Ht 5\' 10"  (1.778 m)  Wt 111.676 kg (246 lb 3.2 oz)  BMI 35.33 kg/m2  SpO2 98% Pain Assessment: Faces   Pain Score: Asleep   SpO2: SpO2: 98 % O2 Device:SpO2: 98 % O2 Flow Rate: .O2 Flow Rate (L/min): 4 L/min  IO: Intake/output summary:  Intake/Output Summary (Last 24 hours) at 01/10/16 1707 Last data filed at 01/09/16 2248  Gross per 24 hour  Intake      0 ml  Output    300 ml  Net   -300 ml    LBM: Last BM Date: 01/09/16 Baseline Weight: Weight: 111.676 kg (246 lb 3.2 oz) Most recent weight: Weight: 111.676 kg (246 lb 3.2 oz)     Palliative Assessment/Data: PPS: 20%     Time In: 1600 Time Out: 1710 Time Total: Greater than 50%  of this time was spent counseling and coordinating care related to the above assessment and plan.  Signed by: Ulice Bold, NP  Ocie Bob, AGNP-C  Please contact Palliative Medicine Team phone at [phone] for questions and concerns.  For individual provider: See Loretha Stapler

## 2016-01-10 NOTE — Progress Notes (Signed)
Echocardiogram 2D Echocardiogram has been performed.  Dorothey BasemanReel, Kanyla Omeara M 01/10/2016, 12:22 PM

## 2016-01-10 NOTE — Progress Notes (Signed)
[facility]. They will offer patient an indefinite contract through [facility]. CS[facility] by [facility] social worker.   Osborne Cascoadia Jordayn Mink LCSWA [phone]

## 2016-01-10 NOTE — CV Procedure (Signed)
TRANSESOPHAGEAL ECHOCARDIOGRAM (TEE) NOTE  INDICATIONS: cryptogenic stroke  PROCEDURE:   Informed consent was obtained prior to the procedure. The risks, benefits and alternatives for the procedure were discussed and the patient comprehended these risks.  Risks include, but are not limited to, cough, sore throat, vomiting, nausea, somnolence, esophageal and stomach trauma or perforation, bleeding, low blood pressure, aspiration, pneumonia, infection, trauma to the teeth and death.    After a procedural time-out, the patient was given 3 mg versed and 50 mcg fentanyl for moderate sedation.  The patient's heart rate, blood pressure, and oxygen saturation are monitored continuously during the procedure.The oropharynx was anesthetized with 2 cetacaine sprays.  The transesophageal probe was inserted in the esophagus and stomach without difficulty and multiple views were obtained.  The patient was kept under observation until the patient left the procedure room.  The period of conscious sedation is 19 minutes, of which I was present face-to-face 100% of this time. The patient left the procedure room in stable condition.   Agitated microbubble saline contrast was administered.  COMPLICATIONS:    There were no immediate complications.  Findings:  1. LEFT VENTRICLE: The left ventricular wall thickness is moderately increased.  The left ventricular cavity is normal in size. Wall motion is normal.  LVEF is 60-65%.  2. RIGHT VENTRICLE:  The right ventricle is normal in structure and function without any thrombus or masses.    3. LEFT ATRIUM:  The left atrium is dilated in size without any thrombus or masses.  There is spontaneous echo contrast ("smoke") in the left atrium consistent with a low flow state.  4. LEFT ATRIAL APPENDAGE:  The left atrial appendage is free of any thrombus or masses. The appendage has single lobes. Pulse doppler indicates moderate flow in the appendage. The coumadin ridge  has an echolucent rim around it, but it does not appear to be thrombus (this would be an unusual location)  5. ATRIAL SEPTUM:  The atrial septum is aneurysmal and is free of thrombus and/or masses.  There is no evidence for interatrial shunting by color doppler and saline microbubble.  6. RIGHT ATRIUM:  The right atrium is normal in size and function without any thrombus or masses. There is a prominent eustachian valve.  7. MITRAL VALVE:  The mitral valve is normal in structure and function with trivial regurgitation.  There were no vegetations or stenosis.  8. AORTIC VALVE:  The aortic valve is trileaflet and sclerotic with trivial regurgitation.  There were no vegetations or stenosis  9. TRICUSPID VALVE:  The tricuspid valve is normal in structure and function with trivial regurgitation.  There were no vegetations or stenosis  10.  PULMONIC VALVE:  The pulmonic valve is normal in structure and function with no regurgitation.  There were no vegetations or stenosis.   11. AORTIC ARCH, ASCENDING AND DESCENDING AORTA:  There was grade 4 Myrtis Ser(Katz et. Al, 1992) atherosclerosis of the distal ascending aorta and proximal aortic arch with a sessile plaque noted.  12. PULMONARY VEINS: Anomalous pulmonary venous return was not noted.  13. PERICARDIUM: The pericardium appeared normal and non-thickened.  There is no pericardial effusion.  IMPRESSION:   1. No LAA thrombus 2. 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 Grade 4 sessile plaque in the proximal aortic arch 4. Aortic valve sclerosis with trivial AI 5. No clear signs of endocarditis 6. LVEF 60-65% with normal wall motion  RECOMMENDATIONS:    1. Mobile atheroma at the  proximal aortic arch, could be responsible for embolic shower. Otherwise, no cardiac source of emboli identified.  Time Spent Directly with the Patient:  45 minutes   Chrystie Nose, MD, [facility] Attending Cardiologist [facility]  01/10/2016, 9:03  AM

## 2016-01-10 NOTE — Progress Notes (Signed)
During pre-procedure, pt noted to have dried blood at corners of mouth.  After placing bite block for TEE, pt became very agitated, swinging his arms and thrashing his head.  Pt appears to have bitten tongue or lips, small amount blood present on lips.  Pt currently calm and cooperating with procedure.  Roselie AwkwardShannon Kortney Schoenfelder, RN

## 2016-01-10 NOTE — Progress Notes (Signed)
  Echocardiogram Echocardiogram Transesophageal has been performed.  Dominic Pineda, Dominic Pineda 01/10/2016, 9:01 AM

## 2016-01-10 NOTE — Progress Notes (Signed)
STROKE TEAM PROGRESS NOTE   HISTORY OF PRESENT ILLNESS (per record) MCCORMICK MACON is a 65 y.o. male with unclear time of onset who is not doing well for several days prior to being transferred here from his nursing home. On arrival, he was found to have hyponatremia, elevated creatinine and suggestion of TIA. It is felt by the internal medicine team, however, that his altered mental status seemed out of proportion to his medical problems and therefore an MRI was ordered which was performed today and shows ischemic infarct in the left parietal region. His LKW is unclear.  Patient was not administered IV t-PA secondary to unknown LKW. He was admitted for further evaluation and treatment.   SUBJECTIVE (INTERVAL HISTORY)  Patient daughter is at the bedside. She has questions about his long-term prognosis and goals of care. He plans to meet with palliative care team later today. It appears clear that patient has severe dysphagia and dysarthria and will need a feeding tube. Patient's daughter states she is not sure patient wouldn't want that all live in a nursing home. She states she cannot provide adequate care for him in her home   OBJECTIVE Temp:  [98.1 F (36.7 C)-99.4 F (37.4 C)] 99.4 F (37.4 C) (06/13 1404) Pulse Rate:  [73-92] 79 (06/13 1404) Cardiac Rhythm:  [-] Normal sinus rhythm (06/13 0840) Resp:  [13-27] 18 (06/13 1404) BP: (95-220)/(31-148) 145/77 mmHg (06/13 1404) SpO2:  [93 %-98 %] 98 % (06/13 1404)  CBC:   Recent Labs Lab 01/06/16 1253 01/08/16 0727 01/09/16 0746  WBC 18.3* 10.4 9.3  NEUTROABS 8.8*  --   --   HGB 15.4 13.1 11.7*  HCT 47.6 39.4 37.1*  MCV 90.7 89.3 88.3  PLT 260 173 163    Basic Metabolic Panel:   Recent Labs Lab 01/08/16 0727 01/09/16 0746  NA 149* 145  K 3.9 3.4*  CL 118* 114*  CO2 23 22  GLUCOSE 97 83  BUN 31* 17  CREATININE 1.17 1.04  CALCIUM 8.4* 8.5*  MG  --  1.6*    Lipid Panel:     Component Value Date/Time   CHOL 144  01/09/2016 0746   TRIG 155* 01/09/2016 0746   HDL 21* 01/09/2016 0746   CHOLHDL 6.9 01/09/2016 0746   VLDL 31 01/09/2016 0746   LDLCALC 92 01/09/2016 0746   HgbA1c:  Lab Results  Component Value Date   HGBA1C 5.5 11/12/2015   Urine Drug Screen:     Component Value Date/Time   LABOPIA NONE DETECTED 11/11/2015 1825   COCAINSCRNUR NONE DETECTED 11/11/2015 1825   LABBENZ NONE DETECTED 11/11/2015 1825   AMPHETMU NONE DETECTED 11/11/2015 1825   THCU NONE DETECTED 11/11/2015 1825   LABBARB NONE DETECTED 11/11/2015 1825      IMAGING  Dg Swallowing Func-speech Pathology  01/09/2016  Objective Swallowing Evaluation: Type of Study: MBS-Modified Barium Swallow Study Patient Details Name: MIACHEL NARDELLI MRN: [id number] Date of Birth: 24-Sep-1950 Today's Date: 01/09/2016 Time: SLP Start Time (ACUTE ONLY): 1340-SLP Stop Time (ACUTE ONLY): 1357 SLP Time Calculation (min) (ACUTE ONLY): 17 min Past Medical History: Past Medical History Diagnosis Date . Hypertension  . Bipolar 1 disorder (HCC)  . Incontinence  . Mental disorder  . Depression  . Anxiety  . Blood transfusion 1970's . Shortness of breath    has had a recent increase in episodes of shortness of breath and hyperventilating . Stroke [facility]) 2009-2010   "have had 3 strokes"; denies residual . BPH (benign prostatic  hyperplasia)  . H/O acute renal failure 08/2011   severe w/hematuria . Cocaine abuse  Past Surgical History: Past Surgical History Procedure Laterality Date . Incision and drainage of wound     right knee "got piece of wire in it while mowing" . Inguinal hernia repair     left . Tonsillectomy     "as a child" HPI: Patient is a 65 y.o. male who was admitted to the ER on 6/9 with decreased PO intake and overall functional decline, as well as concern for dehydration. PMH: CVA in April 2017 and recently moved to a SNF from inpatient rehab. CIR notes reprot pt had a severe oral dysphagia in april 2017, was consuming a thin liquid diet with pureed  solids with severe oral residuals and spillage, drooling. Attention also severely impaired at that time.  Subjective: patient's daghter (Radiah) present, patient is non-verbal, not consistently following commands, grunts and groans Assessment / Plan / Recommendation CHL IP CLINICAL IMPRESSIONS 01/09/2016 Therapy Diagnosis Severe oral phase dysphagia;Moderate pharyngeal phase dysphagia Clinical Impression Pt demonstrates severe oral and cognitive impairment affecting safety with PO. Pt requires total assist feeding; SLP placed bolus in left side of oral caivty, still with 25% anterior loss and oral residuals. Pt does transit portions of the puree and nectar thick bolus to the pharynx with a pumping lingual mechanism, but only initiates a swallow after every 3-4 bites or sips. This puts pt at very high risk of aspiration if the caregiver or staff member does not ensure pt has swallowed. Pt would need to be fed by a caregiver trained in laryngeal palpation and careful hand feeding and would need intermittent oral suction durign the meal. Aspiration risk and risk of poor nutrition would still be high. Pt would benefit from NPO status with f/u by SLP to attempt to improve automaticity with PO. Pt very likely to have poor intake due to cognitive and neuromuscular deficits. Would recommend consult to palliative care to address plan of care given potential need for alternative nutrition methods. Pt would likely not tolerate a permanent feeding tube well based on his responses to staff interventions observed today.  Impact on safety and function Severe aspiration risk;Risk for inadequate nutrition/hydration   CHL IP TREATMENT RECOMMENDATION 01/09/2016 Treatment Recommendations Therapy as outlined in treatment plan below   Prognosis 01/09/2016 Prognosis for Safe Diet Advancement Fair Barriers to Reach Goals -- Barriers/Prognosis Comment -- CHL IP DIET RECOMMENDATION 01/09/2016 SLP Diet Recommendations NPO Liquid Administration  via -- Medication Administration -- Compensations -- Postural Changes --   CHL IP OTHER RECOMMENDATIONS 01/09/2016 Recommended Consults -- Oral Care Recommendations Oral care QID Other Recommendations --   CHL IP FOLLOW UP RECOMMENDATIONS 01/09/2016 Follow up Recommendations Skilled Nursing facility   [facility] IP FREQUENCY AND DURATION 01/09/2016 Speech Therapy Frequency (ACUTE ONLY) min 2x/week Treatment Duration 2 weeks      CHL IP ORAL PHASE 01/09/2016 Oral Phase Impaired Oral - Pudding Teaspoon -- Oral - Pudding Cup -- Oral - Honey Teaspoon -- Oral - Honey Cup -- Oral - Nectar Teaspoon Left anterior bolus loss;Right anterior bolus loss;Reduced posterior propulsion;Right pocketing in lateral sulci;Left pocketing in lateral sulci;Pocketing in anterior sulcus;Lingual/palatal residue;Delayed oral transit;Decreased bolus cohesion;Premature spillage Oral - Nectar Cup Left anterior bolus loss;Right anterior bolus loss;Reduced posterior propulsion;Right pocketing in lateral sulci;Left pocketing in lateral sulci;Pocketing in anterior sulcus;Lingual/palatal residue;Delayed oral transit;Decreased bolus cohesion;Premature spillage Oral - Nectar Straw -- Oral - Thin Teaspoon -- Oral - Thin Cup -- Oral - Thin Straw --  Oral - Puree Left anterior bolus loss;Right anterior bolus loss;Reduced posterior propulsion;Right pocketing in lateral sulci;Left pocketing in lateral sulci;Pocketing in anterior sulcus;Lingual/palatal residue;Delayed oral transit;Decreased bolus cohesion;Premature spillage Oral - Mech Soft -- Oral - Regular -- Oral - Multi-Consistency -- Oral - Pill -- Oral Phase - Comment --  No flowsheet data found. No flowsheet data found. No flowsheet data found. Harlon DittyBonnie DeBlois, [place] CCC-SLP [phone] Dyanne IhaDeBlois, Riley NearingBonnie Caroline 01/09/2016, 3:45 PM                  PHYSICAL EXAM Frail african american male not in distress. . Afebrile. Head is nontraumatic. Neck is supple without bruit.    Cardiac exam no murmur or gallop. Lungs are  clear to auscultation. Distal pulses are well felt. Neurological Exam :  Awake alert but aphasic and not following commands. Slight left gaze preference but can look to the right past midline. Pupils equal reactive. Blinks to threat on the left more than right. Fundi were not visualized. He makes a few guttural sounds but does not speak words or sentences that can be understood. Does not follow even midline commands and has global aphasia. Face is symmetric. Tongue is midline. Able to move all 4 extremities against gravity but mild right hemiparesis 4/5 strength. Deep tendon reflexes are 2+ symmetric. Plantars are downgoing. Sensation and coordination cannot be reliably tested. Gait was not tested. ASSESSMENT/PLAN Mr. Wyonia HoughRonald E Kadrmas is a 65 y.o. male with history of Bipolar, hypertension, BPH, depression and anxiety presenting with poor po intake and altered mental status. He did not receive IV t-PA due to unknown time LKW.   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 No source of embolus   LDL 155  HgbA1c 5.5  Lovenox 40 mg sq daily for VTE prophylaxis Diet NPO time specified  aspirin 81 mg daily and clopidogrel 75 mg daily prior to admission, now on aspirin 300 mg suppository daily and clopidogrel 75 mg daily  Ongoing aggressive stroke risk factor management  Therapy recommendations:  SNF  Disposition:  pending  (from ALF)  Hypertension  Stable  Permissive hypertension (OK if < 220/120) but gradually normalize in 5-7 days  Long-term BP goal normotensive  Hyperlipidemia  Home meds:  lipitor 40,  resumed in hospital  LDL 155, goal < 70  Continue statin at discharge  Other Stroke Risk Factors  Advanced age  12FORMER Cigarette smoker  ETOH use, advised to drink no more than 2 drinks a day  Hx cocaine use, UDS neg this admission  obesity, Body mass index is 35.33 kg/(m^2).   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 I have made any additions or clarifications directly to the  above note. Agree with note above. He presented with altered mental status and worsening of neurological deficits I had a long discussion with the patient's daughter at the bedside regarding his prognosis and plan of care and he will likely need a feeding tube and nursing home placement as he will not be able to care for his own needs. The daughter wants to discuss this with her sister and also to meet with palliative care team before she makes a final decision about his goals of care and feeding tube patent. Greater then 50% time during this 25 minute visit was spent on coordination of care about his stroke. Discussed with Dr. Vonzell Schlatter, MD Medical Director [facility] Pager: [phone] 01/10/2016 5:40 PM     To contact Stroke Continuity provider, please refer to [email or web address]. After hours, contact General Neurology

## 2016-01-10 NOTE — H&P (Signed)
     INTERVAL PROCEDURE H&P  History and Physical Interval Note:  01/10/2016 7:49 AM  Dominic Pineda has presented today for their planned procedure. The various methods of treatment have been discussed with the patient and family. After consideration of risks, benefits and other options for treatment, the patient has consented to the procedure.  The patients' outpatient history has been reviewed, patient examined, and no change in status from most recent office note within the past 30 days. I have reviewed the patients' chart and labs and will proceed as planned. Questions were answered to the patient's satisfaction.   Dominic NoseKenneth C. Hilty, MD, [place] Attending Cardiologist [facility]  Dominic NoseKenneth C Pineda 01/10/2016, 7:49 AM

## 2016-01-10 NOTE — Progress Notes (Signed)
Palliative:  Full consult to follow. I met today with Dominic Pineda along with his 2 daughters (1 daughter over the phone) and his ex-wife. We had extensive conversation regarding decisions. Family agrees for more comfort approach, DNR, and will begin trials of comfort foods tomorrow. I also recommended hospice facility to family today for EOL care. I fear that prognosis is very poor especially once more comfort measures are implemented. He appears to be struggling with managing his own secretions  - will add robinul for secretions and medications for comfort. Family a little overwhelmed with information and we will meet tomorrow 6/14 ~0830 am to follow up on our conversation.   Vinie Sill, NP Palliative Medicine Team Pager # [phone] (M-F 8a-5p) Team Phone # [phone] (Nights/Weekends)

## 2016-01-11 ENCOUNTER — Telehealth: Payer: Self-pay | Admitting: Family Medicine

## 2016-01-11 ENCOUNTER — Encounter (HOSPITAL_COMMUNITY): Payer: Self-pay | Admitting: Internal Medicine

## 2016-01-11 DIAGNOSIS — Z789 Other specified health status: Secondary | ICD-10-CM

## 2016-01-11 DIAGNOSIS — R1310 Dysphagia, unspecified: Secondary | ICD-10-CM | POA: Diagnosis present

## 2016-01-11 DIAGNOSIS — Z7189 Other specified counseling: Secondary | ICD-10-CM | POA: Insufficient documentation

## 2016-01-11 DIAGNOSIS — Z515 Encounter for palliative care: Secondary | ICD-10-CM | POA: Insufficient documentation

## 2016-01-11 LAB — GLUCOSE, CAPILLARY: Glucose-Capillary: 91 mg/dL (ref 65–99)

## 2016-01-11 MED ORDER — AMOXICILLIN-POT CLAVULANATE 875-125 MG PO TABS: 1.0000 | ORAL_TABLET | Freq: Two times a day (BID) | ORAL | Status: AC

## 2016-01-11 MED ORDER — GLYCOPYRROLATE 0.2 MG/ML IJ SOLN
0.2000 mg | INTRAMUSCULAR | Status: DC | PRN
  Administered 2016-01-11: 0.2 mg via INTRAVENOUS
  Filled 2016-01-11 (×2): qty 1

## 2016-01-11 MED ORDER — MORPHINE SULFATE (CONCENTRATE) 10 MG/0.5ML PO SOLN: 5.0000 mg | ORAL | Status: AC | PRN

## 2016-01-11 MED ORDER — MORPHINE SULFATE (CONCENTRATE) 10 MG/0.5ML PO SOLN: 5.0000 mg | ORAL | Status: DC | PRN

## 2016-01-11 MED ORDER — LORAZEPAM 2 MG/ML PO CONC: 0.5000 mg | ORAL | Status: DC | PRN

## 2016-01-11 MED ORDER — POLYVINYL ALCOHOL 1.4 % OP SOLN
1.0000 [drp] | Freq: Four times a day (QID) | OPHTHALMIC | Status: DC | PRN
  Filled 2016-01-11: qty 15

## 2016-01-11 MED ORDER — LORAZEPAM 2 MG/ML PO CONC: 0.6000 mg | ORAL | Status: AC | PRN

## 2016-01-11 MED ORDER — BISACODYL 10 MG RE SUPP: 10.0000 mg | Freq: Every day | RECTAL | Status: DC | PRN

## 2016-01-11 MED ORDER — ONDANSETRON HCL 4 MG/2ML IJ SOLN: 4.0000 mg | Freq: Four times a day (QID) | INTRAMUSCULAR | Status: DC | PRN

## 2016-01-11 MED ORDER — BISACODYL 10 MG RE SUPP: 10.0000 mg | Freq: Every day | RECTAL | Status: AC | PRN

## 2016-01-11 MED ORDER — BIOTENE DRY MOUTH MT LIQD: 15.0000 mL | Freq: Four times a day (QID) | OROMUCOSAL | Status: DC

## 2016-01-11 MED ORDER — POLYVINYL ALCOHOL 1.4 % OP SOLN: 1.0000 [drp] | Freq: Four times a day (QID) | OPHTHALMIC | Status: AC | PRN

## 2016-01-11 MED ORDER — ONDANSETRON 4 MG PO TBDP: 4.0000 mg | ORAL_TABLET | Freq: Four times a day (QID) | ORAL | Status: DC | PRN

## 2016-01-11 MED ORDER — GLYCOPYRROLATE 0.2 MG/ML IJ SOLN: 0.2000 mg | INTRAMUSCULAR | Status: AC | PRN

## 2016-01-11 NOTE — Progress Notes (Signed)
Report given to Judeth CornfieldStephanie, [facility] All questions answered. Pt will be transferred via PTAR. Will continue to monitor pt until d/c. Pt appears comfortable at this time. Daughter is at bedside.

## 2016-01-11 NOTE — Progress Notes (Signed)
Speech Language Pathology Treatment: Dysphagia  Patient Details Name: Dominic Pineda MRN: [id number] DOB: 1950/08/16 Today's Date: 01/11/2016 Time: 0012-3935 SLP Time Calculation (min) (ACUTE ONLY): 24 min  Assessment / Plan / Recommendation Clinical Impression  Provided further education to daughter regarding comfort feeding and methods to facilitate feeding. Methods focused on providing appropriate tactile cues to allow pt to sustain attention to PO. Left an empty cup or can in his left hand, otherwise pt was unable to self feed and would reach up to grab therapist. Instructed daughter on laryngeal palpation to ensure pts swallow and dry spoon or second bite to trigger swallow. Pts labial seal was very poor and liquids spilled from his mouth significantly after sips, possibly due to sores on his lips. Intake will be minimal. Function appears to be declining.   HPI HPI: Patient is a 65 y.o. male who was admitted to the ER on 6/9 with decreased PO intake and overall functional decline, as well as concern for dehydration. PMH: CVA in April 2017 and recently moved to a SNF from inpatient rehab. CIR notes reprot pt had a severe oral dysphagia in april 2017, was consuming a thin liquid diet with pureed solids with severe oral residuals and spillage, drooling. Attention also severely impaired at that time.       SLP Plan  All goals met     Recommendations  Diet recommendations:  (comfort PO) Liquids provided via: Cup Supervision: Trained caregiver to feed patient;Full supervision/cueing for compensatory strategies;Staff to assist with self feeding Compensations: Slow rate;Small sips/bites             Oral Care Recommendations: Oral care QID Follow up Recommendations: Skilled Nursing facility;24 hour supervision/assistance Plan: All goals met     GO              Herbie Baltimore, MA CCC-SLP [phone]   Lynann Beaver 01/11/2016, 10:46 AM

## 2016-01-11 NOTE — Progress Notes (Signed)
STROKE TEAM PROGRESS NOTE   HISTORY OF PRESENT ILLNESS (per record) Dominic Pineda is a 64 y.o. male with unclear time of onset who is not doing well for several days prior to being transferred here from his nursing home. On arrival, he was found to have hyponatremia, elevated creatinine and suggestion of TIA. It is felt by the internal medicine team, however, that his altered mental status seemed out of proportion to his medical problems and therefore an MRI was ordered which was performed today and shows ischemic infarct in the left parietal region. His LKW is unclear.  Patient was not administered IV t-PA secondary to unknown LKW. He was admitted for further evaluation and treatment.   SUBJECTIVE (INTERVAL HISTORY)  Patient daughter is at the bedside. She has Made the patient comfort care after palliative care meeting. Patient is on comfort feeds and plan history transfer to hospice when bed available    OBJECTIVE Temp:  [98 F (36.7 C)-99.4 F (37.4 C)] 98.5 F (36.9 C) (06/14 0527) Pulse Rate:  [79-87] 87 (06/13 2314) Cardiac Rhythm:  [-] Normal sinus rhythm (06/14 0701) Resp:  [18-20] 20 (06/14 0527) BP: (131-145)/(51-77) 131/51 mmHg (06/14 0527) SpO2:  [98 %] 98 % (06/14 0527)  CBC:   Recent Labs Lab 01/06/16 1253 01/08/16 0727 01/09/16 0746  WBC 18.3* 10.4 9.3  NEUTROABS 8.8*  --   --   HGB 15.4 13.1 11.7*  HCT 47.6 39.4 37.1*  MCV 90.7 89.3 88.3  PLT 260 173 163    Basic Metabolic Panel:   Recent Labs Lab 01/08/16 0727 01/09/16 0746  NA 149* 145  K 3.9 3.4*  CL 118* 114*  CO2 23 22  GLUCOSE 97 83  BUN 31* 17  CREATININE 1.17 1.04  CALCIUM 8.4* 8.5*  MG  --  1.6*    Lipid Panel:     Component Value Date/Time   CHOL 144 01/09/2016 0746   TRIG 155* 01/09/2016 0746   HDL 21* 01/09/2016 0746   CHOLHDL 6.9 01/09/2016 0746   VLDL 31 01/09/2016 0746   LDLCALC 92 01/09/2016 0746   HgbA1c:  Lab Results  Component Value Date   HGBA1C 5.5 11/12/2015    Urine Drug Screen:     Component Value Date/Time   LABOPIA NONE DETECTED 11/11/2015 1825   COCAINSCRNUR NONE DETECTED 11/11/2015 1825   LABBENZ NONE DETECTED 11/11/2015 1825   AMPHETMU NONE DETECTED 11/11/2015 1825   THCU NONE DETECTED 11/11/2015 1825   LABBARB NONE DETECTED 11/11/2015 1825      IMAGING  Dg Swallowing Func-speech Pathology  01/09/2016  Objective Swallowing Evaluation: Type of Study: MBS-Modified Barium Swallow Study Patient Details Name: Dominic Pineda MRN: [id number] Date of Birth: [birth year] Today's Date: 01/09/2016 Time: SLP Start Time (ACUTE ONLY): 1340-SLP Stop Time (ACUTE ONLY): 1357 SLP Time Calculation (min) (ACUTE ONLY): 17 min Past Medical History: Past Medical History Diagnosis Date . Hypertension  . Bipolar 1 disorder (HCC)  . Incontinence  . Mental disorder  . Depression  . Anxiety  . Blood transfusion 1970's . Shortness of breath    has had a recent increase in episodes of shortness of breath and hyperventilating . Stroke [facility]) 2009-2010   "have had 3 strokes"; denies residual . BPH (benign prostatic hyperplasia)  . H/O acute renal failure 08/2011   severe w/hematuria . Cocaine abuse  Past Surgical History: Past Surgical History Procedure Laterality Date . Incision and drainage of wound     right knee "got piece of wire  in it while mowing" . Inguinal hernia repair     left . Tonsillectomy     "as a child" HPI: Patient is a 65 y.o. male who was admitted to the ER on 6/9 with decreased PO intake and overall functional decline, as well as concern for dehydration. PMH: CVA in April 2017 and recently moved to a SNF from inpatient rehab. CIR notes reprot pt had a severe oral dysphagia in april 2017, was consuming a thin liquid diet with pureed solids with severe oral residuals and spillage, drooling. Attention also severely impaired at that time.  Subjective: patient's daghter (Dominic Pineda) present, patient is non-verbal, not consistently following commands, grunts and groans  Assessment / Plan / Recommendation CHL IP CLINICAL IMPRESSIONS 01/09/2016 Therapy Diagnosis Severe oral phase dysphagia;Moderate pharyngeal phase dysphagia Clinical Impression Pt demonstrates severe oral and cognitive impairment affecting safety with PO. Pt requires total assist feeding; SLP placed bolus in left side of oral caivty, still with 25% anterior loss and oral residuals. Pt does transit portions of the puree and nectar thick bolus to the pharynx with a pumping lingual mechanism, but only initiates a swallow after every 3-4 bites or sips. This puts pt at very high risk of aspiration if the caregiver or staff member does not ensure pt has swallowed. Pt would need to be fed by a caregiver trained in laryngeal palpation and careful hand feeding and would need intermittent oral suction durign the meal. Aspiration risk and risk of poor nutrition would still be high. Pt would benefit from NPO status with f/u by SLP to attempt to improve automaticity with PO. Pt very likely to have poor intake due to cognitive and neuromuscular deficits. Would recommend consult to palliative care to address plan of care given potential need for alternative nutrition methods. Pt would likely not tolerate a permanent feeding tube well based on his responses to staff interventions observed today.  Impact on safety and function Severe aspiration risk;Risk for inadequate nutrition/hydration   CHL IP TREATMENT RECOMMENDATION 01/09/2016 Treatment Recommendations Therapy as outlined in treatment plan below   Prognosis 01/09/2016 Prognosis for Safe Diet Advancement Fair Barriers to Reach Goals -- Barriers/Prognosis Comment -- CHL IP DIET RECOMMENDATION 01/09/2016 SLP Diet Recommendations NPO Liquid Administration via -- Medication Administration -- Compensations -- Postural Changes --   CHL IP OTHER RECOMMENDATIONS 01/09/2016 Recommended Consults -- Oral Care Recommendations Oral care QID Other Recommendations --   CHL IP FOLLOW UP  RECOMMENDATIONS 01/09/2016 Follow up Recommendations Skilled Nursing facility   [facility] IP FREQUENCY AND DURATION 01/09/2016 Speech Therapy Frequency (ACUTE ONLY) min 2x/week Treatment Duration 2 weeks      CHL IP ORAL PHASE 01/09/2016 Oral Phase Impaired Oral - Pudding Teaspoon -- Oral - Pudding Cup -- Oral - Honey Teaspoon -- Oral - Honey Cup -- Oral - Nectar Teaspoon Left anterior bolus loss;Right anterior bolus loss;Reduced posterior propulsion;Right pocketing in lateral sulci;Left pocketing in lateral sulci;Pocketing in anterior sulcus;Lingual/palatal residue;Delayed oral transit;Decreased bolus cohesion;Premature spillage Oral - Nectar Cup Left anterior bolus loss;Right anterior bolus loss;Reduced posterior propulsion;Right pocketing in lateral sulci;Left pocketing in lateral sulci;Pocketing in anterior sulcus;Lingual/palatal residue;Delayed oral transit;Decreased bolus cohesion;Premature spillage Oral - Nectar Straw -- Oral - Thin Teaspoon -- Oral - Thin Cup -- Oral - Thin Straw -- Oral - Puree Left anterior bolus loss;Right anterior bolus loss;Reduced posterior propulsion;Right pocketing in lateral sulci;Left pocketing in lateral sulci;Pocketing in anterior sulcus;Lingual/palatal residue;Delayed oral transit;Decreased bolus cohesion;Premature spillage Oral - Mech Soft -- Oral - Regular -- Oral - Multi-Consistency --  Oral - Pill -- Oral Phase - Comment --  No flowsheet data found. No flowsheet data found. No flowsheet data found. Harlon Ditty, [place] CCC-SLP [phone] Dyanne Iha Riley Nearing 01/09/2016, 3:45 PM                  PHYSICAL EXAM Frail african american male not in distress. . Afebrile. Head is nontraumatic. Neck is supple without bruit.    Cardiac exam no murmur or gallop. Lungs are clear to auscultation. Distal pulses are well felt. Neurological Exam :  Awake alert but aphasic and not following commands. Slight left gaze preference but can look to the right past midline. Pupils equal reactive.  Blinks to threat on the left more than right. Fundi were not visualized. He makes a few guttural sounds but does not speak words or sentences that can be understood. Does not follow even midline commands and has global aphasia. Face is symmetric. Tongue is midline. Able to move all 4 extremities against gravity but mild right hemiparesis 4/5 strength. Deep tendon reflexes are 2+ symmetric. Plantars are downgoing. Sensation and coordination cannot be reliably tested. Gait was not tested. ASSESSMENT/PLAN Dominic Pineda is a 65 y.o. male with history of Bipolar, hypertension, BPH, depression and anxiety presenting with poor po intake and altered mental status. He did not receive IV t-PA due to unknown time LKW.   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 No source of embolus   LDL 155  HgbA1c 5.5  Lovenox 40 mg sq daily for VTE prophylaxis Diet regular Room service appropriate?: No; Fluid consistency:: Thin  aspirin 81 mg daily and clopidogrel 75 mg daily prior to admission, now on aspirin 300 mg suppository daily and clopidogrel 75 mg daily  Ongoing aggressive stroke risk factor management  Therapy recommendations:  SNF  Disposition:  pending  (from ALF)  Hypertension  Stable  Permissive hypertension (OK if < 220/120) but gradually normalize in 5-7 days  Long-term BP goal normotensive  Hyperlipidemia  Home meds:  lipitor 40, resumed in hospital  LDL 155, goal < 70  Continue statin at discharge  Other Stroke Risk Factors  Advanced age  89 Cigarette smoker  ETOH use, advised to  drink no more than 2 drinks a day  Hx cocaine use, UDS neg this admission  obesity, Body mass index is 35.33 kg/(m^2).   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 Continue comfort care measures. Stroke team will sign off. Kindly call for questions if any. Delia Heady, MD Medical Director [facility] Pager: [phone] 01/11/2016 1:37 PM     To contact Stroke Continuity provider, please  refer to [email or web address]. After hours, contact General Neurology

## 2016-01-11 NOTE — Progress Notes (Signed)
[facility] Anticipated DC date: 01/11/16 Family notified: At bedside Transport by: Sharin MonsPTAR   Per MD [facility]. RN, patient, patient's family, and facility notified of DC. RN given number for report. DC packet on chart. Ambulance transport requested for patient.   CSW signing off.  Cristobal GoldmannNadia Susen Haskew, [place] Clinical Social Worker [phone]

## 2016-01-11 NOTE — Consult Note (Signed)
[facility] CM Inpatient Consult   01/11/2016  Dominic Pineda Dec 31, 1950 [id number]   Patient screened for potential [facility] Care Management services. Patient is eligible for [facility] Services with [employer]/ [facility] ACO Regisrty.   Electronic medical record reveals patient's discharge plan is transitioning to comfort care. This is the patient's 3rd admission in 6 months.  Patient is a  65 y.o. male with past medical history of multiple CVA's resulting in cognitive deficits, HTN, CKD, anxiety, depression, and bipolar disorder, admitted on 01/06/2016 from [facility] with decreased oral intake, failure to thrive, UTI, dehydration, and overall decline in functional status.  [facility] [place] [facility] Care Management services not appropriate at this time for community as the patient and family are seeking comfort care with Hospice with the [facility] facility per social workers note. . For questions please contact:   Charlesetta Shanks, RN BSN CCM [facility]  [phone] business mobile phone Toll free office [phone]

## 2016-01-11 NOTE — Progress Notes (Signed)
Pt is now comfort care. Hand Mitts removed, Telemetry removed, and IV fluids removed. Pt made as comfortable as possible. Family is at bedside and very supportive. Comfort Care cart ordered. Will continue to monitor pt.

## 2016-01-11 NOTE — Telephone Encounter (Signed)
Dominic Pineda patient's daughter called wanting to know the status on the FMLA papers for her to take care of her father.

## 2016-01-11 NOTE — Progress Notes (Signed)
   01/11/16 1100  Clinical Encounter Type  Visited With Family;Patient and family together;Health care provider  Visit Type Initial;Psychological support;Spiritual support;Social support  Referral From Family  Consult/Referral To Chaplain;Social work;Hospice;Palliative care;Faith community  Spiritual Encounters  Spiritual Needs Emotional  Stress Factors  Family Stress Factors Exhausted;Health changes  Advance Directives (For Healthcare)  Does patient have an advance directive? Yes  Type of Advance Directive Healthcare Power of Attorney  Does patient want to make changes to advanced directive? No - Patient declined  Copy of advanced directive(s) in chart? Yes   Chaplain was page at the request of family. Family asked for some information on funeral  homes in the area, chaplain provided a package of information on funerals. Family reported wanting the main goal to be comfort and reported looking into places that could be a good fit for the Pt and his "last days". Chaplain provided emotional and spiritual care via mindful presence and prayer.   Tanja PortBeatrice M Roosevelt Bisher, Chaplain

## 2016-01-11 NOTE — Consult Note (Signed)
[place] Liaison:   Received request from [place] for family interest in [facility]. Chart reviewed and met with patient's HCPOA/dtr at bedside. Paper work completed for transfer to [employer] today.   RN please call report to [phone].  Please fax discharge summary to [phone].  Thank you. Erling Conte, [place]

## 2016-01-11 NOTE — Discharge Summary (Signed)
Physician Discharge Summary  Dominic Pineda OAI:[id number] DOB: [birth year] DOA: 01/06/2016  PCP: PROVIDER NOT IN SYSTEM  Admit date: 01/06/2016 Discharge date: 01/11/2016  Time spent: 65 minutes  Recommendations for Outpatient Follow-up:  1. Patient will be discharged to residential hospice home at [facility].   Discharge Diagnoses:  Principal Problem:   Acute encephalopathy Active Problems:   FTT (failure to thrive) in adult   Hypertension   CKD (chronic kidney disease)   UTI (lower urinary tract infection)   History of CVA (cerebrovascular accident)   Dysarthria, post-stroke   Acute renal failure (HCC)   Failure to thrive (0-17)   Dehydration   CVA (cerebral infarction)   Pressure ulcer   Goals of care, counseling/discussion   Palliative care by specialist   Dysphagia   Palliative care encounter   Discharge Condition: stable.  Diet recommendation: comfort measures.  Filed Weights   01/09/16 1208  Weight: 111.676 kg (246 lb 3.2 oz)    History of present illness:  Dr Dartha Lodge 65 year old male with history of recent CVA in April of this year and currently residing in an assisted living facility, Bipolar, hypertension, BPH, depression and anxiety. Over the last week, patient has had decreased po intake with associated altered mentation. On presentation to the hospital, patient's sodium was found to be 154 (estimated water deficit of 7.5L using assuming weight of 125 KG), BUN of 53, Scr of 1.91 (up from 1.37). UA is suggestive of UTI. No history available from patient. Patient was seen alongside patient's daughter (from [place]).  Hospital Course:  #1 acute encephalopathy/failure to thrive Patient was admitted with altered mental status and noted to have had decreased oral intake with a sodium of 154 and admission and urinalysis worrisome for a UTI. Patient was placed empirically on IV Rocephin and CT head obtained. Patient was noted to have had a history of a CVA in April  2017 and was discharged to a skilled facility. Urine cultures were obtained which came back positive for MSSA and patient subsequently changed to IV Ancef. Repeat MRI of the head which was done was consistent with a subacute left MCA branch vessel territory infarct with mild swelling and gyriform enhancement. Neurology was consulted and followed the patient throughout the hospitalization. 2-D echo which was done was negative for any cardiac source of emboli. Patient had a recent carotid Dopplers done during last hospitalization which showed right 1-39% stenosis and left 40-59% stenosis which was not significant. Patient was maintained on aspirin and Plavix for secondary stroke prevention during the hospitalization. Patient was also seen by speech therapy who felt patient was high-risk for aspiration. Palliative care was consulted and met with patient's family and goals of care were comfort measures. Patient was placed on a comfort diet. Patient was placed on Robinul for secretions, Ativan for anxiety, and Roxanol for pain and shortness of breath. Patient will be discharged to a residential hospice home.  #2 MSSA urinary tract infection Urinalysis which was done on admission was consistent with a UTI. Patient was placed empirically on IV Rocephin. Urinalysis and urine cultures were obtained with cultures growing staph aureus. Patient was transitioned from IV Rocephin to IV Ancef. Patient be discharged on 3 more days of oral Augmentin to complete a course of antibiotic treatment.  #3 acute on chronic kidney disease stage III Patient was noted initially on admission to be in acute on chronic kidney disease stage III which is felt to be secondary to prerenal azotemia secondary to  dehydration and poor oral intake. Patient was hydrated with IV fluids with resolution of his acute on chronic kidney disease stage III.  #4 history of CVA On admission patient had a CT head done an MRI of the head done which was  consistent with a subacute infarct in the left MCA branch vessel territory. Patient was seen in consultation by neurology followed the patient during the hospitalization. 2-D echo which was done was negative for any source of emboli. Patient was maintained on aspirin and Plavix for secondary stroke prevention. Patient was also maintained on a statin. Due to patient's functional decline and poor oral intake palliative care consultation was obtained who met with the family and decision was made for full comfort measures. Patient be discharged to a residential hospice home.  #5 hypernatremia Secondary to dehydration. Patient was hydrated with IV fluids with resolution of hypernatremia.  #6 dysphagia Patient was noted to have dysphagia on presentation. Patient was seen by speech therapy patient underwent a modified barium swallow and had high risk for aspiration. Patient was made nothing by mouth. Palliative care consultation was obtained and goals of care meeting was done and family decided on full comfort measures. Patient was subsequently placed on comfort feeds.  The rest of patient's chronic medical issues remained stable throughout the hospitalization.  Procedures:  MRI head with and without contrast 01/08/2016  CT head 01/07/2016  Chest x-ray 01/06/2016  Modified barium swallow 01/09/2016  Renal ultrasound 01/06/2016  2-D echo 01/10/2016    Consultations:  Neurology: Dr. Leonel Ramsay 01/08/2016   Palliative care Dr. Rowe Pavy 01/10/2016  Discharge Exam: Filed Vitals:   01/11/16 1413 01/11/16 1445  BP: 137/117 149/77  Pulse: 101 92  Temp: 98.4 F (36.9 C)   Resp: 21     General: NAD. Dysarthric speech. Cardiovascular: RRR Respiratory: CTAB anterior lung fields.  Discharge Instructions   Discharge Instructions    Diet general    Complete by:  As directed   Comfort feeds.          Current Discharge Medication List    START taking these medications   Details   amoxicillin-clavulanate (AUGMENTIN) 875-125 MG tablet Take 1 tablet by mouth 2 (two) times daily. Take for 3 days then stop. Qty: 6 tablet, Refills: 0    bisacodyl (DULCOLAX) 10 MG suppository Place 1 suppository (10 mg total) rectally daily as needed for moderate constipation. Qty: 12 suppository, Refills: 0    glycopyrrolate (ROBINUL) 0.2 MG/ML injection Inject 1 mL (0.2 mg total) into the skin every 4 (four) hours as needed (secretions). Qty: 1 mL, Refills: 0    LORazepam (ATIVAN) 2 MG/ML concentrated solution Take 0.3 mLs (0.6 mg total) by mouth every 4 (four) hours as needed for anxiety. Qty: 15 mL, Refills: 0    Morphine Sulfate (MORPHINE CONCENTRATE) 10 MG/0.5ML SOLN concentrated solution Take 0.25 mLs (5 mg total) by mouth every 2 (two) hours as needed for severe pain or shortness of breath. Qty: 30 mL, Refills: 0    polyvinyl alcohol (LIQUIFILM TEARS) 1.4 % ophthalmic solution Place 1 drop into both eyes 4 (four) times daily as needed for dry eyes. Qty: 15 mL, Refills: 0      CONTINUE these medications which have NOT CHANGED   Details  aspirin EC 81 MG tablet Take 1 tablet (81 mg total) by mouth daily. Take for the next 3 months (April 14 - July 14) with Plavix 75 mg daily.  Then STOP Aspirin and take only Plavix.  clopidogrel (PLAVIX) 75 MG tablet Take 1 tablet (75 mg total) by mouth daily.    hydrophilic ointment Apply 1 application topically daily. Apply to both legs for dry kin      STOP taking these medications     atorvastatin (LIPITOR) 40 MG tablet      Cholecalciferol (VITAMIN D) 2000 units tablet        Allergies  Allergen Reactions  . Lactose Intolerance (Gi) Diarrhea    Stomach pain  . Lisinopril Other (See Comments)    angioedema  . Codeine Nausea Only   Follow-up Information    Please follow up.   Why:  f/u with MD at [facility]       The results of significant diagnostics from this hospitalization (including imaging, microbiology,  ancillary and laboratory) are listed below for reference.    Significant Diagnostic Studies: Ct Head Wo Contrast  01/07/2016  CLINICAL DATA:  Altered mental status, acute encephalopathy l EXAM: CT HEAD WITHOUT CONTRAST TECHNIQUE: Contiguous axial images were obtained from the base of the skull through the vertex without intravenous contrast. COMPARISON:  11/24/2015 FINDINGS: Diffuse atrophy with stable ventricular dilatation. MCA infarct/ encephalomalacia on the right. No hemorrhage or extra-axial fluid. New low-attenuation in the subcortical white matter of the left posterior parietal lobe. Calvarium intact. IMPRESSION: Chronic findings. Additionally,low-attenuation in the subcortical white matter posterior left parietal lobe. Findings are nonspecific but suggest edema related to cerebritis. Infarction or mass considered less likely. Further evaluation with MRI suggested. Electronically Signed   By: Skipper Cliche M.D.   On: 01/07/2016 21:03   [name] FY Contrast  01/08/2016  CLINICAL DATA:  Abnormal CT. Altered mental status. Acute encephalopathy. EXAM: MRI HEAD WITHOUT AND WITH CONTRAST TECHNIQUE: Multiplanar, multiecho pulse sequences of the brain and surrounding structures were obtained without and with intravenous contrast. CONTRAST:  45m MULTIHANCE GADOBENATE DIMEGLUMINE 529 MG/ML IV SOLN COMPARISON:  Head CT 01/07/2016, 11/24/2015.  MRI 11/11/2015 FINDINGS: Diffusion imaging shows acute to subacute cortical and subcortical infarction in the left parietal lobe consistent with left MCA branch vessel infarction. The area of involvement shows mild swelling. There is gyriform enhancement consistent with the more subacute timing. There is mild swelling in the region. I do not see any blood products. No mass effect or shift. There is old infarction in the right MCA territory affecting the temporal lobe, insula and frontoparietal region which has progressed to atrophy, encephalomalacia and gliosis. No  acute finding in that region. There is a small old cortical infarction in the left occipital lobe. There is an old small vessel cerebellar infarction on the left. No hydrocephalus. No extra-axial collection. No evidence of neoplastic mass lesion. No pituitary mass. Sinuses are clear. Flow is present in the major vessels the base of the brain. IMPRESSION: The abnormality in the left parietal lobe at CT is most consistent with a subacute left MCA branch vessel territory infarction with mild swelling and gyriform enhancement. Old cortical and subcortical infarction in the right MCA territory and in the left occipital lobe. Electronically Signed   By: MNelson ChimesM.D.   On: 01/08/2016 14:15   [place]  01/06/2016  CLINICAL DATA:  65year old male with acute renal failure EXAM: RENAL / URINARY TRACT ULTRASOUND COMPLETE COMPARISON:  Renal ultrasound dated 09/25/2011 FINDINGS: Right Kidney: Length: 10.6 cm. There is mild diffuse renal cortical thinning. No hydronephrosis or echogenic stone. Left Kidney: Length: 10.9 cm. There is slight increased echogenicity with diffuse cortical thinning. No hydronephrosis or  echogenic stone. Bladder: The urinary bladder is only partially distended. There is thickened appearance of the posterior wall of the urinary bladder which may be related to underdistention or secondary to chronic bladder outlet obstruction versus layering debris within the urinary bladder. The prostate gland is enlarged and heterogeneous measuring 4.9 x 4.8 x 4.8 cm in transverse dimension. There is apparent median lobe hypertrophy indenting the base of the bladder. IMPRESSION: No hydronephrosis or echogenic stone. Mild renal parenchyma atrophy. Enlarged prostate gland with median lobe hypertrophy. Thickened appearance of the posterior wall of the urinary bladder. Electronically Signed   By: Anner Crete M.D.   On: 01/06/2016 21:50   Dg Chest Port 1 View  01/06/2016  CLINICAL DATA:  Weakness.  History of  stroke. EXAM: PORTABLE CHEST 1 VIEW COMPARISON:  11/25/2015 FINDINGS: Cardiomediastinal silhouette is normal. Mediastinal contours appear intact. There is no evidence of focal airspace consolidation, pleural effusion or pneumothorax. There is chronic elevation of the right hemidiaphragm and associated right lower lobe atelectasis. Osseous structures are without acute abnormality. Soft tissues are grossly normal. IMPRESSION: No active disease. Chronic right lower lobe atelectasis with elevation of the right hemidiaphragm. Electronically Signed   By: Fidela Salisbury M.D.   On: 01/06/2016 13:27   Dg Swallowing Func-speech Pathology  01/09/2016  Objective Swallowing Evaluation: Type of Study: MBS-Modified Barium Swallow Study Patient Details Name: Dominic Pineda MRN: [id number] Date of Birth: 08-15-1950 Today's Date: 01/09/2016 Time: SLP Start Time (ACUTE ONLY): 1340-SLP Stop Time (ACUTE ONLY): 1357 SLP Time Calculation (min) (ACUTE ONLY): 17 min Past Medical History: Past Medical History Diagnosis Date . Hypertension  . Bipolar 1 disorder ([place])  . Incontinence  . Mental disorder  . Depression  . Anxiety  . Blood transfusion 1970's . Shortness of breath    has had a recent increase in episodes of shortness of breath and hyperventilating . Stroke [facility]) 2009-2010   "have had 3 strokes"; denies residual . BPH (benign prostatic hyperplasia)  . H/O acute renal failure 08/2011   severe w/hematuria . Cocaine abuse  Past Surgical History: Past Surgical History Procedure Laterality Date . Incision and drainage of wound     right knee "got piece of wire in it while mowing" . Inguinal hernia repair     left . Tonsillectomy     "as a child" HPI: Patient is a 65 y.o. male who was admitted to the ER on 6/9 with decreased PO intake and overall functional decline, as well as concern for dehydration. PMH: CVA in April 2017 and recently moved to a SNF from inpatient rehab. CIR notes reprot pt had a severe oral dysphagia in april  2017, was consuming a thin liquid diet with pureed solids with severe oral residuals and spillage, drooling. Attention also severely impaired at that time.  Subjective: patient's daghter ([place]) present, patient is non-verbal, not consistently following commands, grunts and groans Assessment / Plan / Recommendation CHL IP CLINICAL IMPRESSIONS 01/09/2016 Therapy Diagnosis Severe oral phase dysphagia;Moderate pharyngeal phase dysphagia Clinical Impression Pt demonstrates severe oral and cognitive impairment affecting safety with PO. Pt requires total assist feeding; SLP placed bolus in left side of oral caivty, still with 25% anterior loss and oral residuals. Pt does transit portions of the puree and nectar thick bolus to the pharynx with a pumping lingual mechanism, but only initiates a swallow after every 3-4 bites or sips. This puts pt at very high risk of aspiration if the caregiver or staff member does not ensure pt has  swallowed. Pt would need to be fed by a caregiver trained in laryngeal palpation and careful hand feeding and would need intermittent oral suction durign the meal. Aspiration risk and risk of poor nutrition would still be high. Pt would benefit from NPO status with f/u by SLP to attempt to improve automaticity with PO. Pt very likely to have poor intake due to cognitive and neuromuscular deficits. Would recommend consult to palliative care to address plan of care given potential need for alternative nutrition methods. Pt would likely not tolerate a permanent feeding tube well based on his responses to staff interventions observed today.  Impact on safety and function Severe aspiration risk;Risk for inadequate nutrition/hydration   CHL IP TREATMENT RECOMMENDATION 01/09/2016 Treatment Recommendations Therapy as outlined in treatment plan below   Prognosis 01/09/2016 Prognosis for Safe Diet Advancement Fair Barriers to Reach Goals -- Barriers/Prognosis Comment -- CHL IP DIET RECOMMENDATION 01/09/2016  SLP Diet Recommendations NPO Liquid Administration via -- Medication Administration -- Compensations -- Postural Changes --   CHL IP OTHER RECOMMENDATIONS 01/09/2016 Recommended Consults -- Oral Care Recommendations Oral care QID Other Recommendations --   CHL IP FOLLOW UP RECOMMENDATIONS 01/09/2016 Follow up Recommendations Skilled Nursing facility   [facility] IP FREQUENCY AND DURATION 01/09/2016 Speech Therapy Frequency (ACUTE ONLY) min 2x/week Treatment Duration 2 weeks      CHL IP ORAL PHASE 01/09/2016 Oral Phase Impaired Oral - Pudding Teaspoon -- Oral - Pudding Cup -- Oral - Honey Teaspoon -- Oral - Honey Cup -- Oral - Nectar Teaspoon Left anterior bolus loss;Right anterior bolus loss;Reduced posterior propulsion;Right pocketing in lateral sulci;Left pocketing in lateral sulci;Pocketing in anterior sulcus;Lingual/palatal residue;Delayed oral transit;Decreased bolus cohesion;Premature spillage Oral - Nectar Cup Left anterior bolus loss;Right anterior bolus loss;Reduced posterior propulsion;Right pocketing in lateral sulci;Left pocketing in lateral sulci;Pocketing in anterior sulcus;Lingual/palatal residue;Delayed oral transit;Decreased bolus cohesion;Premature spillage Oral - Nectar Straw -- Oral - Thin Teaspoon -- Oral - Thin Cup -- Oral - Thin Straw -- Oral - Puree Left anterior bolus loss;Right anterior bolus loss;Reduced posterior propulsion;Right pocketing in lateral sulci;Left pocketing in lateral sulci;Pocketing in anterior sulcus;Lingual/palatal residue;Delayed oral transit;Decreased bolus cohesion;Premature spillage Oral - Mech Soft -- Oral - Regular -- Oral - Multi-Consistency -- Oral - Pill -- Oral Phase - Comment --  No flowsheet data found. No flowsheet data found. No flowsheet data found. Herbie [place], [place] CCC-SLP [phone] [place], Katherene Ponto 01/09/2016, 3:45 PM               Microbiology: Recent Results (from the past 240 hour(s))  Urine culture     Status: Abnormal   Collection Time: 01/06/16  10:52 PM  Result Value Ref Range Status   Specimen Description URINE, RANDOM  Final   Special Requests NONE  Final   Culture >=100,000 COLONIES/mL STAPHYLOCOCCUS AUREUS (A)  Final   Report Status 01/09/2016 FINAL  Final   Organism ID, Bacteria STAPHYLOCOCCUS AUREUS (A)  Final      Susceptibility   Staphylococcus aureus - MIC*    CIPROFLOXACIN >=8 RESISTANT Resistant     GENTAMICIN <=0.5 SENSITIVE Sensitive     NITROFURANTOIN <=16 SENSITIVE Sensitive     OXACILLIN 0.5 SENSITIVE Sensitive     TETRACYCLINE <=1 SENSITIVE Sensitive     VANCOMYCIN <=0.5 SENSITIVE Sensitive     TRIMETH/SULFA <=10 SENSITIVE Sensitive     CLINDAMYCIN <=0.25 RESISTANT Resistant     RIFAMPIN <=0.5 SENSITIVE Sensitive     Inducible Clindamycin POSITIVE Resistant     * >=100,000 COLONIES/mL STAPHYLOCOCCUS  AUREUS  Culture, blood (Routine X 2) w Reflex to ID Panel     Status: None (Preliminary result)   Collection Time: 01/08/16  7:27 AM  Result Value Ref Range Status   Specimen Description BLOOD LEFT ANTECUBITAL  Final   Special Requests IN PEDIATRIC BOTTLE [place]  Final   Culture   Final    NO GROWTH 2 DAYS Performed at [facility]    Report Status PENDING  Incomplete  Culture, blood (Routine X 2) w Reflex to ID Panel     Status: None (Preliminary result)   Collection Time: 01/08/16  8:58 AM  Result Value Ref Range Status   Specimen Description BLOOD LEFT ARM  Final   Special Requests IN PEDIATRIC BOTTLE 1.5CC  Final   Culture   Final    NO GROWTH 2 DAYS Performed at [facility]    Report Status PENDING  Incomplete  MRSA PCR Screening     Status: None   Collection Time: 01/08/16 10:59 PM  Result Value Ref Range Status   MRSA by PCR NEGATIVE NEGATIVE Final    Comment:        The GeneXpert MRSA Assay (FDA approved for NASAL specimens only), is one component of a comprehensive MRSA colonization surveillance program. It is not intended to diagnose MRSA infection nor to guide or monitor  treatment for MRSA infections.      Labs: Basic Metabolic Panel:  Recent Labs Lab 01/06/16 1253 01/07/16 1700 01/08/16 0727 01/09/16 0746  NA 154* 150* 149* 145  K 4.0 3.7 3.9 3.4*  CL 120* 120* 118* 114*  CO2 22 21* 23 22  GLUCOSE 128* 96 97 83  BUN 53* 37* 31* 17  CREATININE 1.91* 1.22 1.17 1.04  CALCIUM 9.5 8.5* 8.4* 8.5*  MG  --   --   --  1.6*   Liver Function Tests:  Recent Labs Lab 01/06/16 1253  AST 22  ALT 21  ALKPHOS 100  BILITOT 3.2*  PROT 8.2*  ALBUMIN 4.6   No results for input(s): LIPASE, AMYLASE in the last 168 hours. No results for input(s): AMMONIA in the last 168 hours. CBC:  Recent Labs Lab 01/06/16 1253 01/08/16 0727 01/09/16 0746  WBC 18.3* 10.4 9.3  NEUTROABS 8.8*  --   --   HGB 15.4 13.1 11.7*  HCT 47.6 39.4 37.1*  MCV 90.7 89.3 88.3  PLT 260 173 163   Cardiac Enzymes: No results for input(s): CKTOTAL, CKMB, CKMBINDEX, TROPONINI in the last 168 hours. BNP: BNP (last 3 results)  Recent Labs  11/25/15 1159  BNP 30.7    ProBNP (last 3 results) No results for input(s): PROBNP in the last 8760 hours.  CBG:  Recent Labs Lab 01/06/16 1224 01/11/16 0755  GLUCAP 116* 91       Signed:  THOMPSON,DANIEL MD.  [facility] 01/11/2016, 3:33 PM

## 2016-01-11 NOTE — Progress Notes (Signed)
Nutrition Brief Note  Chart reviewed. Pt now transitioning to comfort care.  No further nutrition interventions warranted at this time.  Please re-consult as needed.   Nnaemeka Samson A. Braylon Lemmons, RD, LDN, CDE Pager: [phone] After hours Pager: [phone]  

## 2016-01-11 NOTE — Progress Notes (Signed)
Daily Progress Note   Patient Name: Dominic Pineda       Date: 01/11/2016 DOB: Nov 28, 1950  Age: 65 y.o. MRN#: [id number] Attending Physician: Eugenie Filler, MD Primary Care Physician: PROVIDER NOT IN SYSTEM Admit Date: 01/06/2016  Reason for Consultation/Follow-up: Establishing goals of care  Subjective: We met again at the bedside of Dominic Pineda - he is sleeping most of our visit. Daughter Tamala Fothergill at bedside and daughter Radiah over facetime for conversation. They both agree with plan to proceed with comfort feeding today. They would like to see how he tolerates this today before proceeding with hospice referral although they do agree with the goals we have discussed that this would be the preference over SNF. Also to keep him in [place] closer to [place] so family can visit with him as much as possible during this time. Discussed addition of medications for comfort and will continue antibiotics for UTI as comfort (although if he looses/pulls out IV we should not replace and stick him again). Family also asks questions about funeral arrangements as he had no preparations. They do recall a family member that works in funeral home that can be a Theatre manager for them. Discussed basics of burial and cremation with them. Also consulting chaplain for further support.   Length of Stay: 5  Current Medications: Scheduled Meds:  .  stroke: mapping our early stages of recovery book   Does not apply Once  . antiseptic oral rinse  15 mL Topical QID  . aspirin  300 mg Rectal Daily  .  ceFAZolin (ANCEF) IV  1 g Intravenous Q8H  . glycopyrrolate  0.2 mg Intravenous Q4H  . mineral oil-hydrophilic petrolatum   Topical Daily    Continuous Infusions: . sodium chloride 100 mL/hr at 01/11/16 0111    PRN  Meds: bisacodyl, LORazepam, morphine CONCENTRATE, ondansetron **OR** ondansetron (ZOFRAN) IV, polyvinyl alcohol  Physical Exam  Constitutional: He appears well-developed. He appears lethargic.  HENT:  Head: Normocephalic and atraumatic.  Cardiovascular: Normal rate and regular rhythm.   Pulmonary/Chest: Effort normal and breath sounds normal. No accessory muscle usage. No tachypnea. No respiratory distress.  Abdominal: Soft. Normal appearance.  Neurological: He appears lethargic.  aphasic            Vital Signs: BP 131/51 mmHg  Pulse 87  Temp(Src) 98.5  F (36.9 C) (Oral)  Resp 20  Ht '5\' 10"'$  (1.778 m)  Wt 111.676 kg (246 lb 3.2 oz)  BMI 35.33 kg/m2  SpO2 98% SpO2: SpO2: 98 % O2 Device: O2 Device: Not Delivered O2 Flow Rate: O2 Flow Rate (L/min): 4 L/min  Intake/output summary:  Intake/Output Summary (Last 24 hours) at 01/11/16 1007 Last data filed at 01/11/16 0550  Gross per 24 hour  Intake 4711.66 ml  Output    200 ml  Net 4511.66 ml   LBM: Last BM Date: 01/10/16 Baseline Weight: Weight: 111.676 kg (246 lb 3.2 oz) Most recent weight: Weight: 111.676 kg (246 lb 3.2 oz)       Palliative Assessment/Data: PPS: 20%   Flowsheet Rows        Most Recent Value   Intake Tab    Referral Department  Hospitalist   Unit at Time of Referral  Other (Comment) [[facility]]   Palliative Care Primary Diagnosis  Neurology   Date Notified  01/10/16   Palliative Care Type  New Palliative care   Reason for referral  Clarify Goals of Care, Counsel Regarding Hospice, Advance Care Planning   Date of Admission  01/06/16   Date first seen by Palliative Care  01/11/16   # of days Palliative referral response time  1 Day(s)   # of days IP prior to Palliative referral  4   Clinical Assessment    Palliative Performance Scale Score  20%   Pain Max last 24 hours  Not able to report   Anxiety Max Last 24 Hours  Not able to report   Psychosocial & Spiritual Assessment    Social Work 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  Yes   Who was at the meeting?  Daughters- Katherina Right and Radiah   Palliative Care Outcomes  Clarified goals of care, Changed to focus on comfort, Counseled regarding hospice, Provided psychosocial or spiritual support, Provided advance care planning, Changed CPR status, Provided end of life care assistance   Patient/Family wishes: Interventions discontinued/not started   Mechanical Ventilation, BiPAP, Tube feedings/TPN, PEG, Trach, NIPPV, Hemodialysis, Transfusion   Palliative Care follow-up planned  Yes, Facility      Patient Active Problem List   Diagnosis Date Noted  . Goals of care, counseling/discussion   . Palliative care by specialist   . Dysphagia   . Pressure ulcer 01/09/2016  . Acute encephalopathy   . CVA (cerebral infarction) 01/08/2016  . Dehydration 01/07/2016  . FTT (failure to thrive) in adult 01/07/2016  . Acute renal failure ([place]) 01/06/2016  . Failure to thrive (0-17) 01/06/2016  . 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 Cognitive deficit, post-stroke 12/09/2015  . Major neurocognitive disorder, due to vascular disease, without behavioral disturbance, mild   . CVA (cerebral vascular accident) ([place]) 11/15/2015  . Essential hypertension   . Hemiparesis affecting left side as late effect of stroke ([place])   . Bipolar affective disorder in remission ([place])   . Benign essential HTN   . History of CVA (cerebrovascular accident)   . Acute blood loss anemia   . AKI (acute kidney injury) ([place])   . Acute cerebrovascular accident (CVA) ([place]) 11/12/2015  . Substance abuse   . Severe hypertension 11/11/2015  . Stroke ([place])   . Urinary tract infectious disease   . Facial droop 10/30/2011  . CKD (chronic kidney disease)  10/30/2011  . UTI (lower urinary tract infection) 10/30/2011  .  Obstructive uropathy 09/28/2011  . ARF (acute renal failure) ([place]) 09/23/2011  . Diarrhea 09/23/2011  . Nausea & vomiting 09/23/2011  . Hyperkalemia 09/23/2011  . Polyuria 09/23/2011  . Hypertensive emergency 09/23/2011  . Bipolar 1 disorder ([place]) 09/23/2011  . Shuffling gait 09/23/2011  . Weakness generalized 09/23/2011  . H/O cocaine abuse 09/23/2011  . Hypertension   . Incontinence     Palliative Care Assessment & Plan   Patient Profile: 65 y.o. male with past medical history of multiple CVA's resulting in cognitive deficits, HTN, CKD, anxiety, depression, and bipolar disorder, admitted on 01/06/2016 from [facility] with decreased oral intake, failure to thrive, UTI, dehydration, and overall decline in functional status. Family meeting held with his two daughters, Radiah (via [place] from [place]) [place]. 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 He now has significant dysphasia, is unable to speak, and swallows minimal amounts of food or drink. SLP recommendations are NPO with alternative administration forms of medications and nutrition. Patient is a disabled veteran, and daughters note prior to CVA's he was always concerned with "taking care of his family".    Assessment: Mr. Gazda is more lethargic today.   Recommendations/Plan:  Secretions: Improved. Continue Robinul prn.   Anxiety: Ativan SL 0.5 mg every 4 hours prn.   Dyspnea/SOB/pain: Roxanol 5 mg every 2 hours prn.   Goals of Care and Additional Recommendations:  Limitations on Scope of Treatment: Full Comfort Care  Code Status:    Code Status Orders        Start     Ordered   01/11/16 1005  Do not attempt resuscitation (DNR)   Continuous    Question Answer Comment  In the event of cardiac or respiratory ARREST Do not call a "code blue"   In the event of cardiac or  respiratory ARREST Do not perform Intubation, CPR, defibrillation or ACLS   In the event of cardiac or respiratory ARREST Use medication by any route, position, wound care, and other measures to relive pain and suffering. May use oxygen, suction and manual treatment of airway obstruction as needed for comfort.      01/11/16 1006    Code Status History    Date Active Date Inactive Code Status Order ID Comments User Context   01/10/2016  5:03 PM 01/11/2016 10:06 AM DNR [id number]  Pershing Proud, NP Inpatient   01/08/2016  2:48 PM 01/10/2016  5:03 PM Full Code [id number]  Kelvin Cellar, MD Inpatient   01/06/2016  5:59 PM 01/08/2016  2:48 PM Full Code [id number]  Bonnell Public, MD Inpatient   11/15/2015  4:44 PM 11/28/2015  1:27 PM Full Code [id number]  Cathlyn Parsons, PA-C Inpatient   11/11/2015 10:22 PM 11/15/2015  4:44 PM Full Code [id number]  Milagros Loll, MD ED   09/08/2013 11:42 PM 09/09/2013  7:23 AM Full Code [id number]  Delora Fuel, MD ED   9/40/7680  1:34 AM 09/28/2011  7:38 PM Full Code [id number]  Theressa Millard, MD ED    Advance Directive Documentation        Most Recent Value   Type of Advance Directive  Healthcare Power of Attorney   Pre-existing out of facility DNR order (yellow form or pink MOST form)     "MOST" Form in Place?  Prognosis:   < 2 weeks  Discharge Planning:  Hospice facility   Thank you for allowing the Palliative Medicine Team to assist in the care of this patient.   Time In: 0930 Time Out: 1005 Total Time 42mn Prolonged Time Billed  no       Greater than 50%  of this time was spent counseling and coordinating care related to the above assessment and plan.  PPershing Proud NP  Please contact Palliative Medicine Team phone at [phone] questions and concerns.

## 2016-01-12 NOTE — Telephone Encounter (Signed)
Please call daughter and let her know they are ready for pick up. Please and Thank you

## 2016-01-13 LAB — CULTURE, BLOOD (ROUTINE X 2)
CULTURE: NO GROWTH
CULTURE: NO GROWTH

## 2016-01-28 DEATH — deceased

## 2016-02-04 DIAGNOSIS — I159 Secondary hypertension, unspecified: Secondary | ICD-10-CM | POA: Insufficient documentation

## 2016-02-29 ENCOUNTER — Ambulatory Visit: Payer: Commercial Managed Care - HMO | Admitting: Gastroenterology

## 2018-03-28 IMAGING — CT CT HEAD W/O CM
3 of 4 series · 18 of 30 positions shown, 19 images · non-contrast
Comparison: 11/18/2015

CLINICAL DATA: Dysphagia. History of hypertension and mental
disorder. Previous stroke.

EXAM:
CT HEAD WITHOUT CONTRAST
TECHNIQUE: Contiguous axial images were obtained from the base of the skull
through the vertex without intravenous contrast.

[Series 3: head bone · axial · 0.45mm/px · z∈[-134,+8]mm · 8 of 89 slices shown]
[im 9/89  bone]
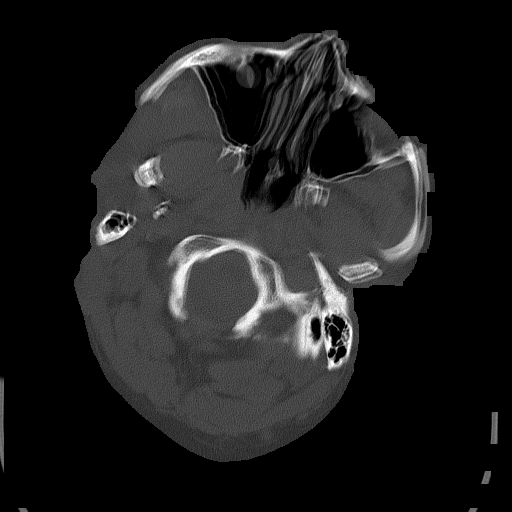
[im 18/89  bone]
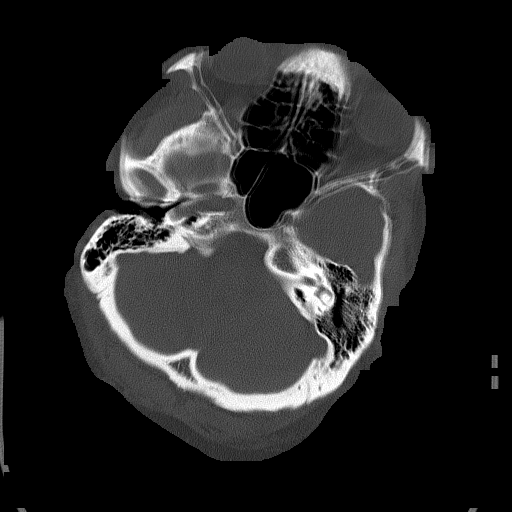
[im 27/89  bone]
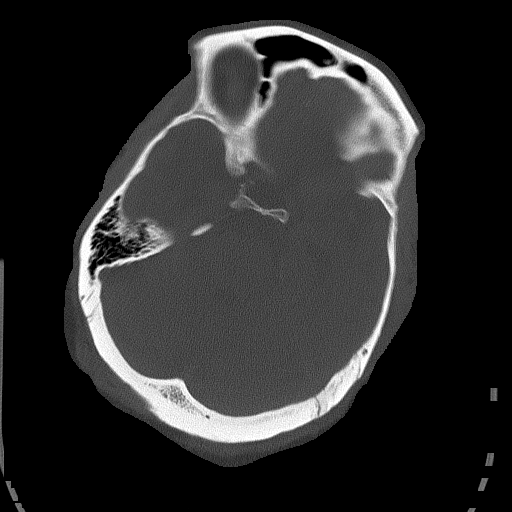
[im 36/89  bone]
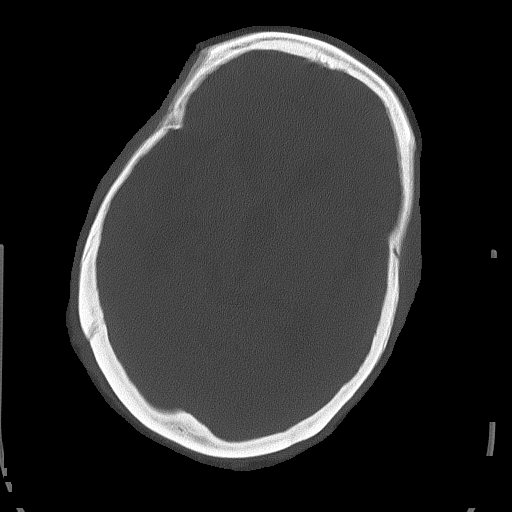
[im 53/89  bone]
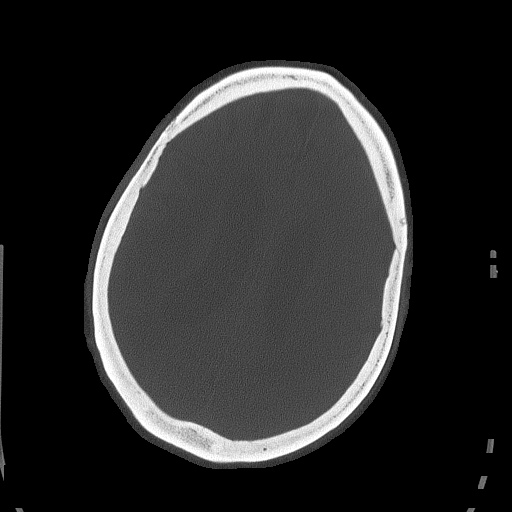
[im 62/89  bone]
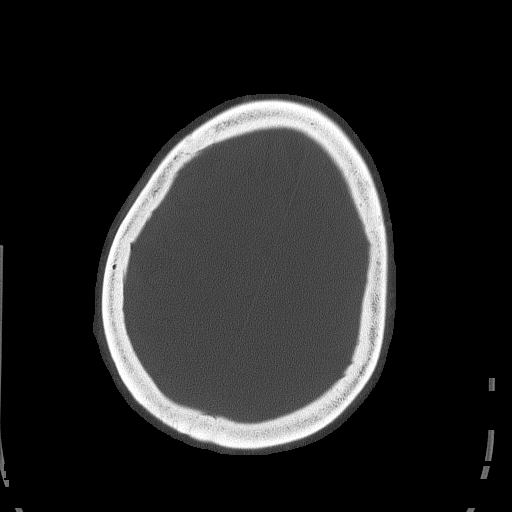
[im 71/89  bone]
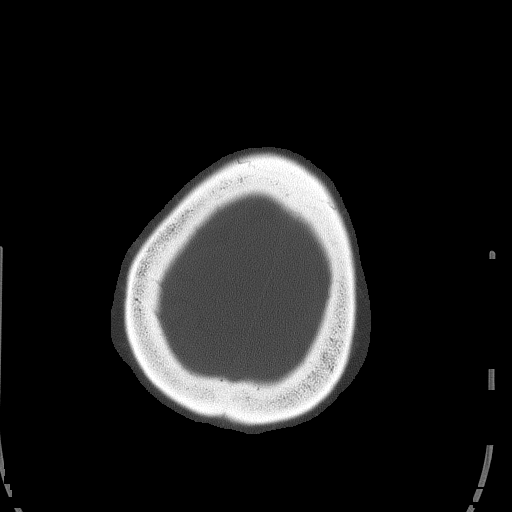
[im 80/89  bone]
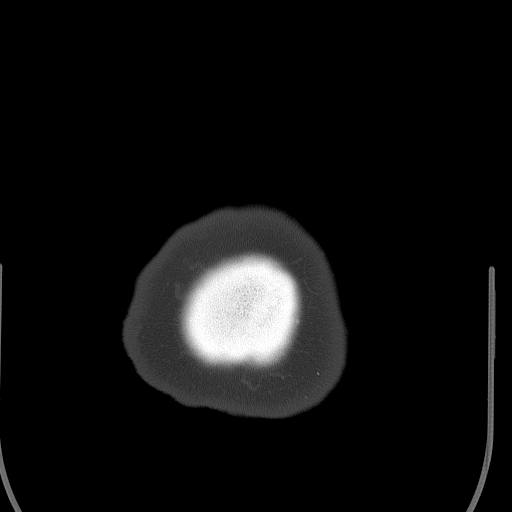

[Series 4: head without ax · axial · non-contrast · 0.39mm/px · z∈[-100,-7]mm · 3 of 39 slices shown, 4 images]
[im 10/39  brain]
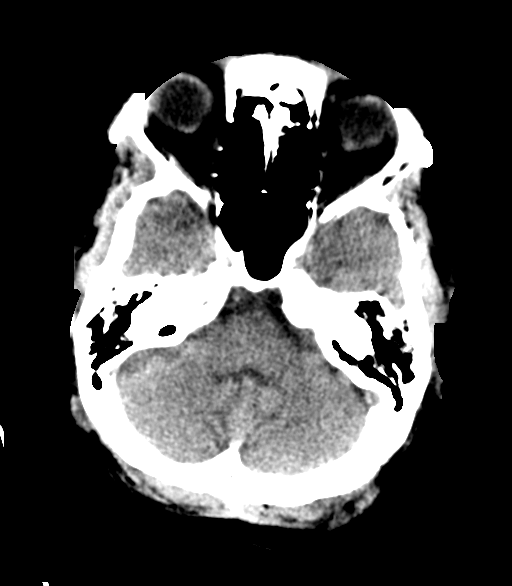
[im 10/39  bone]
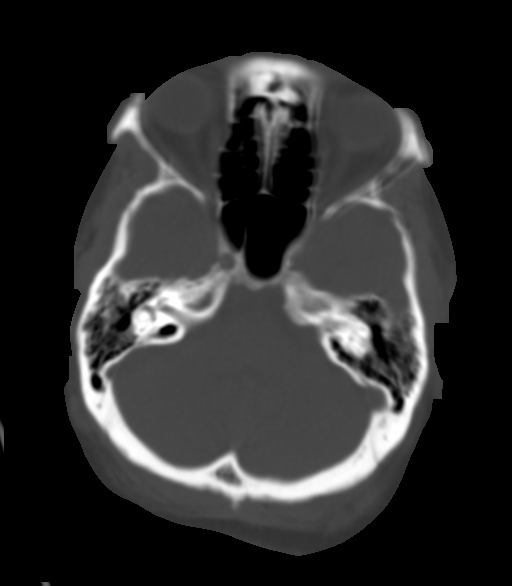
[im 20/39  brain]
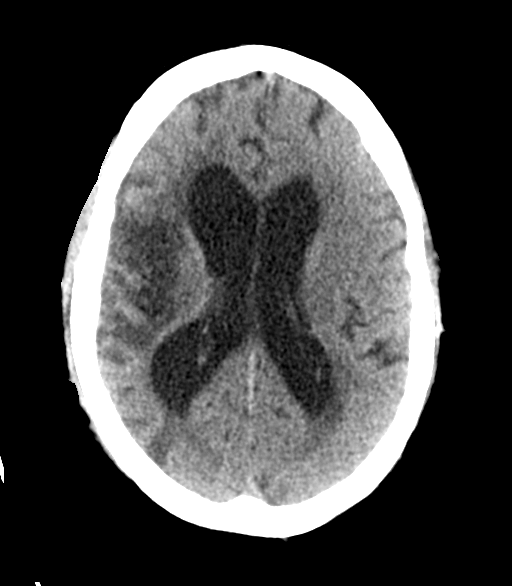
[im 29/39  brain]
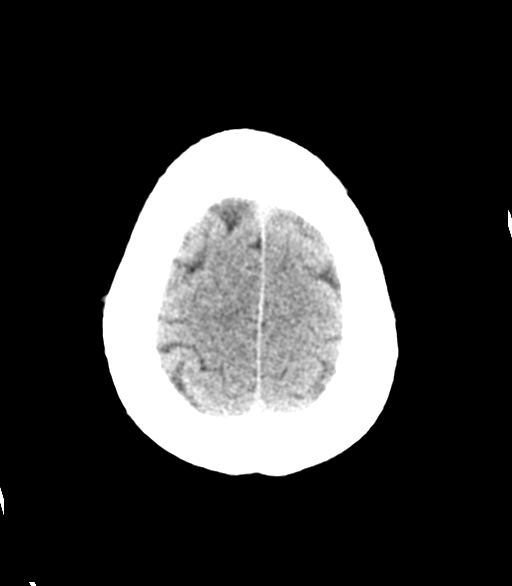

[Series 5: ax head bone · axial · 0.45mm/px · z∈[-131,-9]mm · 7 of 89 slices shown]
[im 9/89  bone]
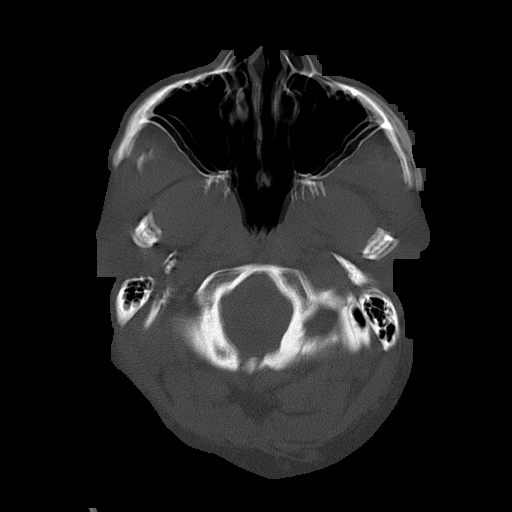
[im 18/89  bone]
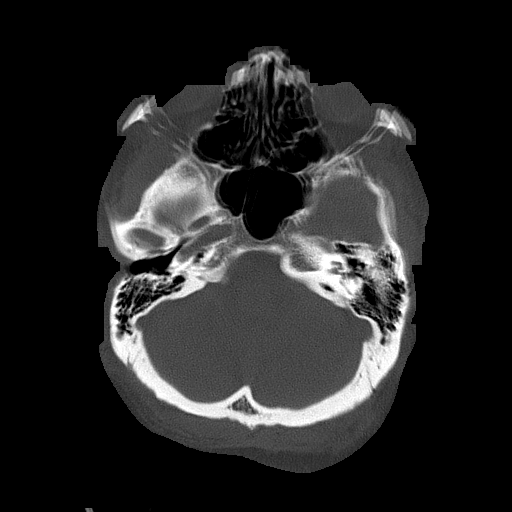
[im 27/89  bone]
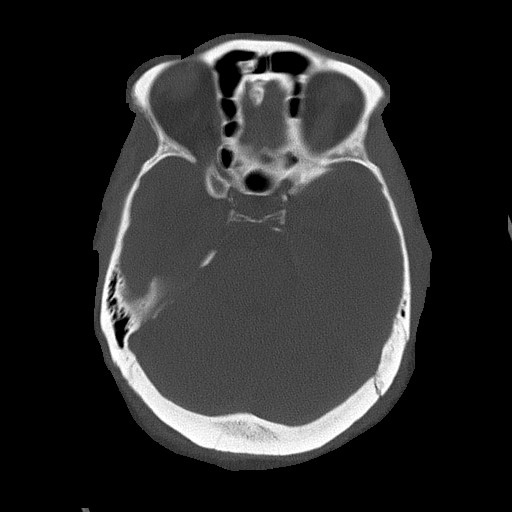
[im 36/89  bone]
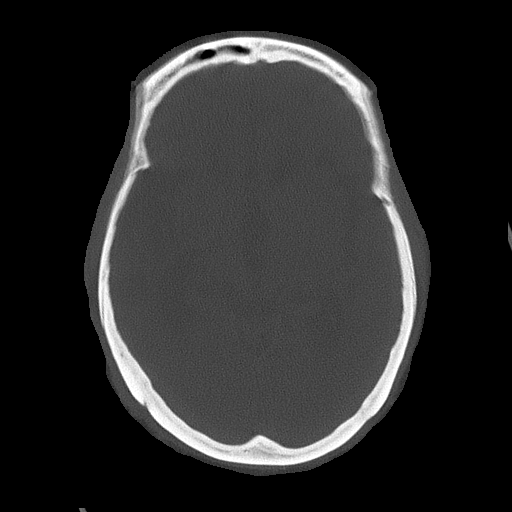
[im 53/89  bone]
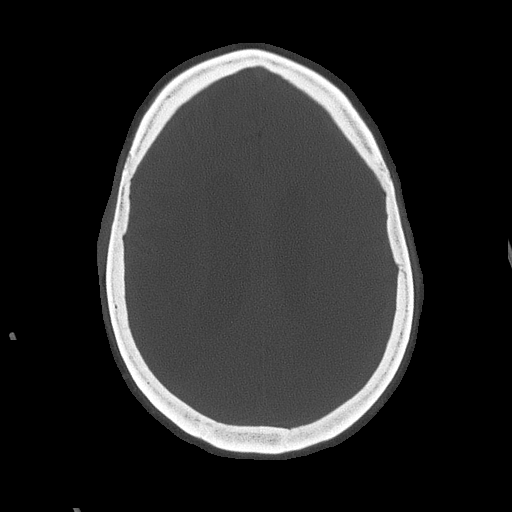
[im 62/89  bone]
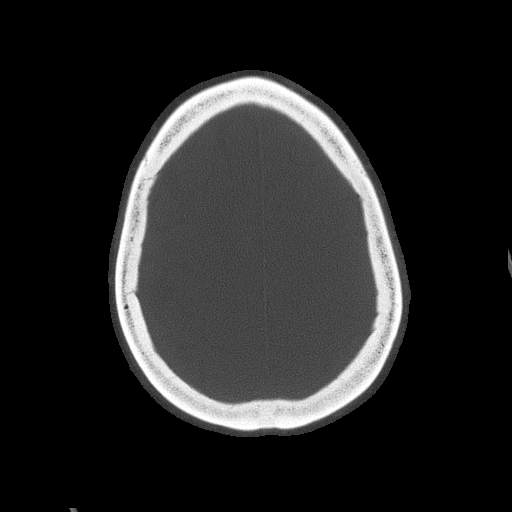
[im 71/89  bone]
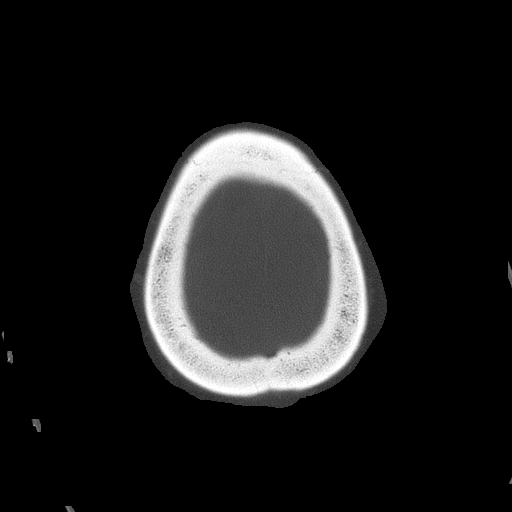

[18 of 30 positions shown; findings below may reference images not displayed]

FINDINGS: Ventricles are normal configuration. There is ventricular
enlargement, greater than sulcal enlargement, reflecting moderate
atrophy, advanced for age. This is stable from the prior study.

There are no parenchymal masses or mass effect. There is an old
right middle cerebral artery distribution infarct with associated
encephalomalacia, stable from the prior study. There is no evidence
of a recent cortical infarct. Additional spot areas of white matter
hypoattenuation noted consistent chronic microvascular ischemic
change.

There are no extra-axial masses or abnormal fluid collections.

There is no intracranial hemorrhage.

Visualized sinuses and mastoid air cells are clear. No skull lesion.
IMPRESSION: 1. No acute intracranial abnormalities.
2. Stable old right MCA distribution infarct.
3. Moderate atrophy.  Mild chronic microvascular ischemic change.

## 2018-03-29 IMAGING — CR DG CHEST 1V PORT
1 series · 1 of 1 positions shown · non-contrast
Comparison: 11/21/2015

CLINICAL DATA: Altered mental status

EXAM:
PORTABLE CHEST 1 VIEW

[AP]
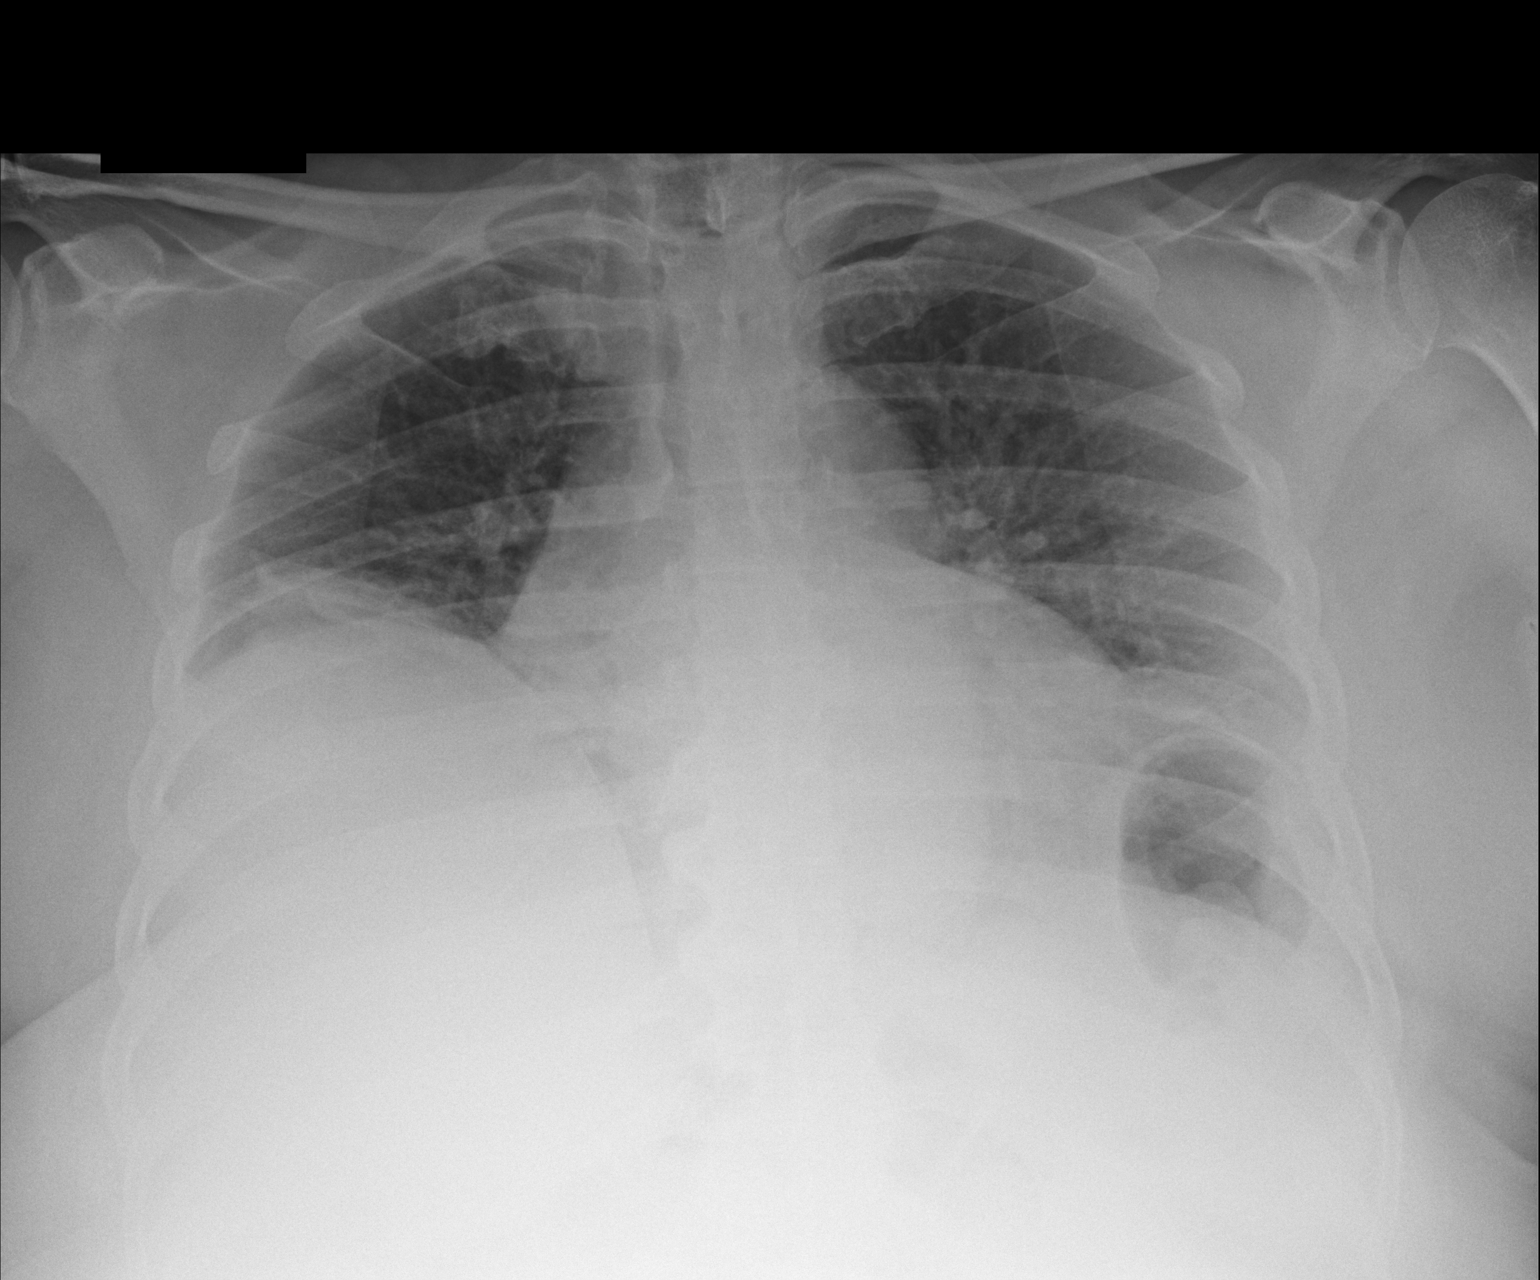

[1 of 1 positions shown; findings below may reference images not displayed]

FINDINGS: Cardiomegaly. Very low lung volumes with bibasilar atelectasis and
vascular congestion. No effusions or acute bony abnormality.
IMPRESSION: Very low lung volumes with bibasilar atelectasis.

Cardiomegaly with vascular congestion.
